# Patient Record
Sex: Male | Born: 1960 | Race: Black or African American | Hispanic: No | Marital: Married | Smoking: Current some day smoker
Health system: Southern US, Community
[De-identification: ages and names within clinical notes are randomized; demographics above are authoritative.]

## PROBLEM LIST (undated history)

## (undated) DIAGNOSIS — I739 Peripheral vascular disease, unspecified: Secondary | ICD-10-CM

## (undated) DIAGNOSIS — T8859XA Other complications of anesthesia, initial encounter: Secondary | ICD-10-CM

## (undated) DIAGNOSIS — R112 Nausea with vomiting, unspecified: Secondary | ICD-10-CM

## (undated) DIAGNOSIS — Z9889 Other specified postprocedural states: Secondary | ICD-10-CM

## (undated) DIAGNOSIS — I10 Essential (primary) hypertension: Secondary | ICD-10-CM

## (undated) DIAGNOSIS — I2510 Atherosclerotic heart disease of native coronary artery without angina pectoris: Secondary | ICD-10-CM

## (undated) DIAGNOSIS — G43909 Migraine, unspecified, not intractable, without status migrainosus: Secondary | ICD-10-CM

## (undated) DIAGNOSIS — E7800 Pure hypercholesterolemia, unspecified: Secondary | ICD-10-CM

## (undated) DIAGNOSIS — E785 Hyperlipidemia, unspecified: Secondary | ICD-10-CM

## (undated) HISTORY — DX: Hyperlipidemia, unspecified: E78.5

## (undated) HISTORY — DX: Essential (primary) hypertension: I10

---

## 2000-11-26 HISTORY — PX: CORONARY ARTERY BYPASS GRAFT: SHX141

## 2001-01-24 ENCOUNTER — Encounter: Payer: Self-pay | Admitting: Cardiology

## 2001-01-24 ENCOUNTER — Encounter: Admission: RE | Admit: 2001-01-24 | Discharge: 2001-01-24 | Payer: Self-pay | Admitting: Cardiology

## 2001-01-28 ENCOUNTER — Inpatient Hospital Stay (HOSPITAL_COMMUNITY): Admission: RE | Admit: 2001-01-28 | Discharge: 2001-02-02 | Payer: Self-pay | Admitting: Cardiology

## 2001-01-29 ENCOUNTER — Encounter: Payer: Self-pay | Admitting: Cardiothoracic Surgery

## 2001-01-30 ENCOUNTER — Encounter: Payer: Self-pay | Admitting: Cardiothoracic Surgery

## 2001-01-31 ENCOUNTER — Encounter: Payer: Self-pay | Admitting: Cardiothoracic Surgery

## 2001-02-28 ENCOUNTER — Encounter: Admission: RE | Admit: 2001-02-28 | Discharge: 2001-02-28 | Payer: Self-pay | Admitting: Cardiothoracic Surgery

## 2001-02-28 ENCOUNTER — Encounter: Payer: Self-pay | Admitting: Cardiothoracic Surgery

## 2001-04-14 ENCOUNTER — Encounter: Admission: RE | Admit: 2001-04-14 | Discharge: 2001-04-14 | Payer: Self-pay

## 2008-11-26 HISTORY — PX: ACHILLES TENDON REPAIR: SUR1153

## 2015-11-01 DIAGNOSIS — N529 Male erectile dysfunction, unspecified: Secondary | ICD-10-CM | POA: Insufficient documentation

## 2015-11-01 DIAGNOSIS — I2510 Atherosclerotic heart disease of native coronary artery without angina pectoris: Secondary | ICD-10-CM | POA: Insufficient documentation

## 2015-11-01 DIAGNOSIS — Z951 Presence of aortocoronary bypass graft: Secondary | ICD-10-CM

## 2015-11-01 DIAGNOSIS — E785 Hyperlipidemia, unspecified: Secondary | ICD-10-CM

## 2015-11-01 HISTORY — DX: Male erectile dysfunction, unspecified: N52.9

## 2015-11-01 HISTORY — DX: Presence of aortocoronary bypass graft: Z95.1

## 2015-11-01 HISTORY — DX: Atherosclerotic heart disease of native coronary artery without angina pectoris: I25.10

## 2015-11-01 HISTORY — DX: Hyperlipidemia, unspecified: E78.5

## 2017-06-24 ENCOUNTER — Ambulatory Visit (INDEPENDENT_AMBULATORY_CARE_PROVIDER_SITE_OTHER): Payer: Self-pay | Admitting: Cardiology

## 2017-06-24 ENCOUNTER — Encounter: Payer: Self-pay | Admitting: Cardiology

## 2017-06-24 VITALS — BP 126/76 | HR 60 | Resp 12 | Ht 71.0 in | Wt 212.0 lb

## 2017-06-24 DIAGNOSIS — N529 Male erectile dysfunction, unspecified: Secondary | ICD-10-CM | POA: Diagnosis not present

## 2017-06-24 DIAGNOSIS — I2510 Atherosclerotic heart disease of native coronary artery without angina pectoris: Secondary | ICD-10-CM

## 2017-06-24 DIAGNOSIS — Z951 Presence of aortocoronary bypass graft: Secondary | ICD-10-CM

## 2017-06-24 DIAGNOSIS — E785 Hyperlipidemia, unspecified: Secondary | ICD-10-CM

## 2017-06-24 NOTE — Patient Instructions (Signed)
Medication Instructions:  Your physician recommends that you continue on your current medications as directed. Please refer to the Current Medication list given to you today.  Labwork: Your physician recommends that you have labwork today: lipids  Testing/Procedures: EKG today in office.   Follow-Up: Your physician recommends that you schedule a follow-up appointment in: 6 months with Dr. Bing MatterKrasowski.   Any Other Special Instructions Will Be Listed Below (If Applicable).     If you need a refill on your cardiac medications before your next appointment, please call your pharmacy.

## 2017-06-24 NOTE — Progress Notes (Signed)
Cardiology Office Note:    Date:  06/24/2017   ID:  Steven Ford, DOB 03/29/1961, MRN [id number]  PCP:  No primary care provider on file.  Cardiologist:  Gypsy Balsamobert Krasowski, MD    Referring MD: No ref. provider found   Chief Complaint  Patient presents with  . Follow-up  Follow-up on coronary artery disease, chief complaint is doing well but I need my dOT.  History of Present Illness:    Steven Ford is a 56 y.o. male  with premature coronary artery disease. Overall stable cardiac-wise doing well. Still exercises in the regular basis about 4 times a week and doing well. No chest pain no tightness no pressurein the burning in the chest. He is married happily to his wife [name].  Past Medical History:  Diagnosis Date  . Hyperlipidemia   . Hypertension     Past Surgical History:  Procedure Laterality Date  . CORONARY ARTERY BYPASS GRAFT      Current Medications: Current Meds  Medication Sig  . aspirin EC 81 MG tablet Take 81 mg by mouth daily.  . clotrimazole-betamethasone (LOTRISONE) cream Apply 1 application topically 2 (two) times daily.  Marland Kitchen. lisinopril (PRINIVIL,ZESTRIL) 5 MG tablet Take 5 mg by mouth daily.  . metoprolol succinate (TOPROL-XL) 25 MG 24 hr tablet Take 1 tablet by mouth daily.  . simvastatin (ZOCOR) 40 MG tablet Take 1 tablet by mouth daily.     Allergies:   Influenza vaccines   Social History   Social History  . Marital status: Single    Spouse name: N/A  . Number of children: N/A  . Years of education: N/A   Social History Main Topics  . Smoking status: Current Some Day Smoker    Types: Cigars  . Smokeless tobacco: Never Used  . Alcohol use Yes  . Drug use: No  . Sexual activity: Not Asked   Other Topics Concern  . None   Social History Narrative  . None     Family History: The patient's family history includes Anemia in his father; Heart disease in his mother. ROS:   Please see the history of present illness.    All 14  point review of systems negative except as described per history of present illness  EKGs/Labs/Other Studies Reviewed:      Recent Labs: No results found for requested labs within last 8760 hours.  Recent Lipid Panel No results found for: CHOL, TRIG, HDL, CHOLHDL, VLDL, LDLCALC, LDLDIRECT  Physical Exam:    VS:  BP 126/76   Pulse 60   Resp 12   Ht 5\' 11"  (1.803 m)   Wt 212 lb (96.2 kg)   BMI 29.57 kg/m     Wt Readings from Last 3 Encounters:  06/24/17 212 lb (96.2 kg)     GEN:  Well nourished, well developed in no acute distress HEENT: Normal NECK: No JVD; No carotid bruits LYMPHATICS: No lymphadenopathy CARDIAC: RRR, no murmurs, no rubs, no gallops RESPIRATORY:  Clear to auscultation without rales, wheezing or rhonchi  ABDOMEN: Soft, non-tender, non-distended MUSCULOSKELETAL:  No edema; No deformity  SKIN: Warm and dry LOWER EXTREMITIES: no swelling NEUROLOGIC:  Alert and oriented x 3 PSYCHIATRIC:  Normal affect   ASSESSMENT:    1. Coronary artery disease involving native coronary artery of native heart without angina pectoris   2. Dyslipidemia   3. Status post coronary artery bypass graft   4. Vasculogenic erectile dysfunction, unspecified vasculogenic erectile dysfunction type    PLAN:  In order of problems listed above:  1. Coronary artery disease: Stable from that point review. We'll continue present management. I encouraged him to continue exercising. 2. Dyslipidemia: We will check his fasting lipid profile. 3. Assessment coronary artery bypass graft. Stable. 4. ENT: Denies having complained. 5. DOT drivers license I told him that he needs to find out exactly what is the procedure for his situation to be accepted. I suspect we will be forced to the stress test.   Medication Adjustments/Labs and Tests Ordered: Current medicines are reviewed at length with the patient today.  Concerns regarding medicines are outlined above.  No orders of the defined  types were placed in this encounter.  Medication changes: No orders of the defined types were placed in this encounter.   Signed, Georgeanna Leaobert J. Krasowski, MD, [facility] 06/24/2017 9:38 AM     [facility]

## 2017-06-25 ENCOUNTER — Other Ambulatory Visit: Payer: Self-pay

## 2017-06-25 DIAGNOSIS — E785 Hyperlipidemia, unspecified: Secondary | ICD-10-CM

## 2017-06-25 LAB — LIPID PANEL
CHOLESTEROL TOTAL: 177 mg/dL (ref 100–199)
Chol/HDL Ratio: 4.7 ratio (ref 0.0–5.0)
HDL: 38 mg/dL — ABNORMAL LOW (ref >39)
LDL CALC: 125 mg/dL — AB (ref 0–99)
Triglycerides: 69 mg/dL (ref 0–149)
VLDL CHOLESTEROL CAL: 14 mg/dL (ref 5–40)

## 2017-06-25 MED ORDER — ROSUVASTATIN CALCIUM 40 MG PO TABS: 40.0000 mg | ORAL_TABLET | Freq: Every day | ORAL | 6 refills | Status: DC

## 2017-09-04 ENCOUNTER — Other Ambulatory Visit: Payer: Self-pay

## 2017-09-04 MED ORDER — LISINOPRIL 5 MG PO TABS: 5.0000 mg | ORAL_TABLET | Freq: Every day | ORAL | 11 refills | Status: DC

## 2017-11-12 ENCOUNTER — Telehealth: Payer: Self-pay

## 2017-11-12 ENCOUNTER — Telehealth: Payer: Self-pay | Admitting: Cardiology

## 2017-11-12 NOTE — Telephone Encounter (Signed)
Patient called stating that he was supposed to have medical clearance for his knee procedure. Patient stated that he does cycling class for 1 hour, weight lifting and works out 4-5 times a week. While working out he does not experience any chest discomfort. Per Dr. Bing MatterKrasowski he is cleared for surgery and does not stop the 81 mg of aspirin.

## 2017-11-12 NOTE — Telephone Encounter (Signed)
Called Cordelia PenSherry back and let her know that when his clearance form was faxed to us originally and message was left form him to schedule and appointment.

## 2017-11-12 NOTE — Telephone Encounter (Signed)
States a pre op clearance form was faxed in October and they have not received it back and his surgery is tomorrow

## 2017-11-26 HISTORY — PX: KNEE ARTHROSCOPY: SHX127

## 2018-07-01 ENCOUNTER — Other Ambulatory Visit: Payer: Self-pay

## 2018-07-01 ENCOUNTER — Telehealth: Payer: Self-pay | Admitting: Cardiology

## 2018-07-01 MED ORDER — METOPROLOL SUCCINATE ER 25 MG PO TB24: 25.0000 mg | ORAL_TABLET | Freq: Every day | ORAL | 0 refills | Status: DC

## 2018-07-01 NOTE — Telephone Encounter (Signed)
Med refill has now been sent

## 2018-07-01 NOTE — Telephone Encounter (Signed)
°  Patient is out of medicine   1. Which medications need to be refilled? (please list name of each medication and dose if known) metoprolol 25mg   2. Which pharmacy/location (including street and city if local pharmacy) is medication to be sent to?[facility] on [address]  3. Do they need a 30 day or 90 day supply? 90 day supply

## 2018-07-09 ENCOUNTER — Telehealth: Payer: Self-pay | Admitting: Podiatry

## 2018-07-09 ENCOUNTER — Ambulatory Visit: Payer: BC Managed Care – PPO | Admitting: Podiatry

## 2018-07-09 ENCOUNTER — Other Ambulatory Visit: Payer: Self-pay | Admitting: Podiatry

## 2018-07-09 ENCOUNTER — Encounter: Payer: Self-pay | Admitting: Podiatry

## 2018-07-09 ENCOUNTER — Ambulatory Visit (INDEPENDENT_AMBULATORY_CARE_PROVIDER_SITE_OTHER): Payer: BC Managed Care – PPO

## 2018-07-09 VITALS — BP 143/80 | HR 51

## 2018-07-09 DIAGNOSIS — I739 Peripheral vascular disease, unspecified: Secondary | ICD-10-CM | POA: Diagnosis not present

## 2018-07-09 DIAGNOSIS — M79672 Pain in left foot: Secondary | ICD-10-CM

## 2018-07-09 DIAGNOSIS — M779 Enthesopathy, unspecified: Secondary | ICD-10-CM

## 2018-07-09 NOTE — Telephone Encounter (Signed)
This is Shanda BumpsJessica with [facility]. Mr. Steven Ford had just called our office. He is a pt of Dr. Vanetta ShawlKrasowski's but saw Dr. Charlsie Merlesegal today. Dr. Charlsie Merlesegal is wanting him to see a vascular doctor and the pt is not understanding as to who exactly he needs to see in our office. If you would please call me back at [phone] and you can ask for [facility]. I would greatly appreciate it. Thank you.

## 2018-07-09 NOTE — Telephone Encounter (Signed)
Spoke with Shanda BumpsJessica at [facility], who informed me that patient is currently in an established patient with Dr. Bing MatterKrasowski.  Patient currently has an appointment to see Dr. Bing MatterKrasowski on 07/15/2018.  I informed her that we would like for Dr. Bing MatterKrasowski to consult with patient regarding abnormal in office ABI results; left ABI PAD, right ABI 1.05.  Copy of ABI report faxed to Dr. Vanetta ShawlKrasowski's office at fax number [phone], with a request to consult with patient and discuss further testing if indicated.

## 2018-07-09 NOTE — Progress Notes (Signed)
Subjective:   Patient ID: Steven Ford, male   DOB: 57 y.o.   MRN: [id number]   HPI Patient states of been having some problems for about a year and is in my foot and right lower leg with patient stating that at times it goes numb it hurts and at times he develops cramping.  Patient does not smoke currently and likes to be active   Review of Systems  All other systems reviewed and are negative.       Objective:  Physical Exam  Constitutional: He appears well-developed and well-nourished.  Cardiovascular: Intact distal pulses.  Pulmonary/Chest: Effort normal.  Musculoskeletal: Normal range of motion.  Neurological: He is alert.  Skin: Skin is warm.  Nursing note and vitals reviewed.   Neurological status was found to be intact with patient on her normal pulses on the right side but no pulses on the left side currently.  Patient has coolness to the feet but no breakdown of tissue and just has generalized discomfort associated with this which is gradually getting worse and especially recently.  Patient has a history of coronary artery disease with bypass     Assessment:  Strong probability for vascular issue with probability for some form of blockage of the left lower leg     Plan:  H&P x-ray reviewed and I did do ABIs indicating abnormal left function.  I am sending to interventional cardiology for evaluation with possibility for interventional treatment and patient also may require bypass and I explained all this to the patient  X-ray indicates that there is no indications of arthritis and I did not note any calcifications of distal vessels

## 2018-07-15 ENCOUNTER — Ambulatory Visit: Payer: BC Managed Care – PPO | Admitting: Cardiology

## 2018-07-15 ENCOUNTER — Encounter: Payer: Self-pay | Admitting: Cardiology

## 2018-07-15 VITALS — BP 138/76 | HR 54 | Ht 71.0 in | Wt 203.0 lb

## 2018-07-15 DIAGNOSIS — I2510 Atherosclerotic heart disease of native coronary artery without angina pectoris: Secondary | ICD-10-CM

## 2018-07-15 DIAGNOSIS — Z951 Presence of aortocoronary bypass graft: Secondary | ICD-10-CM

## 2018-07-15 DIAGNOSIS — I739 Peripheral vascular disease, unspecified: Secondary | ICD-10-CM

## 2018-07-15 DIAGNOSIS — E785 Hyperlipidemia, unspecified: Secondary | ICD-10-CM | POA: Diagnosis not present

## 2018-07-15 HISTORY — DX: Peripheral vascular disease, unspecified: I73.9

## 2018-07-15 LAB — HEPATIC FUNCTION PANEL
ALT: 22 IU/L (ref 0–44)
AST: 23 IU/L (ref 0–40)
Albumin: 4.5 g/dL (ref 3.5–5.5)
Alkaline Phosphatase: 99 IU/L (ref 39–117)
Bilirubin Total: 0.3 mg/dL (ref 0.0–1.2)
Bilirubin, Direct: 0.1 mg/dL (ref 0.00–0.40)
Total Protein: 7 g/dL (ref 6.0–8.5)

## 2018-07-15 LAB — LIPID PANEL
CHOLESTEROL TOTAL: 203 mg/dL — AB (ref 100–199)
Chol/HDL Ratio: 4.5 ratio (ref 0.0–5.0)
HDL: 45 mg/dL (ref >39)
LDL Calculated: 147 mg/dL — ABNORMAL HIGH (ref 0–99)
Triglycerides: 54 mg/dL (ref 0–149)
VLDL CHOLESTEROL CAL: 11 mg/dL (ref 5–40)

## 2018-07-15 NOTE — Patient Instructions (Signed)
Medication Instructions:  Your physician recommends that you continue on your current medications as directed. Please refer to the Current Medication list given to you today.  Labwork: Your physician recommends that you have the following labs drawn: liver and lipid panel  Testing/Procedures: Your physician has requested that you have a carotid duplex. This test is an ultrasound of the carotid arteries in your neck. It looks at blood flow through these arteries that supply the brain with blood. Allow one hour for this exam. There are no restrictions or special instructions.  Your physician has requested that you have a lower or upper extremity arterial duplex. This test is an ultrasound of the arteries in the legs or arms. It looks at arterial blood flow in the legs and arms. Allow one hour for Lower and Upper Arterial scans. There are no restrictions or special instructions.  Follow-Up: Your physician recommends that you schedule a follow-up appointment in: 2 months  Any Other Special Instructions Will Be Listed Below (If Applicable).     If you need a refill on your cardiac medications before your next appointment, please call your pharmacy.   [facility]  Garey HamAshley A, RN, BSN

## 2018-07-15 NOTE — Progress Notes (Signed)
Cardiology Office Note:    Date:  07/15/2018   ID:  Steven Ford, DOB 02/22/1961, MRN [id number]  PCP:  Patient, No Pcp Per  Cardiologist:  Gypsy Balsamobert Krasowski, MD    Referring MD: No ref. provider found   No chief complaint on file. I have pain in your legs  History of Present Illness:    Steven Ford is a 57 y.o. male with coronary artery disease status post coronary artery bypass graft.  Seems to be doing well from cardiac standpoint review however recently he started experiencing pain in especially his left calf while exercising but it started about 6 months ago.  He also described to have some numbness in the his leg.  He eventually not going to podiatrist podiatrist did ABI and apparently ABIs abnormal the left side.  He came to us for a follow-up assessment of this problem.  Overall he is very active he exercised on the regular basis have no difficulty doing it except now complaining of having left calf pain no chest pain tightness squeezing pressure burning chest he was taking some Sudafed recently for some sinus problem and he described to 5 feel fastest heart rate at that time.  Now everything returned to baseline and normal  Past Medical History:  Diagnosis Date  . Hyperlipidemia   . Hypertension     Past Surgical History:  Procedure Laterality Date  . CORONARY ARTERY BYPASS GRAFT    . KNEE SURGERY      Current Medications: Current Meds  Medication Sig  . aspirin EC 81 MG tablet Take 81 mg by mouth daily.  Marland Kitchen. lisinopril (PRINIVIL,ZESTRIL) 5 MG tablet Take 1 tablet (5 mg total) by mouth daily.  . meloxicam (MOBIC) 15 MG tablet Take 1 tablet by mouth as needed.  . metoprolol succinate (TOPROL-XL) 25 MG 24 hr tablet Take 1 tablet (25 mg total) by mouth daily.     Allergies:   Influenza vaccines   Social History   Socioeconomic History  . Marital status: Single    Spouse name: Not on file  . Number of children: Not on file  . Years of education: Not on  file  . Highest education level: Not on file  Occupational History  . Not on file  Social Needs  . Financial resource strain: Not on file  . Food insecurity:    Worry: Not on file    Inability: Not on file  . Transportation needs:    Medical: Not on file    Non-medical: Not on file  Tobacco Use  . Smoking status: Current Some Day Smoker    Types: Cigars  . Smokeless tobacco: Never Used  Substance and Sexual Activity  . Alcohol use: Yes  . Drug use: No  . Sexual activity: Not on file  Lifestyle  . Physical activity:    Days per week: Not on file    Minutes per session: Not on file  . Stress: Not on file  Relationships  . Social connections:    Talks on phone: Not on file    Gets together: Not on file    Attends religious service: Not on file    Active member of club or organization: Not on file    Attends meetings of clubs or organizations: Not on file    Relationship status: Not on file  Other Topics Concern  . Not on file  Social History Narrative  . Not on file     Family History: The patient's  family history includes Anemia in his father; Heart disease in his mother. ROS:   Please see the history of present illness.    All 14 point review of systems negative except as described per history of present illness  EKGs/Labs/Other Studies Reviewed:    EKG showed sinus bradycardia rate 54 first-degree AV block with PR interval of 2060 ms normal QS complex duration morphology nonspecific ST segment changes in form of asymmetrical T inversion in lead V3 to V5.  Recent Labs: No results found for requested labs within last 8760 hours.  Recent Lipid Panel    Component Value Date/Time   CHOL 177 06/24/2017 1017   TRIG 69 06/24/2017 1017   HDL 38 (L) 06/24/2017 1017   CHOLHDL 4.7 06/24/2017 1017   LDLCALC 125 (H) 06/24/2017 1017    Physical Exam:    VS:  BP 138/76 (BP Location: Right Arm, Patient Position: Sitting, Cuff Size: Normal)   Pulse (!) 54   Ht 5\' 11"   (1.803 m)   Wt 203 lb (92.1 kg)   SpO2 95%   BMI 28.31 kg/m     Wt Readings from Last 3 Encounters:  07/15/18 203 lb (92.1 kg)  06/24/17 212 lb (96.2 kg)     GEN:  Well nourished, well developed in no acute distress HEENT: Normal NECK: No JVD; No carotid bruits LYMPHATICS: No lymphadenopathy CARDIAC: RRR, no murmurs, no rubs, no gallops RESPIRATORY:  Clear to auscultation without rales, wheezing or rhonchi  ABDOMEN: Soft, non-tender, non-distended MUSCULOSKELETAL:  No edema; No deformity  SKIN: Warm and dry LOWER EXTREMITIES: no swelling NEUROLOGIC:  Alert and oriented x 3 PSYCHIATRIC:  Normal affect   ASSESSMENT:    1. Coronary artery disease involving native coronary artery of native heart without angina pectoris   2. Dyslipidemia   3. Status post coronary artery bypass graft   4. Claudication in peripheral vascular disease (HCC)    PLAN:    In order of problems listed above:  1. Coronary artery disease doing well from that point review and appropriate medications which I will continue. 2. Dyslipidemia on high intensity statin, I will ask him to have his fasting lipid profile done. 3. Status post coronary artery bypass graft doing well from that point review. 4. Peripheral vascular disease with claudications.  I will ask him to have arterial duplex of lower extremities as well as carotid ultrasound.  See him back in my office in about 2 months sooner if he get a problem   Medication Adjustments/Labs and Tests Ordered: Current medicines are reviewed at length with the patient today.  Concerns regarding medicines are outlined above.  No orders of the defined types were placed in this encounter.  Medication changes: No orders of the defined types were placed in this encounter.   Signed, Georgeanna Leaobert J. Krasowski, MD, [facility] 07/15/2018 9:07 AM    [facility]

## 2018-07-16 ENCOUNTER — Other Ambulatory Visit: Payer: Self-pay

## 2018-07-16 ENCOUNTER — Telehealth: Payer: Self-pay

## 2018-07-16 DIAGNOSIS — E785 Hyperlipidemia, unspecified: Secondary | ICD-10-CM

## 2018-07-16 MED ORDER — ROSUVASTATIN CALCIUM 40 MG PO TABS: 40.0000 mg | ORAL_TABLET | Freq: Every day | ORAL | 3 refills | Status: DC

## 2018-07-16 NOTE — Telephone Encounter (Signed)
Left voicemail for the patient to call the office regarding labs. 

## 2018-07-17 ENCOUNTER — Ambulatory Visit (HOSPITAL_BASED_OUTPATIENT_CLINIC_OR_DEPARTMENT_OTHER)
Admission: RE | Admit: 2018-07-17 | Discharge: 2018-07-17 | Disposition: A | Discharge status: Home / Self Care | Payer: BC Managed Care – PPO | Source: Ambulatory Visit | Attending: Cardiology | Admitting: Cardiology

## 2018-07-17 DIAGNOSIS — I2510 Atherosclerotic heart disease of native coronary artery without angina pectoris: Secondary | ICD-10-CM

## 2018-07-17 DIAGNOSIS — I739 Peripheral vascular disease, unspecified: Secondary | ICD-10-CM

## 2018-07-17 NOTE — Progress Notes (Signed)
VAS US CAROTID DUPLEX PERFORMED   Velocities in the right ICA are consistent with a 1-39% stenosis. The ECA appears >50% stenosed.   Velocities in the left ICA are consistent with a 1-39% stenosis.   Bilateral vertebral arteries demonstrate antegrade flow. Normal flow hemodynamics were seen in bilateral subclavian arteries.    07/17/18 Steven Ford

## 2018-07-18 ENCOUNTER — Encounter (HOSPITAL_COMMUNITY): Payer: BC Managed Care – PPO

## 2018-07-18 ENCOUNTER — Telehealth: Payer: Self-pay

## 2018-07-18 DIAGNOSIS — I739 Peripheral vascular disease, unspecified: Secondary | ICD-10-CM

## 2018-07-18 NOTE — Telephone Encounter (Signed)
-----   Message from Georgeanna Leaobert J Krasowski, MD sent at 07/17/2018  8:36 PM EDT ----- Critical stenosis on the lt side - needs appointement with dr Erlene QuanJ Berry

## 2018-07-18 NOTE — Telephone Encounter (Signed)
Patient notified of left critical stenosis of left leg.  Patient will be contacted for appointment with Dr Allyson SabalBerry. Patient verbalized understanding.

## 2018-07-21 ENCOUNTER — Ambulatory Visit (HOSPITAL_BASED_OUTPATIENT_CLINIC_OR_DEPARTMENT_OTHER): Payer: BC Managed Care – PPO

## 2018-07-21 ENCOUNTER — Encounter (HOSPITAL_BASED_OUTPATIENT_CLINIC_OR_DEPARTMENT_OTHER): Payer: BC Managed Care – PPO

## 2018-07-23 ENCOUNTER — Other Ambulatory Visit: Payer: Self-pay | Admitting: Cardiology

## 2018-08-26 ENCOUNTER — Encounter

## 2018-08-26 ENCOUNTER — Encounter: Payer: Self-pay | Admitting: Cardiovascular Disease

## 2018-08-26 ENCOUNTER — Ambulatory Visit (INDEPENDENT_AMBULATORY_CARE_PROVIDER_SITE_OTHER): Payer: BC Managed Care – PPO | Admitting: Cardiovascular Disease

## 2018-08-26 VITALS — BP 132/78 | HR 58 | Ht 70.0 in | Wt 204.6 lb

## 2018-08-26 DIAGNOSIS — I739 Peripheral vascular disease, unspecified: Secondary | ICD-10-CM | POA: Diagnosis not present

## 2018-08-26 DIAGNOSIS — Z01812 Encounter for preprocedural laboratory examination: Secondary | ICD-10-CM

## 2018-08-26 MED ORDER — METOPROLOL SUCCINATE ER 25 MG PO TB24: 25.0000 mg | ORAL_TABLET | Freq: Every day | ORAL | 1 refills | Status: DC

## 2018-08-26 MED ORDER — LISINOPRIL 5 MG PO TABS: 5.0000 mg | ORAL_TABLET | Freq: Every day | ORAL | 11 refills | Status: DC

## 2018-08-26 NOTE — H&P (View-Only) (Signed)
08/26/2018 Steven Ford   23-Jun-1961  [id number]  Primary Physician Patient, No Pcp Per Primary Cardiologist: Runell Gess MD Roseanne Reno  HPI:  Steven Ford is a 57 y.o. appearing married African-American male father for, grandfather of 2 grandchildren referred by Dr. Bing Matter for peripheral vascular evaluation because of lifestyle limiting claudication.  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  His risk factors include treated hypertension hyperlipidemia.  Mother did have a myocardial infarction.  He had bypass grafting x3 in 2002 and is been asymptomatic from this since.  He works out 3-4 times a week doing cardio and bicycle riding and is done this since his bypass surgery.  Over the last year he is noticed increasing left lower extremity claudication which is lifestyle limiting with recent Dopplers that showed a high-frequency signal in his distal left SFA.   No outpatient medications have been marked as taking for the 08/26/18 encounter (Office Visit) with Runell Gess, MD.     Allergies  Allergen Reactions  . Influenza Vaccines     Social History   Socioeconomic History  . Marital status: Married    Spouse name: Not on file  . Number of children: Not on file  . Years of education: Not on file  . Highest education level: Not on file  Occupational History  . Not on file  Social Needs  . Financial resource strain: Not on file  . Food insecurity:    Worry: Not on file    Inability: Not on file  . Transportation needs:    Medical: Not on file    Non-medical: Not on file  Tobacco Use  . Smoking status: Light Tobacco Smoker    Types: Cigars  . Smokeless tobacco: Never Used  Substance and Sexual Activity  . Alcohol use: Yes  . Drug use: No  . Sexual activity: Not on file  Lifestyle  . Physical activity:    Days per week: Not on file    Minutes per session: Not on file  . Stress: Not on file  Relationships   . Social connections:    Talks on phone: Not on file    Gets together: Not on file    Attends religious service: Not on file    Active member of club or organization: Not on file    Attends meetings of clubs or organizations: Not on file    Relationship status: Not on file  . Intimate partner violence:    Fear of current or ex partner: Not on file    Emotionally abused: Not on file    Physically abused: Not on file    Forced sexual activity: Not on file  Other Topics Concern  . Not on file  Social History Narrative  . Not on file     Review of Systems: General: negative for chills, fever, night sweats or weight changes.  Cardiovascular: negative for chest pain, dyspnea on exertion, edema, orthopnea, palpitations, paroxysmal nocturnal dyspnea or shortness of breath Dermatological: negative for rash Respiratory: negative for cough or wheezing Urologic: negative for hematuria Abdominal: negative for nausea, vomiting, diarrhea, bright red blood per rectum, melena, or hematemesis Neurologic: negative for visual changes, syncope, or dizziness All other systems reviewed and are otherwise negative except as noted above.    Blood pressure 132/78, pulse (!) 58, height 5\' 10"  (1.778 m), weight 204 lb 9.6 oz (92.8 kg), SpO2 97 %.  General appearance: alert  and no distress Neck: no adenopathy, no carotid bruit, no JVD, supple, symmetrical, trachea midline and thyroid not enlarged, symmetric, no tenderness/mass/nodules Lungs: clear to auscultation bilaterally Heart: regular rate and rhythm, S1, S2 normal, no murmur, click, rub or gallop Extremities: extremities normal, atraumatic, no cyanosis or edema Pulses: 2+ and symmetric Skin: Skin color, texture, turgor normal. No rashes or lesions Neurologic: Alert and oriented X 3, normal strength and tone. Normal symmetric reflexes. Normal coordination and gait  EKG not performed today  ASSESSMENT AND PLAN:   Claudication in peripheral  vascular disease [facility]) [name] was referred by Dr. Bing Matter for evaluation of claudication.  He said lifestyle limiting claudication for the last year.  Recent Dopplers performed 07/17/2018 showed a high-frequency signal in his left greater than right SFA.  We will plan on performing angiography potential endovascular therapy of his left SFA for lifestyle limiting claudication.  I explained the potential risk of using paclitaxel with a distant small mortality signal.      Runell Gess MD [facility], [facility] 08/26/2018 3:45 PM

## 2018-08-26 NOTE — Patient Instructions (Signed)
Medication Instructions:  Your physician recommends that you continue on your current medications as directed. Please refer to the Current Medication list given to you today.   Labwork: Your physician recommends that you return for lab work in: TODAY   Testing/Procedures: Dr. Allyson Sabal has ordered a peripheral angiogram to be done at [facility].  This procedure is going to look at the bloodflow in your lower extremities.  If Dr. Allyson Sabal is able to open up the arteries, you will have to spend one night in the hospital.  If he is not able to open the arteries, you will be able to go home that same day.    After the procedure, you will not be allowed to drive for 3 days or push, pull, or lift anything greater than 10 lbs for one week.    Your physician has requested that you have a lower extremity arterial exercise duplex. During this test, exercise and ultrasound are used to evaluate arterial blood flow in the legs. Allow one hour for this exam. There are no restrictions or special instructions. Your physician has requested that you have an ankle brachial index (ABI). During this test an ultrasound and blood pressure cuff are used to evaluate the arteries that supply the arms and legs with blood. Allow thirty minutes for this exam. There are no restrictions or special instructions. SCHEDULE FOR 1 WEEK AFTER 10/3  Follow-Up: Your physician recommends that you schedule a follow-up appointment in: 2 WEEKS FROM 10/3 WITH DR. Allyson Sabal   Any Other Special Instructions Will Be Listed Below (If Applicable).     If you need a refill on your cardiac medications before your next appointment, please call your pharmacy.

## 2018-08-26 NOTE — H&P (View-Only) (Signed)
   08/26/2018 Steven Ford   10/02/1961  [id number]  Primary Physician Patient, No Pcp Per Primary Cardiologist: Dericka Ostenson J Mazikeen Hehn MD FACP, FACC, FAHA, FSCAI  HPI:  Steven Ford is a 57 y.o. appearing married African-American male father for, grandfather of 2 grandchildren referred by Dr. Krasowski for peripheral vascular evaluation because of lifestyle limiting claudication.  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  His risk factors include treated hypertension hyperlipidemia.  Mother did have a myocardial infarction.  He had bypass grafting x3 in 2002 and is been asymptomatic from this since.  He works out 3-4 times a week doing cardio and bicycle riding and is done this since his bypass surgery.  Over the last year he is noticed increasing left lower extremity claudication which is lifestyle limiting with recent Dopplers that showed a high-frequency signal in his distal left SFA.   No outpatient medications have been marked as taking for the 08/26/18 encounter (Office Visit) with Reesa Gotschall J, MD.     Allergies  Allergen Reactions  . Influenza Vaccines     Social History   Socioeconomic History  . Marital status: Married    Spouse name: Not on file  . Number of children: Not on file  . Years of education: Not on file  . Highest education level: Not on file  Occupational History  . Not on file  Social Needs  . Financial resource strain: Not on file  . Food insecurity:    Worry: Not on file    Inability: Not on file  . Transportation needs:    Medical: Not on file    Non-medical: Not on file  Tobacco Use  . Smoking status: Light Tobacco Smoker    Types: Cigars  . Smokeless tobacco: Never Used  Substance and Sexual Activity  . Alcohol use: Yes  . Drug use: No  . Sexual activity: Not on file  Lifestyle  . Physical activity:    Days per week: Not on file    Minutes per session: Not on file  . Stress: Not on file  Relationships   . Social connections:    Talks on phone: Not on file    Gets together: Not on file    Attends religious service: Not on file    Active member of club or organization: Not on file    Attends meetings of clubs or organizations: Not on file    Relationship status: Not on file  . Intimate partner violence:    Fear of current or ex partner: Not on file    Emotionally abused: Not on file    Physically abused: Not on file    Forced sexual activity: Not on file  Other Topics Concern  . Not on file  Social History Narrative  . Not on file     Review of Systems: General: negative for chills, fever, night sweats or weight changes.  Cardiovascular: negative for chest pain, dyspnea on exertion, edema, orthopnea, palpitations, paroxysmal nocturnal dyspnea or shortness of breath Dermatological: negative for rash Respiratory: negative for cough or wheezing Urologic: negative for hematuria Abdominal: negative for nausea, vomiting, diarrhea, bright red blood per rectum, melena, or hematemesis Neurologic: negative for visual changes, syncope, or dizziness All other systems reviewed and are otherwise negative except as noted above.    Blood pressure 132/78, pulse (!) 58, height 5' 10" (1.778 m), weight 204 lb 9.6 oz (92.8 kg), SpO2 97 %.  General appearance: alert   and no distress Neck: no adenopathy, no carotid bruit, no JVD, supple, symmetrical, trachea midline and thyroid not enlarged, symmetric, no tenderness/mass/nodules Lungs: clear to auscultation bilaterally Heart: regular rate and rhythm, S1, S2 normal, no murmur, click, rub or gallop Extremities: extremities normal, atraumatic, no cyanosis or edema Pulses: 2+ and symmetric Skin: Skin color, texture, turgor normal. No rashes or lesions Neurologic: Alert and oriented X 3, normal strength and tone. Normal symmetric reflexes. Normal coordination and gait  EKG not performed today  ASSESSMENT AND PLAN:   Claudication in peripheral  vascular disease (HCC) [name] was referred by Dr. Krasowski for evaluation of claudication.  He said lifestyle limiting claudication for the last year.  Recent Dopplers performed 07/17/2018 showed a high-frequency signal in his left greater than right SFA.  We will plan on performing angiography potential endovascular therapy of his left SFA for lifestyle limiting claudication.  I explained the potential risk of using paclitaxel with a distant small mortality signal.      Nicky Milhouse J. Tadarrius Burch MD FACP,FACC,FAHA, FSCAI 08/26/2018 3:45 PM 

## 2018-08-26 NOTE — Progress Notes (Signed)
   08/26/2018 Steven Ford   07/12/1961  [id number]  Primary Physician Patient, No Pcp Per Primary Cardiologist: Steven J Berry MD FACP, FACC, FAHA, FSCAI  HPI:  Steven Ford is a 57 y.o. appearing married African-American male father for, grandfather of 2 grandchildren referred by Dr. Krasowski for peripheral vascular evaluation because of lifestyle limiting claudication.  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  His risk factors include treated hypertension hyperlipidemia.  Mother did have a myocardial infarction.  He had bypass grafting x3 in 2002 and is been asymptomatic from this since.  He works out 3-4 times a week doing cardio and bicycle riding and is done this since his bypass surgery.  Over the last year he is noticed increasing left lower extremity claudication which is lifestyle limiting with recent Dopplers that showed a high-frequency signal in his distal left SFA.   No outpatient medications have been marked as taking for the 08/26/18 encounter (Office Visit) with Ford, Steven J, MD.     Allergies  Allergen Reactions  . Influenza Vaccines     Social History   Socioeconomic History  . Marital status: Married    Spouse name: Not on file  . Number of children: Not on file  . Years of education: Not on file  . Highest education level: Not on file  Occupational History  . Not on file  Social Needs  . Financial resource strain: Not on file  . Food insecurity:    Worry: Not on file    Inability: Not on file  . Transportation needs:    Medical: Not on file    Non-medical: Not on file  Tobacco Use  . Smoking status: Light Tobacco Smoker    Types: Cigars  . Smokeless tobacco: Never Used  Substance and Sexual Activity  . Alcohol use: Yes  . Drug use: No  . Sexual activity: Not on file  Lifestyle  . Physical activity:    Days per week: Not on file    Minutes per session: Not on file  . Stress: Not on file  Relationships   . Social connections:    Talks on phone: Not on file    Gets together: Not on file    Attends religious service: Not on file    Active member of club or organization: Not on file    Attends meetings of clubs or organizations: Not on file    Relationship status: Not on file  . Intimate partner violence:    Fear of current or ex partner: Not on file    Emotionally abused: Not on file    Physically abused: Not on file    Forced sexual activity: Not on file  Other Topics Concern  . Not on file  Social History Narrative  . Not on file     Review of Systems: General: negative for chills, fever, night sweats or weight changes.  Cardiovascular: negative for chest pain, dyspnea on exertion, edema, orthopnea, palpitations, paroxysmal nocturnal dyspnea or shortness of breath Dermatological: negative for rash Respiratory: negative for cough or wheezing Urologic: negative for hematuria Abdominal: negative for nausea, vomiting, diarrhea, bright red blood per rectum, melena, or hematemesis Neurologic: negative for visual changes, syncope, or dizziness All other systems reviewed and are otherwise negative except as noted above.    Blood pressure 132/78, pulse (!) 58, height 5' 10" (1.778 m), weight 204 lb 9.6 oz (92.8 kg), SpO2 97 %.  General appearance: alert   and no distress Neck: no adenopathy, no carotid bruit, no JVD, supple, symmetrical, trachea midline and thyroid not enlarged, symmetric, no tenderness/mass/nodules Lungs: clear to auscultation bilaterally Heart: regular rate and rhythm, S1, S2 normal, no murmur, click, rub or gallop Extremities: extremities normal, atraumatic, no cyanosis or edema Pulses: 2+ and symmetric Skin: Skin color, texture, turgor normal. No rashes or lesions Neurologic: Alert and oriented X 3, normal strength and tone. Normal symmetric reflexes. Normal coordination and gait  EKG not performed today  ASSESSMENT AND PLAN:   Claudication in peripheral  vascular disease (HCC) [name] was referred by Dr. Krasowski for evaluation of claudication.  He said lifestyle limiting claudication for the last year.  Recent Dopplers performed 07/17/2018 showed a high-frequency signal in his left greater than right SFA.  We will plan on performing angiography potential endovascular therapy of his left SFA for lifestyle limiting claudication.  I explained the potential risk of using paclitaxel with a distant small mortality signal.      Steven J. Berry MD FACP,FACC,FAHA, FSCAI 08/26/2018 3:45 PM 

## 2018-08-26 NOTE — Assessment & Plan Note (Signed)
Steven Ford was referred by Dr. Bing Matter for evaluation of claudication.  He said lifestyle limiting claudication for the last year.  Recent Dopplers performed 07/17/2018 showed a high-frequency signal in his left greater than right SFA.  We will plan on performing angiography potential endovascular therapy of his left SFA for lifestyle limiting claudication.  I explained the potential risk of using paclitaxel with a distant small mortality signal.

## 2018-08-27 ENCOUNTER — Encounter: Payer: Self-pay | Admitting: Cardiovascular Disease

## 2018-08-27 LAB — CBC WITH DIFFERENTIAL/PLATELET
BASOS ABS: 0.1 10*3/uL (ref 0.0–0.2)
BASOS: 1 %
EOS (ABSOLUTE): 0.3 10*3/uL (ref 0.0–0.4)
Eos: 5 %
HEMATOCRIT: 42.6 % (ref 37.5–51.0)
HEMOGLOBIN: 14.4 g/dL (ref 13.0–17.7)
Immature Grans (Abs): 0 10*3/uL (ref 0.0–0.1)
Immature Granulocytes: 0 %
LYMPHS ABS: 2.1 10*3/uL (ref 0.7–3.1)
LYMPHS: 37 %
MCH: 29.9 pg (ref 26.6–33.0)
MCHC: 33.8 g/dL (ref 31.5–35.7)
MCV: 89 fL (ref 79–97)
Monocytes Absolute: 0.6 10*3/uL (ref 0.1–0.9)
Monocytes: 11 %
Neutrophils Absolute: 2.7 10*3/uL (ref 1.4–7.0)
Neutrophils: 46 %
Platelets: 295 10*3/uL (ref 150–450)
RBC: 4.81 x10E6/uL (ref 4.14–5.80)
RDW: 12.9 % (ref 12.3–15.4)
WBC: 5.8 10*3/uL (ref 3.4–10.8)

## 2018-08-27 LAB — BASIC METABOLIC PANEL
BUN / CREAT RATIO: 17 (ref 9–20)
BUN: 15 mg/dL (ref 6–24)
CO2: 24 mmol/L (ref 20–29)
CREATININE: 0.87 mg/dL (ref 0.76–1.27)
Calcium: 9.1 mg/dL (ref 8.7–10.2)
Chloride: 104 mmol/L (ref 96–106)
GFR, EST AFRICAN AMERICAN: 111 mL/min/{1.73_m2} (ref >59)
GFR, EST NON AFRICAN AMERICAN: 96 mL/min/{1.73_m2} (ref >59)
Glucose: 93 mg/dL (ref 65–99)
POTASSIUM: 4.6 mmol/L (ref 3.5–5.2)
SODIUM: 142 mmol/L (ref 134–144)

## 2018-08-27 LAB — TSH: TSH: 2.15 u[IU]/mL (ref 0.450–4.500)

## 2018-08-28 ENCOUNTER — Ambulatory Visit (HOSPITAL_COMMUNITY)
Admission: RE | Admit: 2018-08-28 | Discharge: 2018-08-28 | Disposition: A | Discharge status: Home / Self Care | Payer: BC Managed Care – PPO | Source: Ambulatory Visit | Attending: Cardiovascular Disease | Admitting: Cardiovascular Disease

## 2018-08-28 ENCOUNTER — Other Ambulatory Visit: Payer: Self-pay

## 2018-08-28 ENCOUNTER — Encounter (HOSPITAL_COMMUNITY)
Admission: RE | Disposition: A | Discharge status: Home / Self Care | Payer: Self-pay | Source: Ambulatory Visit | Attending: Cardiovascular Disease

## 2018-08-28 DIAGNOSIS — F17290 Nicotine dependence, other tobacco product, uncomplicated: Secondary | ICD-10-CM | POA: Insufficient documentation

## 2018-08-28 DIAGNOSIS — Z8249 Family history of ischemic heart disease and other diseases of the circulatory system: Secondary | ICD-10-CM | POA: Diagnosis not present

## 2018-08-28 DIAGNOSIS — I10 Essential (primary) hypertension: Secondary | ICD-10-CM | POA: Diagnosis not present

## 2018-08-28 DIAGNOSIS — Z951 Presence of aortocoronary bypass graft: Secondary | ICD-10-CM | POA: Insufficient documentation

## 2018-08-28 DIAGNOSIS — I739 Peripheral vascular disease, unspecified: Secondary | ICD-10-CM

## 2018-08-28 DIAGNOSIS — I70213 Atherosclerosis of native arteries of extremities with intermittent claudication, bilateral legs: Secondary | ICD-10-CM | POA: Diagnosis present

## 2018-08-28 DIAGNOSIS — I70212 Atherosclerosis of native arteries of extremities with intermittent claudication, left leg: Secondary | ICD-10-CM | POA: Diagnosis not present

## 2018-08-28 DIAGNOSIS — E785 Hyperlipidemia, unspecified: Secondary | ICD-10-CM | POA: Insufficient documentation

## 2018-08-28 DIAGNOSIS — Z01812 Encounter for preprocedural laboratory examination: Secondary | ICD-10-CM

## 2018-08-28 DIAGNOSIS — Z887 Allergy status to serum and vaccine status: Secondary | ICD-10-CM | POA: Diagnosis not present

## 2018-08-28 DIAGNOSIS — I70203 Unspecified atherosclerosis of native arteries of extremities, bilateral legs: Secondary | ICD-10-CM | POA: Diagnosis not present

## 2018-08-28 HISTORY — PX: LOWER EXTREMITY ANGIOGRAPHY: CATH118251

## 2018-08-28 SURGERY — LOWER EXTREMITY ANGIOGRAPHY
Anesthesia: LOCAL

## 2018-08-28 MED ORDER — SODIUM CHLORIDE 0.9% FLUSH: 3.0000 mL | Freq: Two times a day (BID) | INTRAVENOUS | Status: DC

## 2018-08-28 MED ORDER — HEPARIN (PORCINE) IN NACL 1000-0.9 UT/500ML-% IV SOLN
INTRAVENOUS | Status: AC
  Filled 2018-08-28: qty 1000

## 2018-08-28 MED ORDER — SODIUM CHLORIDE 0.9 % IV SOLN: 250.0000 mL | INTRAVENOUS | Status: DC | PRN

## 2018-08-28 MED ORDER — HYDRALAZINE HCL 20 MG/ML IJ SOLN
INTRAMUSCULAR | Status: AC
  Filled 2018-08-28: qty 1

## 2018-08-28 MED ORDER — ASPIRIN EC 81 MG PO TBEC
81.0000 mg | DELAYED_RELEASE_TABLET | Freq: Every day | ORAL | Status: DC
  Filled 2018-08-28: qty 1

## 2018-08-28 MED ORDER — MORPHINE SULFATE (PF) 10 MG/ML IV SOLN: 2.0000 mg | INTRAVENOUS | Status: DC | PRN

## 2018-08-28 MED ORDER — HYDRALAZINE HCL 20 MG/ML IJ SOLN
INTRAMUSCULAR | Status: DC | PRN
  Administered 2018-08-28: 10 mg via INTRAVENOUS

## 2018-08-28 MED ORDER — SODIUM CHLORIDE 0.9 % WEIGHT BASED INFUSION: 1.0000 mL/kg/h | INTRAVENOUS | Status: DC

## 2018-08-28 MED ORDER — HYDRALAZINE HCL 20 MG/ML IJ SOLN: 5.0000 mg | INTRAMUSCULAR | Status: DC | PRN

## 2018-08-28 MED ORDER — FENTANYL CITRATE (PF) 100 MCG/2ML IJ SOLN
INTRAMUSCULAR | Status: AC
  Filled 2018-08-28: qty 2

## 2018-08-28 MED ORDER — ASPIRIN 81 MG PO CHEW
81.0000 mg | CHEWABLE_TABLET | ORAL | Status: AC
  Administered 2018-08-28: 81 mg via ORAL
  Filled 2018-08-28: qty 1

## 2018-08-28 MED ORDER — LIDOCAINE HCL (PF) 1 % IJ SOLN
INTRAMUSCULAR | Status: DC | PRN
  Administered 2018-08-28: 15 mL

## 2018-08-28 MED ORDER — SODIUM CHLORIDE 0.9 % WEIGHT BASED INFUSION
3.0000 mL/kg/h | INTRAVENOUS | Status: AC
  Administered 2018-08-28: 3 mL/kg/h via INTRAVENOUS

## 2018-08-28 MED ORDER — ACETAMINOPHEN 325 MG PO TABS: 650.0000 mg | ORAL_TABLET | ORAL | Status: DC | PRN

## 2018-08-28 MED ORDER — SODIUM CHLORIDE 0.9 % IV SOLN: INTRAVENOUS | Status: AC

## 2018-08-28 MED ORDER — IODIXANOL 320 MG/ML IV SOLN
INTRAVENOUS | Status: DC | PRN
  Administered 2018-08-28: 97 mL via INTRA_ARTERIAL

## 2018-08-28 MED ORDER — MIDAZOLAM HCL 2 MG/2ML IJ SOLN
INTRAMUSCULAR | Status: AC
  Filled 2018-08-28: qty 2

## 2018-08-28 MED ORDER — HEPARIN (PORCINE) IN NACL 1000-0.9 UT/500ML-% IV SOLN
INTRAVENOUS | Status: DC | PRN
  Administered 2018-08-28: 500 mL

## 2018-08-28 MED ORDER — ASPIRIN 81 MG PO CHEW: 81.0000 mg | CHEWABLE_TABLET | ORAL | Status: DC

## 2018-08-28 MED ORDER — FENTANYL CITRATE (PF) 100 MCG/2ML IJ SOLN
INTRAMUSCULAR | Status: DC | PRN
  Administered 2018-08-28: 25 ug via INTRAVENOUS

## 2018-08-28 MED ORDER — ONDANSETRON HCL 4 MG/2ML IJ SOLN: 4.0000 mg | Freq: Four times a day (QID) | INTRAMUSCULAR | Status: DC | PRN

## 2018-08-28 MED ORDER — LABETALOL HCL 5 MG/ML IV SOLN: 10.0000 mg | INTRAVENOUS | Status: DC | PRN

## 2018-08-28 MED ORDER — SODIUM CHLORIDE 0.9% FLUSH: 3.0000 mL | INTRAVENOUS | Status: DC | PRN

## 2018-08-28 MED ORDER — LIDOCAINE HCL (PF) 1 % IJ SOLN
INTRAMUSCULAR | Status: AC
  Filled 2018-08-28: qty 30

## 2018-08-28 MED ORDER — MIDAZOLAM HCL 2 MG/2ML IJ SOLN
INTRAMUSCULAR | Status: DC | PRN
  Administered 2018-08-28: 1 mg via INTRAVENOUS

## 2018-08-28 SURGICAL SUPPLY — 11 items
CATH ANGIO 5F PIGTAIL 65CM (CATHETERS) ×2 IMPLANT
KIT PV (KITS) ×2 IMPLANT
SHEATH PINNACLE 5F 10CM (SHEATH) ×2 IMPLANT
SHEATH PROBE COVER 6X72 (BAG) ×2 IMPLANT
STOPCOCK MORSE 400PSI 3WAY (MISCELLANEOUS) ×2 IMPLANT
SYRINGE MEDRAD AVANTA MACH 7 (SYRINGE) ×2 IMPLANT
TRANSDUCER W/STOPCOCK (MISCELLANEOUS) ×2 IMPLANT
TRAY PV CATH (CUSTOM PROCEDURE TRAY) ×2 IMPLANT
TUBING CIL FLEX 10 FLL-RA (TUBING) ×2 IMPLANT
WIRE HITORQ VERSACORE ST 145CM (WIRE) ×2 IMPLANT
WIRE J 3MM .035X145CM (WIRE) ×2 IMPLANT

## 2018-08-28 NOTE — Interval H&P Note (Signed)
History and Physical Interval Note:  08/28/2018 10:09 AM  Steven Ford  has presented today for surgery, with the diagnosis of claudication  The various methods of treatment have been discussed with the patient and family. After consideration of risks, benefits and other options for treatment, the patient has consented to  Procedure(s): LOWER EXTREMITY ANGIOGRAPHY (N/A) as a surgical intervention .  The patient's history has been reviewed, patient examined, no change in status, stable for surgery.  I have reviewed the patient's chart and labs.  Questions were answered to the patient's satisfaction.     Nanetta Batty

## 2018-08-28 NOTE — Progress Notes (Signed)
Site area: Right groin a 5 french arterial sheath was removed  Site Prior to Removal:  Level 0  Pressure Applied For 20 MINUTES    Bedrest Beginning at 1115am  Manual:   Yes.    Patient Status During Pull:  stable  Post Pull Groin Site:  Level 0  Post Pull Instructions Given:  Yes.    Post Pull Pulses Present:  Yes.    Dressing Applied:  Yes.    Comments:  VS remain stable 

## 2018-08-28 NOTE — Discharge Instructions (Signed)

## 2018-08-29 ENCOUNTER — Encounter (HOSPITAL_COMMUNITY): Payer: Self-pay | Admitting: Cardiovascular Disease

## 2018-08-29 ENCOUNTER — Telehealth: Payer: Self-pay | Admitting: Cardiovascular Disease

## 2018-08-29 ENCOUNTER — Telehealth: Payer: Self-pay

## 2018-08-29 NOTE — Telephone Encounter (Signed)
New message:      Pt states he was scheduled to have a procedure on yesterday with Allyson Sabal. Pt states the procedure was cancelled and was told he would get a call back to reschedule for next week. Pt states he would like to get that set up asap.

## 2018-08-29 NOTE — Telephone Encounter (Signed)
Pt procedure rescheduled for Thursday 10/10 at 7:30am with arrival at 5:30am for LE angiogram right groin with Dr. Allyson Sabal. Reps. Minerva Areola and Mellody Dance (both aware). Pt aware, labs completed and reviewed pre-procedure expectations. No additional questions at this time.

## 2018-08-29 NOTE — Telephone Encounter (Signed)
Returned call to pt 09-04-18 @5am  arrive time relayed to pt. Informed pt to follow the same directions as scheduled procedure yesterday. Pt verbalizes understanding. He will be there Thursday and arrive at 5am NPO midnight

## 2018-09-03 ENCOUNTER — Encounter (HOSPITAL_COMMUNITY): Payer: BC Managed Care – PPO

## 2018-09-04 ENCOUNTER — Other Ambulatory Visit: Payer: Self-pay

## 2018-09-04 ENCOUNTER — Encounter (HOSPITAL_COMMUNITY): Payer: Self-pay | Admitting: General Practice

## 2018-09-04 ENCOUNTER — Encounter (HOSPITAL_COMMUNITY)
Admission: RE | Disposition: A | Discharge status: Home / Self Care | Payer: Self-pay | Source: Ambulatory Visit | Attending: Cardiovascular Disease

## 2018-09-04 ENCOUNTER — Ambulatory Visit (HOSPITAL_COMMUNITY)
Admission: RE | Admit: 2018-09-04 | Discharge: 2018-09-05 | Disposition: A | Discharge status: Home / Self Care | Payer: BC Managed Care – PPO | Source: Ambulatory Visit | Attending: Cardiovascular Disease | Admitting: Cardiovascular Disease

## 2018-09-04 DIAGNOSIS — I10 Essential (primary) hypertension: Secondary | ICD-10-CM | POA: Diagnosis not present

## 2018-09-04 DIAGNOSIS — Z887 Allergy status to serum and vaccine status: Secondary | ICD-10-CM | POA: Insufficient documentation

## 2018-09-04 DIAGNOSIS — Z8249 Family history of ischemic heart disease and other diseases of the circulatory system: Secondary | ICD-10-CM | POA: Diagnosis not present

## 2018-09-04 DIAGNOSIS — I70212 Atherosclerosis of native arteries of extremities with intermittent claudication, left leg: Secondary | ICD-10-CM | POA: Insufficient documentation

## 2018-09-04 DIAGNOSIS — F17290 Nicotine dependence, other tobacco product, uncomplicated: Secondary | ICD-10-CM | POA: Diagnosis not present

## 2018-09-04 DIAGNOSIS — E785 Hyperlipidemia, unspecified: Secondary | ICD-10-CM | POA: Insufficient documentation

## 2018-09-04 DIAGNOSIS — I739 Peripheral vascular disease, unspecified: Secondary | ICD-10-CM | POA: Diagnosis present

## 2018-09-04 HISTORY — DX: Peripheral vascular disease, unspecified: I73.9

## 2018-09-04 HISTORY — DX: Pure hypercholesterolemia, unspecified: E78.00

## 2018-09-04 HISTORY — PX: PERIPHERAL VASCULAR INTERVENTION: CATH118257

## 2018-09-04 HISTORY — PX: LOWER EXTREMITY ANGIOGRAPHY: CATH118251

## 2018-09-04 HISTORY — DX: Atherosclerotic heart disease of native coronary artery without angina pectoris: I25.10

## 2018-09-04 HISTORY — DX: Migraine, unspecified, not intractable, without status migrainosus: G43.909

## 2018-09-04 LAB — BASIC METABOLIC PANEL
Anion gap: 8 (ref 5–15)
BUN: 18 mg/dL (ref 6–20)
CALCIUM: 8.6 mg/dL — AB (ref 8.9–10.3)
CO2: 23 mmol/L (ref 22–32)
CREATININE: 0.95 mg/dL (ref 0.61–1.24)
Chloride: 108 mmol/L (ref 98–111)
GFR calc Af Amer: >60 mL/min (ref >60)
GLUCOSE: 108 mg/dL — AB (ref 70–99)
Potassium: 3.7 mmol/L (ref 3.5–5.1)
Sodium: 139 mmol/L (ref 135–145)

## 2018-09-04 LAB — CBC
HCT: 41.9 % (ref 39.0–52.0)
Hemoglobin: 13 g/dL (ref 13.0–17.0)
MCH: 28.7 pg (ref 26.0–34.0)
MCHC: 31 g/dL (ref 30.0–36.0)
MCV: 92.5 fL (ref 80.0–100.0)
PLATELETS: 248 10*3/uL (ref 150–400)
RBC: 4.53 MIL/uL (ref 4.22–5.81)
RDW: 13.2 % (ref 11.5–15.5)
WBC: 5.3 10*3/uL (ref 4.0–10.5)
nRBC: 0 % (ref 0.0–0.2)

## 2018-09-04 LAB — POCT ACTIVATED CLOTTING TIME
Activated Clotting Time: 263 seconds
Activated Clotting Time: 235 seconds
Activated Clotting Time: 246 seconds
Activated Clotting Time: 246 seconds
Activated Clotting Time: 125 seconds

## 2018-09-04 SURGERY — LOWER EXTREMITY ANGIOGRAPHY
Anesthesia: LOCAL

## 2018-09-04 MED ORDER — METOPROLOL SUCCINATE ER 25 MG PO TB24
25.0000 mg | ORAL_TABLET | Freq: Every day | ORAL | Status: DC
  Administered 2018-09-04 – 2018-09-05 (×2): 25 mg via ORAL
  Filled 2018-09-04 (×2): qty 1

## 2018-09-04 MED ORDER — ANGIOPLASTY BOOK
Freq: Once | Status: AC
  Administered 2018-09-04: 18:00:00
  Filled 2018-09-04: qty 1

## 2018-09-04 MED ORDER — MORPHINE SULFATE (PF) 2 MG/ML IV SOLN: 2.0000 mg | INTRAVENOUS | Status: DC | PRN

## 2018-09-04 MED ORDER — NITROGLYCERIN IN D5W 200-5 MCG/ML-% IV SOLN
INTRAVENOUS | Status: AC
  Filled 2018-09-04: qty 250

## 2018-09-04 MED ORDER — ASPIRIN 81 MG PO CHEW: 81.0000 mg | CHEWABLE_TABLET | ORAL | Status: DC

## 2018-09-04 MED ORDER — LIDOCAINE HCL (PF) 1 % IJ SOLN
INTRAMUSCULAR | Status: AC
  Filled 2018-09-04: qty 30

## 2018-09-04 MED ORDER — FENTANYL CITRATE (PF) 100 MCG/2ML IJ SOLN
INTRAMUSCULAR | Status: DC | PRN
  Administered 2018-09-04: 25 ug via INTRAVENOUS

## 2018-09-04 MED ORDER — MIDAZOLAM HCL 2 MG/2ML IJ SOLN
INTRAMUSCULAR | Status: DC | PRN
  Administered 2018-09-04: 1 mg via INTRAVENOUS

## 2018-09-04 MED ORDER — SODIUM CHLORIDE 0.9 % IV SOLN
INTRAVENOUS | Status: AC
  Administered 2018-09-04: 16:00:00 via INTRAVENOUS

## 2018-09-04 MED ORDER — CLOPIDOGREL BISULFATE 300 MG PO TABS
ORAL_TABLET | ORAL | Status: AC
  Filled 2018-09-04: qty 1

## 2018-09-04 MED ORDER — HEPARIN (PORCINE) IN NACL 1000-0.9 UT/500ML-% IV SOLN
INTRAVENOUS | Status: AC
  Filled 2018-09-04: qty 1000

## 2018-09-04 MED ORDER — CLOPIDOGREL BISULFATE 75 MG PO TABS
75.0000 mg | ORAL_TABLET | Freq: Every day | ORAL | Status: DC
  Administered 2018-09-05: 08:00:00 75 mg via ORAL
  Filled 2018-09-04: qty 1

## 2018-09-04 MED ORDER — ONDANSETRON HCL 4 MG/2ML IJ SOLN
4.0000 mg | Freq: Four times a day (QID) | INTRAMUSCULAR | Status: DC | PRN
  Administered 2018-09-04: 4 mg via INTRAVENOUS
  Filled 2018-09-04: qty 2

## 2018-09-04 MED ORDER — ALUM & MAG HYDROXIDE-SIMETH 200-200-20 MG/5ML PO SUSP
30.0000 mL | Freq: Four times a day (QID) | ORAL | Status: DC | PRN
  Administered 2018-09-04: 30 mL via ORAL
  Filled 2018-09-04: qty 30

## 2018-09-04 MED ORDER — FENTANYL CITRATE (PF) 100 MCG/2ML IJ SOLN
INTRAMUSCULAR | Status: AC
  Filled 2018-09-04: qty 2

## 2018-09-04 MED ORDER — SODIUM CHLORIDE 0.9% FLUSH: 3.0000 mL | INTRAVENOUS | Status: DC | PRN

## 2018-09-04 MED ORDER — ROSUVASTATIN CALCIUM 20 MG PO TABS
40.0000 mg | ORAL_TABLET | Freq: Every day | ORAL | Status: DC
  Administered 2018-09-04 – 2018-09-05 (×2): 40 mg via ORAL
  Filled 2018-09-04 (×2): qty 2

## 2018-09-04 MED ORDER — SODIUM CHLORIDE 0.9% FLUSH: 3.0000 mL | Freq: Two times a day (BID) | INTRAVENOUS | Status: DC

## 2018-09-04 MED ORDER — FENTANYL CITRATE (PF) 100 MCG/2ML IJ SOLN
INTRAMUSCULAR | Status: DC | PRN
  Administered 2018-09-04 (×4): 25 ug via INTRAVENOUS

## 2018-09-04 MED ORDER — VERAPAMIL HCL 2.5 MG/ML IV SOLN
INTRAVENOUS | Status: AC
  Filled 2018-09-04: qty 2

## 2018-09-04 MED ORDER — LABETALOL HCL 5 MG/ML IV SOLN: 10.0000 mg | INTRAVENOUS | Status: DC | PRN

## 2018-09-04 MED ORDER — SODIUM CHLORIDE 0.9 % WEIGHT BASED INFUSION: 1.0000 mL/kg/h | INTRAVENOUS | Status: DC

## 2018-09-04 MED ORDER — NITROGLYCERIN 1 MG/10 ML FOR IR/CATH LAB
INTRA_ARTERIAL | Status: DC | PRN
  Administered 2018-09-04 (×4): 200 ug via INTRA_ARTERIAL

## 2018-09-04 MED ORDER — ASPIRIN EC 81 MG PO TBEC
81.0000 mg | DELAYED_RELEASE_TABLET | Freq: Every day | ORAL | Status: DC
  Administered 2018-09-05: 09:00:00 81 mg via ORAL
  Filled 2018-09-04: qty 1

## 2018-09-04 MED ORDER — LISINOPRIL 5 MG PO TABS
5.0000 mg | ORAL_TABLET | Freq: Every day | ORAL | Status: DC
  Administered 2018-09-04 – 2018-09-05 (×2): 5 mg via ORAL
  Filled 2018-09-04 (×2): qty 1

## 2018-09-04 MED ORDER — HYDRALAZINE HCL 20 MG/ML IJ SOLN
5.0000 mg | INTRAMUSCULAR | Status: AC | PRN
  Administered 2018-09-04 (×2): 5 mg via INTRAVENOUS
  Filled 2018-09-04: qty 1

## 2018-09-04 MED ORDER — MIDAZOLAM HCL 2 MG/2ML IJ SOLN
INTRAMUSCULAR | Status: AC
  Filled 2018-09-04: qty 2

## 2018-09-04 MED ORDER — HEPARIN SODIUM (PORCINE) 1000 UNIT/ML IJ SOLN
INTRAMUSCULAR | Status: AC
  Filled 2018-09-04: qty 1

## 2018-09-04 MED ORDER — NITROGLYCERIN 1 MG/10 ML FOR IR/CATH LAB
INTRA_ARTERIAL | Status: AC
  Filled 2018-09-04: qty 30

## 2018-09-04 MED ORDER — LIDOCAINE HCL (PF) 1 % IJ SOLN
INTRAMUSCULAR | Status: DC | PRN
  Administered 2018-09-04: 20 mL via INTRADERMAL

## 2018-09-04 MED ORDER — ACETAMINOPHEN 325 MG PO TABS: 650.0000 mg | ORAL_TABLET | ORAL | Status: DC | PRN

## 2018-09-04 MED ORDER — SODIUM CHLORIDE 0.9 % IV SOLN: 250.0000 mL | INTRAVENOUS | Status: DC | PRN

## 2018-09-04 MED ORDER — IODIXANOL 320 MG/ML IV SOLN
INTRAVENOUS | Status: DC | PRN
  Administered 2018-09-04: 140 mL via INTRA_ARTERIAL

## 2018-09-04 MED ORDER — SODIUM CHLORIDE 0.9 % WEIGHT BASED INFUSION
3.0000 mL/kg/h | INTRAVENOUS | Status: DC
  Administered 2018-09-04: 3 mL/kg/h via INTRAVENOUS

## 2018-09-04 MED ORDER — ASPIRIN EC 81 MG PO TBEC: 81.0000 mg | DELAYED_RELEASE_TABLET | Freq: Every day | ORAL | Status: DC

## 2018-09-04 MED ORDER — SODIUM CHLORIDE 0.9% FLUSH
3.0000 mL | Freq: Two times a day (BID) | INTRAVENOUS | Status: DC
  Administered 2018-09-04: 3 mL via INTRAVENOUS

## 2018-09-04 MED ORDER — CLOPIDOGREL BISULFATE 300 MG PO TABS
ORAL_TABLET | ORAL | Status: DC | PRN
  Administered 2018-09-04: 300 mg via ORAL

## 2018-09-04 MED ORDER — HEPARIN SODIUM (PORCINE) 1000 UNIT/ML IJ SOLN
INTRAMUSCULAR | Status: DC | PRN
  Administered 2018-09-04: 2000 [IU] via INTRAVENOUS
  Administered 2018-09-04: 2500 [IU] via INTRAVENOUS
  Administered 2018-09-04: 9000 [IU] via INTRAVENOUS
  Administered 2018-09-04: 3000 [IU] via INTRAVENOUS
  Administered 2018-09-04: 2000 [IU] via INTRAVENOUS

## 2018-09-04 MED ORDER — HEPARIN (PORCINE) IN NACL 1000-0.9 UT/500ML-% IV SOLN
INTRAVENOUS | Status: DC | PRN
  Administered 2018-09-04 (×2): 500 mL

## 2018-09-04 MED ORDER — VIPERSLIDE LUBRICANT OPTIME
TOPICAL | Status: DC | PRN
  Administered 2018-09-04: 20 mL via SURGICAL_CAVITY

## 2018-09-04 SURGICAL SUPPLY — 37 items
BALLN ADMIRAL INPACT 6X200 (BALLOONS) ×3
BALLN COYOTE OTW 4X220X150 (BALLOONS) ×3
BALLN IN.PACT DCB 6X80 (BALLOONS) ×3
BALLN STERLING OTW 6X100X135 (BALLOONS) ×3
BALLOON ADMIRAL INPACT 6X200 (BALLOONS) ×2 IMPLANT
BALLOON COYOTE OTW 4X220X150 (BALLOONS) ×2 IMPLANT
BALLOON STERLING OTW 6X100X135 (BALLOONS) ×2 IMPLANT
BUR DBK EXCH CART 1.50 SOL145 (BURR) ×2 IMPLANT
BURR DBK EXCH CART 1.50 SOL145 (BURR) ×3
CATH CROSS OVER TEMPO 5F (CATHETERS) ×3 IMPLANT
CATH CXI SUPP ST 2.6FR 150CM (CATHETERS) ×3 IMPLANT
CROWN 2.0 SOLID 145 DIAMONDBK (BURR) ×3 IMPLANT
DCB IN.PACT 6X80 (BALLOONS) ×2 IMPLANT
DEVICE CONTINUOUS FLUSH (MISCELLANEOUS) ×3 IMPLANT
DEVICE EMBOSHIELD NAV6 4.0-7.0 (FILTER) ×3 IMPLANT
DEVICE TORQUE .014-.018 (MISCELLANEOUS) ×2 IMPLANT
GUIDEWIRE ANGLED .035X150CM (WIRE) ×3 IMPLANT
GUIDEWIRE REGALIA .014X300CM (WIRE) ×3 IMPLANT
KIT ENCORE 26 ADVANTAGE (KITS) ×3 IMPLANT
KIT PV (KITS) ×3 IMPLANT
LUBRICANT VIPERSLIDE CORONARY (MISCELLANEOUS) ×3 IMPLANT
SHEATH HIGHFLEX ANSEL 7FR 55CM (SHEATH) ×3 IMPLANT
SHEATH PINNACLE 5F 10CM (SHEATH) ×3 IMPLANT
SHEATH PINNACLE 7F 10CM (SHEATH) ×3 IMPLANT
SHEATH PROBE COVER 6X72 (BAG) ×3 IMPLANT
STENT SUPERA 6.0X150X120 (Permanent Stent) ×3 IMPLANT
STOPCOCK MORSE 400PSI 3WAY (MISCELLANEOUS) ×3 IMPLANT
SYRINGE MEDRAD AVANTA MACH 7 (SYRINGE) ×3 IMPLANT
TAPE VIPERTRACK RADIOPAQ (MISCELLANEOUS) ×2 IMPLANT
TAPE VIPERTRACK RADIOPAQUE (MISCELLANEOUS) ×1
TORQUE DEVICE .014-.018 (MISCELLANEOUS) ×3
TRANSDUCER W/STOPCOCK (MISCELLANEOUS) ×3 IMPLANT
TRAY PV CATH (CUSTOM PROCEDURE TRAY) ×3 IMPLANT
TUBING CIL FLEX 10 FLL-RA (TUBING) ×3 IMPLANT
WIRE HITORQ VERSACORE ST 145CM (WIRE) ×3 IMPLANT
WIRE ROSEN-J .035X180CM (WIRE) ×3 IMPLANT
WIRE VIPER ADVANCE .017X335CM (WIRE) ×3 IMPLANT

## 2018-09-04 NOTE — Interval H&P Note (Signed)
History and Physical Interval Note:  09/04/2018 7:37 AM  Steven Ford  has presented today for surgery, with the diagnosis of claudication  The various methods of treatment have been discussed with the patient and family. After consideration of risks, benefits and other options for treatment, the patient has consented to  Procedure(s): LOWER EXTREMITY ANGIOGRAPHY (N/A) as a surgical intervention .  The patient's history has been reviewed, patient examined, no change in status, stable for surgery.  I have reviewed the patient's chart and labs.  Questions were answered to the patient's satisfaction.     Nanetta Batty

## 2018-09-04 NOTE — Progress Notes (Signed)
Site area: right femoral artery 5F Site Prior to Removal:  Level 1 slight oozing into bandage, small nickel size hematoma superior to sheath insertion site  Pressure Applied For: 25 mins Manual:   yes Patient Status During Pull:  Stable. Patient tolerated well Post Pull Site:  Level 0 Post Pull Instructions Given:  Yes. Patient states understands instructions Post Pull Pulses Present: right DP palpable through sock Dressing Applied:   Sterile tegaderm  Bedrest begins @ 1450 Comments: Patient tolerated well. Vital signs stable

## 2018-09-04 NOTE — Progress Notes (Signed)
Patient urine bloody.  Notified Dr Allyson Sabal, he states bloody urine is from the atherectomy procedure and will clear within 24hrs.  Explained that to the patient and family.

## 2018-09-05 ENCOUNTER — Encounter (HOSPITAL_COMMUNITY): Payer: Self-pay | Admitting: Cardiovascular Disease

## 2018-09-05 DIAGNOSIS — I739 Peripheral vascular disease, unspecified: Secondary | ICD-10-CM | POA: Diagnosis not present

## 2018-09-05 DIAGNOSIS — I70212 Atherosclerosis of native arteries of extremities with intermittent claudication, left leg: Secondary | ICD-10-CM | POA: Diagnosis not present

## 2018-09-05 LAB — BASIC METABOLIC PANEL
Anion gap: 7 (ref 5–15)
BUN: 12 mg/dL (ref 6–20)
CO2: 25 mmol/L (ref 22–32)
CREATININE: 0.97 mg/dL (ref 0.61–1.24)
Calcium: 8.3 mg/dL — ABNORMAL LOW (ref 8.9–10.3)
Chloride: 109 mmol/L (ref 98–111)
Glucose, Bld: 102 mg/dL — ABNORMAL HIGH (ref 70–99)
Potassium: 3.6 mmol/L (ref 3.5–5.1)
SODIUM: 141 mmol/L (ref 135–145)

## 2018-09-05 LAB — CBC
HEMATOCRIT: 40.5 % (ref 39.0–52.0)
HEMOGLOBIN: 12.9 g/dL — AB (ref 13.0–17.0)
MCH: 29 pg (ref 26.0–34.0)
MCHC: 31.9 g/dL (ref 30.0–36.0)
MCV: 91 fL (ref 80.0–100.0)
Platelets: 223 10*3/uL (ref 150–400)
RBC: 4.45 MIL/uL (ref 4.22–5.81)
RDW: 13.3 % (ref 11.5–15.5)
WBC: 7.8 10*3/uL (ref 4.0–10.5)
nRBC: 0 % (ref 0.0–0.2)

## 2018-09-05 LAB — HEPATIC FUNCTION PANEL
ALK PHOS: 60 U/L (ref 38–126)
ALT: 25 U/L (ref 0–44)
AST: 27 U/L (ref 15–41)
Albumin: 3.5 g/dL (ref 3.5–5.0)
Bilirubin, Direct: 0.2 mg/dL (ref 0.0–0.2)
Indirect Bilirubin: 0.6 mg/dL (ref 0.3–0.9)
TOTAL PROTEIN: 5.9 g/dL — AB (ref 6.5–8.1)
Total Bilirubin: 0.8 mg/dL (ref 0.3–1.2)

## 2018-09-05 MED ORDER — CLOPIDOGREL BISULFATE 75 MG PO TABS: 75.0000 mg | ORAL_TABLET | Freq: Every day | ORAL | 11 refills | Status: DC

## 2018-09-05 MED FILL — CLOPIDOGREL 75 MG TABLET: 75 | 30 days supply | Qty: 30 | Fill #0 | Status: TO

## 2018-09-05 NOTE — Discharge Summary (Signed)
Discharge Summary    Patient ID: LEEANDRE Ford MRN: [id number]; DOB: Mar 17, 1961  Admit date: 09/04/2018 Discharge date: 09/05/2018  Primary Care Provider: Patient, No Pcp Per  Primary Cardiologist: Gypsy Balsam, MD  PV:  Dr. Allyson Sabal   Discharge Diagnoses    Active Problems:   Claudication in peripheral vascular disease (HCC)  HTN    HLD  Allergies Allergies  Allergen Reactions  . Influenza Vaccines Other (See Comments)    Pt gets sick or sicker with flu shot     Diagnostic Studies/Procedures    PV Angiogram/Intervention  Procedures Performed:               1.  Placement of a NAV 6 distal protection device in the left below the knee popliteal artery               2.  Diamondback orbital rotational atherectomy left SFA               3.  Drug-coated balloon angioplasty left SFA               4.  Supera stenting left SFA  PROCEDURE DESCRIPTION:   The patient was brought to the second floor [facility] lab in the the postabsorptive state. He was premedicated with IV Versed and fentanyl. His right groin was prepped and shaved in usual sterile fashion. Xylocaine 1% was used for local anesthesia. A 5 French sheath was inserted into the right common femoral artery artery using standard Seldinger technique using ultrasound guidance.  A 5 French crossover catheter, glide wire and Rosen wire were used to obtain contralateral access with a 7 [place] 55 cm multipurpose Ansell sheath.  The patient received 17,500 units of heparin with an ACT of 263.  Total contrast administered to the patient was 140 cc.  Fluoroscopy time was 53 minutes.  I was able to cross the highly calcified subtotally occluded mid left SFA stenosis with an 018 CXI endhole catheter and an 014 [place] wire.  I was unable to pass the NAV 6  filter however and therefore performed orbital atherectomy of the mid left SFA with a 1.5 mm bur.  I then ballooned the entire segment with a 4 mm x 220 mill meter  long Sterling balloon and was able to then deployed the NAV 6 filter in the below the knee popliteal artery.  Following this I performed orbital atherectomy with a 2 mm bur up to 120,000 RPMs administering a copious amount of intra-arterial nitroglycerin.  Following this I placed a 018 wire in a tandem fashion next to the filter and capture the filter.  I then performed drug coated balloon angioplasty with a 6 mm x 200 mm long impact Admiral along with a 6 mm x 80 number long impact Admiral with an excellent angiographic result except for narrowing in the adductor canal because of the high calcium burden.  I then put and then placed a 6 mm x 120 mm long superior stent in the diseased segment and deployed at with standard struck dimensions postdilated with a 6 mm x 100 mm long balloon resulting reduction of a 95 to 90% subtotally occluded calcified segmental mid left SFA stenosis to 0% residual except in the adductor canal which at which point it was less than 20% residual.  There was three-vessel runoff demonstrated by completion angiography.  The Ansell sheath was then withdrawn across the bifurcation and exchanged over a 0.35 wire for a short 7 [place] sheath  which was secured in place.  The patient did receive 300 mg of p.o. Plavix.  Final Impression: Successful diamondback orbital rotational atherectomy, DCB and superior stenting of a high-grade long segmental calcified mid to distal left SFA stenosis using distal protection (NAV 6) with excellent angiographic result.  The sheath will be removed once ACT falls below 170 and pressure held.  The patient will be treated with DAPT, discharged home in the morning.  We will get lower extremity arterial Doppler studies in our [facility] line office next week and I will see him back 2 to 3 weeks thereafter.  He left the lab in stable condition.  History of Present Illness     Steven Ford is a 57 y.o.male with with HTN and HLD presented for scheduled  arterectomy.   Recently seen by Dr. Allyson Sabal for increasing left lower extremity claudication which is lifestyle limiting with recent Dopplers that showed a high-frequency signal in his distal left SFA. PV angiogram on 10/3 showed long segment bilateral highly calcified SFA stenoses with three-vessel runoff and scheduled for  rotational atherectomy.   Occasionally smokes cigar when plays golf.   Hospital Course     Consultants: None  He underwent successful diamondback orbital rotational atherectomy, DCB and superior stenting of a high-grade long segmental calcified mid to distal left SFA stenosis using distal protection (NAV 6) with excellent angiographic result. No complications. Treated with DAPT. Renal function stable. Ambulated well. Outpatient LE arterial doppler followed by appointment with Dr. Allyson Sabal.   Discharge Vitals Blood pressure 116/71, pulse 62, temperature 98.5 F (36.9 C), resp. rate 13, height 5\' 10"  (1.778 m), weight 94 kg, SpO2 96 %.  Filed Weights   09/04/18 0542 09/05/18 0458  Weight: 93 kg 94 kg   Physical Exam  Constitutional: He is oriented to person, place, and time and well-developed, well-nourished, and in no distress.  HENT:  Head: Normocephalic and atraumatic.  Eyes: Pupils are equal, round, and reactive to light. Conjunctivae are normal.  Neck: Normal range of motion. Neck supple.  Cardiovascular: Normal rate and regular rhythm.  R groin without hematoma or bruise  Pulmonary/Chest: Effort normal and breath sounds normal.  Abdominal: Soft. Bowel sounds are normal.  Musculoskeletal: Normal range of motion.  Neurological: He is alert and oriented to person, place, and time.  Skin: Skin is warm and dry.  Psychiatric: Affect normal.   Labs & Radiologic Studies    CBC Recent Labs    09/04/18 0628 09/05/18 0431  WBC 5.3 7.8  HGB 13.0 12.9*  HCT 41.9 40.5  MCV 92.5 91.0  PLT 248 223   Basic Metabolic Panel Recent Labs    16/10/96 0628  09/05/18 0431  NA 139 141  K 3.7 3.6  CL 108 109  CO2 23 25  GLUCOSE 108* 102*  BUN 18 12  CREATININE 0.95 0.97  CALCIUM 8.6* 8.3*   Liver Function Tests Recent Labs    09/05/18 0431  AST 27  ALT 25  ALKPHOS 60  BILITOT 0.8  PROT 5.9*  ALBUMIN 3.5    Disposition   Pt is being discharged home today in good condition.  Follow-up Plans & Appointments    Follow-up Information    [facility]. Go on 09/15/2018.   Specialty:  Cardiology Why:  @11am  for LE arterial doppler and ABIs Contact information: [address] [place] [address] [phone]       Runell Gess, MD. Go on 09/17/2018.   Specialties:  Cardiology, Radiology Why:  @3 :30pm for procedure follow up Contact information: [address] [place] [address] [phone]          Discharge Instructions    Diet - low sodium heart healthy   Complete by:  As directed    Discharge instructions   Complete by:  As directed    No driving for 48 hours. No lifting over 5 lbs for 1 week. No sexual activity for 1 week. You may return to work on 09/09/18. Keep procedure site clean & dry. If you notice increased pain, swelling, bleeding or pus, call/return!  You may shower, but no soaking baths/hot tubs/pools for 1 week.   Increase activity slowly   Complete by:  As directed       Discharge Medications   Allergies as of 09/05/2018      Reactions   Influenza Vaccines Other (See Comments)   Pt gets sick or sicker with flu shot       Medication List    TAKE these medications   aspirin EC 81 MG tablet Take 81 mg by mouth daily.   clopidogrel 75 MG tablet Commonly known as:  PLAVIX Take 1 tablet (75 mg total) by mouth daily with breakfast. Start taking on:  09/06/2018   lisinopril 5 MG tablet Commonly known as:  PRINIVIL,ZESTRIL Take 1 tablet (5 mg total) by mouth daily.   meloxicam 15 MG tablet Commonly known as:  MOBIC Take 1  tablet by mouth daily as needed for pain.   metoprolol succinate 25 MG 24 hr tablet Commonly known as:  TOPROL-XL Take 1 tablet (25 mg total) by mouth daily.   multivitamin with minerals tablet Take 1 tablet by mouth daily.   rosuvastatin 40 MG tablet Commonly known as:  CRESTOR Take 1 tablet (40 mg total) by mouth daily.        Acute coronary syndrome (MI, NSTEMI, STEMI, etc) this admission?: No.    Outstanding Labs/Studies   None  Duration of Discharge Encounter   Greater than 30 minutes including physician time.  Signed, Manson Passey, PA 09/05/2018, 8:08 AM

## 2018-09-15 ENCOUNTER — Ambulatory Visit (HOSPITAL_COMMUNITY)
Admission: RE | Admit: 2018-09-15 | Discharge: 2018-09-15 | Disposition: A | Discharge status: Home / Self Care | Payer: BC Managed Care – PPO | Source: Ambulatory Visit | Attending: Cardiology | Admitting: Cardiology

## 2018-09-15 ENCOUNTER — Ambulatory Visit (HOSPITAL_COMMUNITY): Payer: BC Managed Care – PPO

## 2018-09-15 DIAGNOSIS — I739 Peripheral vascular disease, unspecified: Secondary | ICD-10-CM | POA: Insufficient documentation

## 2018-09-15 DIAGNOSIS — Z01812 Encounter for preprocedural laboratory examination: Secondary | ICD-10-CM | POA: Diagnosis present

## 2018-09-17 ENCOUNTER — Other Ambulatory Visit: Payer: Self-pay | Admitting: Cardiology

## 2018-09-17 ENCOUNTER — Ambulatory Visit: Payer: BC Managed Care – PPO | Admitting: Cardiovascular Disease

## 2018-09-17 ENCOUNTER — Encounter: Payer: Self-pay | Admitting: Cardiovascular Disease

## 2018-09-17 DIAGNOSIS — I739 Peripheral vascular disease, unspecified: Secondary | ICD-10-CM | POA: Diagnosis not present

## 2018-09-17 DIAGNOSIS — E785 Hyperlipidemia, unspecified: Secondary | ICD-10-CM | POA: Diagnosis not present

## 2018-09-17 NOTE — Addendum Note (Signed)
Addended by: Theressa Stamps on: 09/17/2018 04:41 PM   Modules accepted: Orders

## 2018-09-17 NOTE — Patient Instructions (Signed)
Medication Instructions:  Your physician recommends that you continue on your current medications as directed. Please refer to the Current Medication list given to you today. If you need a refill on your cardiac medications before your next appointment, please call your pharmacy.   Lab work: none If you have labs (blood work) drawn today and your tests are completely normal, you will receive your results only by: Marland Kitchen MyChart Message (if you have MyChart) OR . A paper copy in the mail If you have any lab test that is abnormal or we need to change your treatment, we will call you to review the results.  Testing/Procedures: Your physician has requested that you have a lower extremity arterial exercise duplex. During this test, exercise and ultrasound are used to evaluate arterial blood flow in the legs. Allow one hour for this exam. There are no restrictions or special instructions. Your physician has requested that you have an ankle brachial index (ABI). During this test an ultrasound and blood pressure cuff are used to evaluate the arteries that supply the arms and legs with blood. Allow thirty minutes for this exam. There are no restrictions or special instructions.  SCHEDULE IN 6 MONTHS.  Follow-Up: At [facility], you and your health needs are our priority.  As part of our continuing mission to provide you with exceptional heart care, we have created designated Provider Care Teams.  These Care Teams include your primary Cardiologist (physician) and Advanced Practice Providers (APPs -  Physician Assistants and Nurse Practitioners) who all work together to provide you with the care you need, when you need it. You will need a follow up appointment in 12 months.  Please call our office 2 months in advance to schedule this appointment.  You may see Dr. Allyson Sabal or one of the following Advanced Practice Providers on your designated Care Team:   Corine Shelter, PA-C Judy Pimple, [place] . Marjie Skiff, PA-C  Any Other Special Instructions Will Be Listed Below (If Applicable). Go to [email or web address] for your co-pay card. Please print and keep until Belenda Cruise, PharmD. Calls with instructions.

## 2018-09-17 NOTE — Assessment & Plan Note (Signed)
>>  ASSESSMENT AND PLAN FOR DYSLIPIDEMIA WRITTEN ON 09/17/2018  4:29 PM BY BERRY, Delton See, MD  History of hyperlipidemia on Crestor 40 mg a day with recent lipid profile performed 07/15/2018 revealing total cholesterol 203, LDL 147 and HDL 45.  He is not a goal for secondary prevention.  I suggested that we start Repatha.

## 2018-09-17 NOTE — Progress Notes (Signed)
09/17/2018 Steven Ford   10-10-1961  [id number]  Primary Physician Patient, No Pcp Per Primary Cardiologist: Runell Gess MD Roseanne Reno  HPI:  Steven Ford is a 57 y.o.  appearing married African-American male father for, grandfather of 2 grandchildren referred by Dr. Bing Matter for peripheral vascular evaluation because of lifestyle limiting claudication.  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  I last saw him in the office 08/26/2018. His risk factors include treated hypertension hyperlipidemia.  Mother did have a myocardial infarction.  He had bypass grafting x3 in 2002 and is been asymptomatic from this since.  He works out 3-4 times a week doing cardio and bicycle riding and is done this since his bypass surgery.  Over the last year he is noticed increasing left lower extremity claudication which is lifestyle limiting with recent Dopplers that showed a high-frequency signal in his distal left SFA. I performed initial angiography on him 08/28/2018 revealing high-grade calcified segmental bilateral mid to distal SFA stenoses with three-vessel runoff.  I brought him back on 09/04/2018 and performed Dynabac orbital rotational atherectomy, drug-coated balloon angioplasty and Supera stenting of his entire mid to distal left SFA with an excellent angiographic and clinical result.  His follow-up Dopplers normalized and his claudication has resolved.  He continues to say that he has no symptoms on the right side.  Current Meds  Medication Sig  . aspirin EC 81 MG tablet Take 81 mg by mouth daily.  . clopidogrel (PLAVIX) 75 MG tablet Take 1 tablet (75 mg total) by mouth daily with breakfast.  . lisinopril (PRINIVIL,ZESTRIL) 5 MG tablet Take 1 tablet (5 mg total) by mouth daily.  . meloxicam (MOBIC) 15 MG tablet Take 1 tablet by mouth daily as needed for pain.   . metoprolol succinate (TOPROL-XL) 25 MG 24 hr tablet Take 1 tablet (25 mg total) by  mouth daily.  . Multiple Vitamins-Minerals (MULTIVITAMIN WITH MINERALS) tablet Take 1 tablet by mouth daily.  . rosuvastatin (CRESTOR) 40 MG tablet Take 1 tablet (40 mg total) by mouth daily.     Allergies  Allergen Reactions  . Influenza Vaccines Other (See Comments)    Pt gets sick or sicker with flu shot     Social History   Socioeconomic History  . Marital status: Married    Spouse name: Not on file  . Number of children: Not on file  . Years of education: Not on file  . Highest education level: Not on file  Occupational History  . Not on file  Social Needs  . Financial resource strain: Not on file  . Food insecurity:    Worry: Not on file    Inability: Not on file  . Transportation needs:    Medical: Not on file    Non-medical: Not on file  Tobacco Use  . Smoking status: Light Tobacco Smoker    Years: 5.00    Types: Cigars  . Smokeless tobacco: Never Used  . Tobacco comment: 09/04/2018 "q time I play golf"  Substance and Sexual Activity  . Alcohol use: Yes    Alcohol/week: 2.0 standard drinks    Types: 2 Cans of beer per week  . Drug use: No  . Sexual activity: Yes  Lifestyle  . Physical activity:    Days per week: Not on file    Minutes per session: Not on file  . Stress: Not on file  Relationships  . Social connections:  Talks on phone: Not on file    Gets together: Not on file    Attends religious service: Not on file    Active member of club or organization: Not on file    Attends meetings of clubs or organizations: Not on file    Relationship status: Not on file  . Intimate partner violence:    Fear of current or ex partner: Not on file    Emotionally abused: Not on file    Physically abused: Not on file    Forced sexual activity: Not on file  Other Topics Concern  . Not on file  Social History Narrative  . Not on file     Review of Systems: General: negative for chills, fever, night sweats or weight changes.  Cardiovascular: negative for  chest pain, dyspnea on exertion, edema, orthopnea, palpitations, paroxysmal nocturnal dyspnea or shortness of breath Dermatological: negative for rash Respiratory: negative for cough or wheezing Urologic: negative for hematuria Abdominal: negative for nausea, vomiting, diarrhea, bright red blood per rectum, melena, or hematemesis Neurologic: negative for visual changes, syncope, or dizziness All other systems reviewed and are otherwise negative except as noted above.    Blood pressure 122/80, pulse (!) 107, height 5\' 10"  (1.778 m), weight 204 lb (92.5 kg), SpO2 93 %.  General appearance: alert and no distress Neck: no adenopathy, no carotid bruit, no JVD, supple, symmetrical, trachea midline and thyroid not enlarged, symmetric, no tenderness/mass/nodules Lungs: clear to auscultation bilaterally Heart: regular rate and rhythm, S1, S2 normal, no murmur, click, rub or gallop Extremities: extremities normal, atraumatic, no cyanosis or edema Pulses: 2+ and symmetric Skin: Skin color, texture, turgor normal. No rashes or lesions Neurologic: Alert and oriented X 3, normal strength and tone. Normal symmetric reflexes. Normal coordination and gait  EKG not performed today  ASSESSMENT AND PLAN:   Claudication in peripheral vascular disease (HCC) History of peripheral arterial disease status post initial angiogram which revealed high-grade bilateral calcific segmental SFA stenosis.  He underwent Dynabac orbital rotational atherectomy, drug coated balloon angioplasty and superior stenting of his left SFA by myself 09/04/2018 with an excellent angiographic and clinical result.  His follow-up Dopplers performed 09/15/2018 revealed a normal left ABI with a right ABI 0.8 and a widely patent stent.  His claudication has resolved.  He is on dual antibiotic therapy.  Currently, he is asymptomatic on the right side but I suspect ultimately he will require right SFA intervention in the future.  Continue to  follow him noninvasively every 6 months.  Dyslipidemia History of hyperlipidemia on Crestor 40 mg a day with recent lipid profile performed 07/15/2018 revealing total cholesterol 203, LDL 147 and HDL 45.  He is not a goal for secondary prevention.  I suggested that we start Repatha.      Runell Gess MD FACP,FACC,FAHA, [facility] 09/17/2018 4:30 PM

## 2018-09-17 NOTE — Assessment & Plan Note (Signed)
History of hyperlipidemia on Crestor 40 mg a day with recent lipid profile performed 07/15/2018 revealing total cholesterol 203, LDL 147 and HDL 45.  He is not a goal for secondary prevention.  I suggested that we start Repatha.

## 2018-09-17 NOTE — Assessment & Plan Note (Signed)
History of peripheral arterial disease status post initial angiogram which revealed high-grade bilateral calcific segmental SFA stenosis.  He underwent Dynabac orbital rotational atherectomy, drug coated balloon angioplasty and superior stenting of his left SFA by myself 09/04/2018 with an excellent angiographic and clinical result.  His follow-up Dopplers performed 09/15/2018 revealed a normal left ABI with a right ABI 0.8 and a widely patent stent.  His claudication has resolved.  He is on dual antibiotic therapy.  Currently, he is asymptomatic on the right side but I suspect ultimately he will require right SFA intervention in the future.  Continue to follow him noninvasively every 6 months.

## 2018-09-22 ENCOUNTER — Encounter: Payer: Self-pay | Admitting: Cardiology

## 2018-09-22 ENCOUNTER — Ambulatory Visit (INDEPENDENT_AMBULATORY_CARE_PROVIDER_SITE_OTHER): Payer: BC Managed Care – PPO | Admitting: Cardiology

## 2018-09-22 VITALS — BP 120/60 | HR 66 | Ht 70.0 in | Wt 208.4 lb

## 2018-09-22 DIAGNOSIS — N5201 Erectile dysfunction due to arterial insufficiency: Secondary | ICD-10-CM | POA: Diagnosis not present

## 2018-09-22 DIAGNOSIS — I739 Peripheral vascular disease, unspecified: Secondary | ICD-10-CM | POA: Diagnosis not present

## 2018-09-22 DIAGNOSIS — I2510 Atherosclerotic heart disease of native coronary artery without angina pectoris: Secondary | ICD-10-CM | POA: Diagnosis not present

## 2018-09-22 DIAGNOSIS — E785 Hyperlipidemia, unspecified: Secondary | ICD-10-CM | POA: Diagnosis not present

## 2018-09-22 NOTE — Patient Instructions (Signed)
Medication Instructions:  Your physician recommends that you continue on your current medications as directed. Please refer to the Current Medication list given to you today.  If you need a refill on your cardiac medications before your next appointment, please call your pharmacy.   Lab work: Your physician recommends that you return for lab work this week (fasting) lipids.  If you have labs (blood work) drawn today and your tests are completely normal, you will receive your results only by: Marland Kitchen MyChart Message (if you have MyChart) OR . A paper copy in the mail If you have any lab test that is abnormal or we need to change your treatment, we will call you to review the results.  Testing/Procedures: None.  Follow-Up: At [facility], you and your health needs are our priority.  As part of our continuing mission to provide you with exceptional heart care, we have created designated Provider Care Teams.  These Care Teams include your primary Cardiologist (physician) and Advanced Practice Providers (APPs -  Physician Assistants and Nurse Practitioners) who all work together to provide you with the care you need, when you need it. You will need a follow up appointment in 3 months.  Please call our office 2 months in advance to schedule this appointment.  You may see Gypsy Balsam, MD or another member of our [facility] Provider Team in [place]: Norman Herrlich, MD . Belva Crome, MD  Any Other Special Instructions Will Be Listed Below (If Applicable).

## 2018-09-22 NOTE — Progress Notes (Signed)
Cardiology Office Note:    Date:  09/22/2018   ID:  Steven Ford, DOB [birth year], MRN [id number]  PCP:  Patient, No Pcp Per  Cardiologist:  Gypsy Balsam, MD    Referring MD: No ref. provider found   No chief complaint on file. Doing much better  History of Present Illness:    Steven Ford is a 57 y.o. male with coronary artery disease, status post coronary artery bypass graft.  Recent intervention of his left superficial femoral artery with excellent results.  He is feeling great he said his leg is not hurting him he is back to exercises and is able to walk right gestational bike and no problem.  Denies have any chest pain tightness squeezing pressure burning chest.  Past Medical History:  Diagnosis Date  . Coronary artery disease   . High cholesterol   . Hyperlipidemia   . Hypertension   . Migraine    "none in a long long while" (09/04/2018)  . PVD (peripheral vascular disease) (HCC)     Past Surgical History:  Procedure Laterality Date  . CORONARY ARTERY BYPASS GRAFT  2002   "triple"  . KNEE ARTHROSCOPY Right 2019  . LOWER EXTREMITY ANGIOGRAPHY N/A 08/28/2018   Procedure: LOWER EXTREMITY ANGIOGRAPHY;  Surgeon: Runell Gess, MD;  Location: [facility] INVASIVE CV LAB;  Service: Cardiovascular;  Laterality: N/A;  . LOWER EXTREMITY ANGIOGRAPHY N/A 09/04/2018   Procedure: LOWER EXTREMITY ANGIOGRAPHY;  Surgeon: Runell Gess, MD;  Location: [facility] INVASIVE CV LAB;  Service: Cardiovascular;  Laterality: N/A;  . PERIPHERAL VASCULAR INTERVENTION Left 09/04/2018  . PERIPHERAL VASCULAR INTERVENTION Left 09/04/2018   Procedure: PERIPHERAL VASCULAR INTERVENTION;  Surgeon: Runell Gess, MD;  Location: [facility] INVASIVE CV LAB;  Service: Cardiovascular;  Laterality: Left;    Current Medications: Current Meds  Medication Sig  . aspirin EC 81 MG tablet Take 81 mg by mouth daily.  . clopidogrel (PLAVIX) 75 MG tablet Take 1 tablet (75 mg total) by mouth daily with breakfast.   . lisinopril (PRINIVIL,ZESTRIL) 5 MG tablet Take 1 tablet (5 mg total) by mouth daily.  . meloxicam (MOBIC) 15 MG tablet Take 1 tablet by mouth daily as needed for pain.   . metoprolol succinate (TOPROL-XL) 25 MG 24 hr tablet Take 1 tablet (25 mg total) by mouth daily.  . Multiple Vitamins-Minerals (MULTIVITAMIN WITH MINERALS) tablet Take 1 tablet by mouth daily.  . rosuvastatin (CRESTOR) 40 MG tablet Take 1 tablet (40 mg total) by mouth daily.     Allergies:   Influenza vaccines   Social History   Socioeconomic History  . Marital status: Married    Spouse name: Not on file  . Number of children: Not on file  . Years of education: Not on file  . Highest education level: Not on file  Occupational History  . Not on file  Social Needs  . Financial resource strain: Not on file  . Food insecurity:    Worry: Not on file    Inability: Not on file  . Transportation needs:    Medical: Not on file    Non-medical: Not on file  Tobacco Use  . Smoking status: Light Tobacco Smoker    Years: 5.00    Types: Cigars  . Smokeless tobacco: Never Used  . Tobacco comment: 09/04/2018 "q time I play golf"  Substance and Sexual Activity  . Alcohol use: Yes    Alcohol/week: 2.0 standard drinks    Types: 2 Cans of beer per  week  . Drug use: No  . Sexual activity: Yes  Lifestyle  . Physical activity:    Days per week: Not on file    Minutes per session: Not on file  . Stress: Not on file  Relationships  . Social connections:    Talks on phone: Not on file    Gets together: Not on file    Attends religious service: Not on file    Active member of club or organization: Not on file    Attends meetings of clubs or organizations: Not on file    Relationship status: Not on file  Other Topics Concern  . Not on file  Social History Narrative  . Not on file     Family History: The patient's family history includes Anemia in his father; Heart disease in his mother. ROS:   Please see the  history of present illness.    All 14 point review of systems negative except as described per history of present illness  EKGs/Labs/Other Studies Reviewed:      Recent Labs: 08/26/2018: TSH 2.150 09/05/2018: ALT 25; BUN 12; Creatinine, Ser 0.97; Hemoglobin 12.9; Platelets 223; Potassium 3.6; Sodium 141  Recent Lipid Panel    Component Value Date/Time   CHOL 203 (H) 07/15/2018 0934   TRIG 54 07/15/2018 0934   HDL 45 07/15/2018 0934   CHOLHDL 4.5 07/15/2018 0934   LDLCALC 147 (H) 07/15/2018 0934    Physical Exam:    VS:  BP 120/60   Pulse 66   Ht 5\' 10"  (1.778 m)   Wt 208 lb 6.4 oz (94.5 kg)   SpO2 94%   BMI 29.90 kg/m     Wt Readings from Last 3 Encounters:  09/22/18 208 lb 6.4 oz (94.5 kg)  09/17/18 204 lb (92.5 kg)  09/05/18 207 lb 3.7 oz (94 kg)     GEN:  Well nourished, well developed in no acute distress HEENT: Normal NECK: No JVD; No carotid bruits LYMPHATICS: No lymphadenopathy CARDIAC: RRR, no murmurs, no rubs, no gallops RESPIRATORY:  Clear to auscultation without rales, wheezing or rhonchi  ABDOMEN: Soft, non-tender, non-distended MUSCULOSKELETAL:  No edema; No deformity  SKIN: Warm and dry LOWER EXTREMITIES: no swelling NEUROLOGIC:  Alert and oriented x 3 PSYCHIATRIC:  Normal affect   ASSESSMENT:    1. Coronary artery disease involving native coronary artery of native heart without angina pectoris   2. Claudication in peripheral vascular disease (HCC)   3. Dyslipidemia   4. Erectile dysfunction due to arterial insufficiency    PLAN:    In order of problems listed above:  1. Coronary artery disease stable on appropriate medications which I will continue. 2. Claudication peripheral vascular disease status post intervention left superficial femoral artery is doing well from that point review quite amazing improvement. 3. Dyslipidemia in spite of high intensity statin his LDL is still elevated we will allow him in our [facility] clinic.  I will check his  cholesterol again since her last LDL 147 was what he probably did not take his statin. 4. I rectal dysfunction so far no problem so far did not require any medications for it   Medication Adjustments/Labs and Tests Ordered: Current medicines are reviewed at length with the patient today.  Concerns regarding medicines are outlined above.  No orders of the defined types were placed in this encounter.  Medication changes: No orders of the defined types were placed in this encounter.   Signed, Georgeanna Lea, MD, [facility] 09/22/2018  4:08 PM    [facility]

## 2018-09-24 ENCOUNTER — Telehealth: Payer: Self-pay | Admitting: Pharmacist Clinician (PhC)/ Clinical Pharmacy Specialist

## 2018-09-24 MED ORDER — EVOLOCUMAB 140 MG/ML ~~LOC~~ SOAJ: 140.0000 mg | SUBCUTANEOUS | 12 refills | Status: DC

## 2018-09-24 NOTE — Telephone Encounter (Signed)
Repatha approved to 09/24/2019.  Insurance requires rx sent to [facility]

## 2018-11-10 NOTE — Telephone Encounter (Signed)
Patient called inquiring about this.  He states he hasn't received the medication at all. He can be reached on his cell at [phone].

## 2018-11-10 NOTE — Telephone Encounter (Signed)
[facility] Rx claim paid since October/31.   Unable to reach patient after calling x 3. [facility] also sent letter to patient on Nov/14/2019.   *Talked to Mr Steven Ford today* [facility] is [facility] @ [phone] and place order for 1st shipment ASAP.

## 2018-12-29 ENCOUNTER — Ambulatory Visit: Payer: BC Managed Care – PPO | Admitting: Cardiology

## 2018-12-31 ENCOUNTER — Other Ambulatory Visit: Payer: Self-pay

## 2018-12-31 MED ORDER — EVOLOCUMAB 140 MG/ML ~~LOC~~ SOAJ: 140.0000 mg | SUBCUTANEOUS | 12 refills | Status: DC

## 2019-03-16 ENCOUNTER — Telehealth: Payer: Self-pay | Admitting: Cardiology

## 2019-03-16 NOTE — Telephone Encounter (Signed)
Ask him if anything else works, like viagra, if not we need to call insurance and discover why cialis is ni covered and which one they do cover,

## 2019-03-16 NOTE — Telephone Encounter (Signed)
        Patient calling to have Dr. Bing Matter refill this prescription. I don't see it on his med list but he says Dr. Charm Rings been refilling it for him. Apparently insurance won't cover it and he wants to appeal that. Below is the contact information  The medicine is Cialis and it's not covered by my insurance [employer] and [facility] for some reason. This is the number and information that [employer] told me to give to my Physician for him to call. The Phone number is for [facility]- [phone] ( They said ask for an exception for this medicine) This is the address they gave me so that my doctor could write a letter explaining why I need the pills in relations to my heart condition in case the phone call doesn't get the results we want. I was told that between the two one of these processes should work. Thanks in Advance. 3rd Party Administrator [employer] of [employer] Department [address] [address] [place] [address]

## 2019-03-17 ENCOUNTER — Encounter: Payer: Self-pay | Admitting: Emergency Medicine

## 2019-03-17 MED ORDER — TADALAFIL 20 MG PO TABS: 20.0000 mg | ORAL_TABLET | Freq: Every day | ORAL | 1 refills | Status: DC | PRN

## 2019-03-17 NOTE — Telephone Encounter (Signed)
Patient reports he is unable to take viagra due to it giving him a headache.

## 2019-03-17 NOTE — Telephone Encounter (Signed)
Called [facility] they informed me thst Cialis will not be covered and we would need to send a medical necessity letter marked urgent via fax to [phone]

## 2019-03-17 NOTE — Telephone Encounter (Signed)
Spoke with Dr. Bing Matter confirmed dose of Cialis I've ordered this and will contact [facility] to complete prior authorization.

## 2019-03-17 NOTE — Addendum Note (Signed)
Addended by: Lita Mains on: 03/17/2019 10:29 AM   Modules accepted: Orders

## 2019-03-17 NOTE — Telephone Encounter (Signed)
Letter has been generated and sent to Dr. Bing Matter. He will sign and have it faxed back to [phone]

## 2019-03-17 NOTE — Telephone Encounter (Signed)
Called patient back to inquire if a different medication would work for him and we could prescribe that one, however he reports that his insurance will not cover any erectile dysfunction medications. I will call [facility] for more information and inform patient with updates.

## 2019-03-20 ENCOUNTER — Encounter (HOSPITAL_COMMUNITY): Payer: BC Managed Care – PPO

## 2019-03-24 ENCOUNTER — Encounter: Payer: Self-pay | Admitting: Emergency Medicine

## 2019-03-24 NOTE — Telephone Encounter (Signed)
Letter has been faxed.

## 2019-03-30 ENCOUNTER — Other Ambulatory Visit: Payer: Self-pay | Admitting: Cardiology

## 2019-03-30 NOTE — Telephone Encounter (Signed)
Rx refill sent to pharmacy. 

## 2019-04-08 ENCOUNTER — Other Ambulatory Visit: Payer: Self-pay | Admitting: Cardiovascular Disease

## 2019-04-08 DIAGNOSIS — Z95820 Peripheral vascular angioplasty status with implants and grafts: Secondary | ICD-10-CM

## 2019-04-08 DIAGNOSIS — I739 Peripheral vascular disease, unspecified: Secondary | ICD-10-CM

## 2019-04-09 ENCOUNTER — Inpatient Hospital Stay (HOSPITAL_COMMUNITY): Admission: RE | Admit: 2019-04-09 | Payer: BC Managed Care – PPO | Source: Ambulatory Visit

## 2019-05-05 ENCOUNTER — Ambulatory Visit (HOSPITAL_COMMUNITY)
Admission: RE | Admit: 2019-05-05 | Payer: BC Managed Care – PPO | Source: Ambulatory Visit | Attending: Cardiovascular Disease | Admitting: Cardiovascular Disease

## 2019-05-18 ENCOUNTER — Ambulatory Visit (HOSPITAL_COMMUNITY)
Admission: RE | Admit: 2019-05-18 | Discharge: 2019-05-18 | Disposition: A | Discharge status: Home / Self Care | Payer: BC Managed Care – PPO | Source: Ambulatory Visit | Attending: Cardiovascular Disease | Admitting: Cardiovascular Disease

## 2019-05-18 ENCOUNTER — Other Ambulatory Visit: Payer: Self-pay

## 2019-05-18 ENCOUNTER — Other Ambulatory Visit (HOSPITAL_COMMUNITY): Payer: Self-pay | Admitting: Cardiovascular Disease

## 2019-05-18 DIAGNOSIS — Z95820 Peripheral vascular angioplasty status with implants and grafts: Secondary | ICD-10-CM | POA: Diagnosis not present

## 2019-05-18 DIAGNOSIS — I739 Peripheral vascular disease, unspecified: Secondary | ICD-10-CM

## 2019-05-26 ENCOUNTER — Other Ambulatory Visit: Payer: Self-pay

## 2019-05-26 NOTE — Progress Notes (Signed)
Notes recorded by Lorretta Harp, MD on 05/18/2019 at 3:47 PM EDT  No change from prior study. Repeat in 12 months.  History of PAD and left SFA stent placement.

## 2019-08-01 ENCOUNTER — Other Ambulatory Visit: Payer: Self-pay | Admitting: Cardiology

## 2019-11-11 ENCOUNTER — Other Ambulatory Visit: Payer: Self-pay | Admitting: Cardiovascular Disease

## 2019-11-12 ENCOUNTER — Other Ambulatory Visit: Payer: Self-pay | Admitting: Cardiology

## 2019-11-12 NOTE — Telephone Encounter (Signed)
He needs to have follow-up appointment.  I have not seen him since October 2019

## 2019-11-13 NOTE — Telephone Encounter (Signed)
Rx refill sent to pharmacy. 

## 2019-11-17 NOTE — Progress Notes (Deleted)
Cardiology Clinic Note   Patient Name: Renato ShinReginald H Curfman Date of Encounter: 11/17/2019  Primary Care Provider:  Patient, No Pcp Per Primary Cardiologist:  Gypsy Balsamobert Krasowski, MD  Patient Profile    Luanna Salkeginald H. Falotico 58 year old male presents today for follow-up of his coronary artery disease, dyslipidemia, and claudication.  Past Medical History    Past Medical History:  Diagnosis Date  . Coronary artery disease   . High cholesterol   . Hyperlipidemia   . Hypertension   . Migraine    "none in a long long while" (09/04/2018)  . PVD (peripheral vascular disease) (HCC)    Past Surgical History:  Procedure Laterality Date  . CORONARY ARTERY BYPASS GRAFT  2002   "triple"  . KNEE ARTHROSCOPY Right 2019  . LOWER EXTREMITY ANGIOGRAPHY N/A 08/28/2018   Procedure: LOWER EXTREMITY ANGIOGRAPHY;  Surgeon: Runell GessBerry, Jonathan J, MD;  Location: [facility];  Service: Cardiovascular;  Laterality: N/A;  . LOWER EXTREMITY ANGIOGRAPHY N/A 09/04/2018   Procedure: LOWER EXTREMITY ANGIOGRAPHY;  Surgeon: Runell GessBerry, Jonathan J, MD;  Location: [facility];  Service: Cardiovascular;  Laterality: N/A;  . PERIPHERAL VASCULAR INTERVENTION Left 09/04/2018  . PERIPHERAL VASCULAR INTERVENTION Left 09/04/2018   Procedure: PERIPHERAL VASCULAR INTERVENTION;  Surgeon: Runell GessBerry, Jonathan J, MD;  Location: [facility];  Service: Cardiovascular;  Laterality: Left;    Allergies  Allergies  Allergen Reactions  . Influenza Vaccines Other (See Comments)    Pt gets sick or sicker with flu shot     History of Present Illness  Mr. Adah SalvageLassiter has a past medical history of coronary artery disease, status post CABG, dyslipidemia, claudication, left SFA DCB angioplasty and stenting 08/2018.  He was last seen by Dr. Bing MatterKrasowski on 08/2018.  He was doing well at that time.  He is back to his normal exercises and was able to walk and ride a stationary bike without difficulty.  He denied chest discomfort at  that time.  He presents the clinic today and states****  *** denies chest pain, shortness of breath, lower extremity edema, fatigue, palpitations, melena, hematuria, hemoptysis, diaphoresis, weakness, presyncope, syncope, orthopnea, and PND.   Home Medications    Prior to Admission medications   Medication Sig Start Date End Date Taking? Authorizing Provider  aspirin EC 81 MG tablet Take 81 mg by mouth daily.    [provider]  clopidogrel (PLAVIX) 75 MG tablet Take 1 tablet (75 mg total) by mouth daily with breakfast. 09/06/18   Bhagat, Bhavinkumar, PA  Evolocumab (REPATHA SURECLICK) 140 MG/ML SOAJ Inject 140 mg into the skin every 14 (fourteen) days. 12/31/18   Georgeanna LeaKrasowski, Robert J, MD  lisinopril (ZESTRIL) 5 MG tablet TAKE 1 TABLET BY MOUTH EVERY DAY 11/13/19   Georgeanna LeaKrasowski, Robert J, MD  meloxicam (MOBIC) 15 MG tablet Take 1 tablet by mouth daily as needed for pain.  07/11/18   [provider]  metoprolol succinate (TOPROL-XL) 25 MG 24 hr tablet TAKE 1 TABLET BY MOUTH EVERY DAY 11/13/19 11/28/19  Georgeanna LeaKrasowski, Robert J, MD  Multiple Vitamins-Minerals (MULTIVITAMIN WITH MINERALS) tablet Take 1 tablet by mouth daily.    [provider]  rosuvastatin (CRESTOR) 40 MG tablet Take 1 tablet (40 mg total) by mouth daily. 07/16/18 10/14/18  Georgeanna LeaKrasowski, Robert J, MD  tadalafil (CIALIS) 20 MG tablet Take 1 tablet (20 mg total) by mouth daily as needed for erectile dysfunction. 03/17/19   Georgeanna LeaKrasowski, Robert J, MD    Family History    Family History  Problem Relation Age of Onset  . Heart disease Mother   . Anemia Father    He indicated that his mother is deceased. He indicated that his father is deceased.  Social History    Social History   Socioeconomic History  . Marital status: Married    Spouse name: Not on file  . Number of children: Not on file  . Years of education: Not on file  . Highest education level: Not on file  Occupational History  . Not on file  Tobacco  Use  . Smoking status: Light Tobacco Smoker    Years: 5.00    Types: Cigars  . Smokeless tobacco: Never Used  . Tobacco comment: 09/04/2018 "q time I play golf"  Substance and Sexual Activity  . Alcohol use: Yes    Alcohol/week: 2.0 standard drinks    Types: 2 Cans of beer per week  . Drug use: No  . Sexual activity: Yes  Other Topics Concern  . Not on file  Social History Narrative  . Not on file   Social Determinants of Health   Financial Resource Strain:   . Difficulty of Paying Living Expenses: Not on file  Food Insecurity:   . Worried About Charity fundraiser in the Last Year: Not on file  . Ran Out of Food in the Last Year: Not on file  Transportation Needs:   . Lack of Transportation (Medical): Not on file  . Lack of Transportation (Non-Medical): Not on file  Physical Activity:   . Days of Exercise per Week: Not on file  . Minutes of Exercise per Session: Not on file  Stress:   . Feeling of Stress : Not on file  Social Connections:   . Frequency of Communication with Friends and Family: Not on file  . Frequency of Social Gatherings with Friends and Family: Not on file  . Attends Religious Services: Not on file  . Active Member of Clubs or Organizations: Not on file  . Attends Archivist Meetings: Not on file  . Marital Status: Not on file  Intimate Partner Violence:   . Fear of Current or Ex-Partner: Not on file  . Emotionally Abused: Not on file  . Physically Abused: Not on file  . Sexually Abused: Not on file     Review of Systems    General:  No chills, fever, night sweats or weight changes.  Cardiovascular:  No chest pain, dyspnea on exertion, edema, orthopnea, palpitations, paroxysmal nocturnal dyspnea. Dermatological: No rash, lesions/masses Respiratory: No cough, dyspnea Urologic: No hematuria, dysuria Abdominal:   No nausea, vomiting, diarrhea, bright red blood per rectum, melena, or hematemesis Neurologic:  No visual changes, wkns,  changes in mental status. All other systems reviewed and are otherwise negative except as noted above.  Physical Exam    VS:  There were no vitals taken for this visit. , BMI There is no height or weight on file to calculate BMI. GEN: Well nourished, well developed, in no acute distress. HEENT: normal. Neck: Supple, no JVD, carotid bruits, or masses. Cardiac: RRR, no murmurs, rubs, or gallops. No clubbing, cyanosis, edema.  Radials/DP/PT 2+ and equal bilaterally.  Respiratory:  Respirations regular and unlabored, clear to auscultation bilaterally. GI: Soft, nontender, nondistended, BS + x 4. MS: no deformity or atrophy. Skin: warm and dry, no rash. Neuro:  Strength and sensation are intact. Psych: Normal affect.  Accessory Clinical Findings    ECG personally reviewed by me today- *** - No  acute changes  EKG  Echocardiogram    Assessment & Plan   1.  Coronary artery disease-no chest pain today.  Continues to be very physically active Continue ASA Continue lisinopril 5 mg daily Continue metoprolol succinate 25   HTN- BP today ***. Well controlled at home.  Continue lisinopril 5 mg daily Continue metoprolol succinate 25  Repeat BMP  Claudication-no recent episodes of claudication. He continues to be very physically active and use his stationary bike. Left SFA DCB angioplasty and stenting 08/2018.   Continue Crestor 40 mg daily Continue Repatha 140 every 14 days  Heart healthy low sodium high fiber diet  Increase physical activity as tolerated   Dyslipidemia-LDL 147 07/15/2018 Continue Crestor 40 mg daily Continue Repatha 140 every 14 days  Repeat lipid panel Heart healthy low sodium high fiber diet  Increase physical activity as tolerated   Disposition: f/u with Dr. Bing Matter in 1 yr and PRN.   [name]. Farren Nelles NP-C     [facility] [address] Office [phone] Fax [phone]

## 2019-11-18 ENCOUNTER — Ambulatory Visit: Payer: BC Managed Care – PPO | Admitting: General Practice

## 2019-12-10 ENCOUNTER — Telehealth: Payer: Self-pay | Admitting: Emergency Medicine

## 2019-12-10 NOTE — Telephone Encounter (Signed)
Patient informed he needed appointment for surgery clearance. Patient verbally understood he was scheduled no further questions.

## 2019-12-11 ENCOUNTER — Encounter: Payer: Self-pay | Admitting: Cardiology

## 2019-12-11 ENCOUNTER — Other Ambulatory Visit: Payer: Self-pay

## 2019-12-11 ENCOUNTER — Ambulatory Visit (INDEPENDENT_AMBULATORY_CARE_PROVIDER_SITE_OTHER): Payer: BC Managed Care – PPO | Admitting: Cardiology

## 2019-12-11 VITALS — BP 140/92 | HR 50 | Ht 70.0 in | Wt 207.0 lb

## 2019-12-11 DIAGNOSIS — I2510 Atherosclerotic heart disease of native coronary artery without angina pectoris: Secondary | ICD-10-CM | POA: Diagnosis not present

## 2019-12-11 DIAGNOSIS — I739 Peripheral vascular disease, unspecified: Secondary | ICD-10-CM

## 2019-12-11 DIAGNOSIS — Z01810 Encounter for preprocedural cardiovascular examination: Secondary | ICD-10-CM | POA: Insufficient documentation

## 2019-12-11 DIAGNOSIS — Z951 Presence of aortocoronary bypass graft: Secondary | ICD-10-CM

## 2019-12-11 DIAGNOSIS — E785 Hyperlipidemia, unspecified: Secondary | ICD-10-CM | POA: Diagnosis not present

## 2019-12-11 HISTORY — DX: Encounter for preprocedural cardiovascular examination: Z01.810

## 2019-12-11 LAB — LIPID PANEL
Chol/HDL Ratio: 4.1 ratio (ref 0.0–5.0)
Cholesterol, Total: 192 mg/dL (ref 100–199)
HDL: 47 mg/dL (ref >39)
LDL Chol Calc (NIH): 133 mg/dL — ABNORMAL HIGH (ref 0–99)
Triglycerides: 62 mg/dL (ref 0–149)
VLDL Cholesterol Cal: 12 mg/dL (ref 5–40)

## 2019-12-11 NOTE — Patient Instructions (Signed)
Medication Instructions:  Your physician recommends that you continue on your current medications as directed. Please refer to the Current Medication list given to you today.  *If you need a refill on your cardiac medications before your next appointment, please call your pharmacy*  Lab Work: Your physician recommends that you return for lab work today: lipid   If you have labs (blood work) drawn today and your tests are completely normal, you will receive your results only by: Marland Kitchen MyChart Message (if you have MyChart) OR . A paper copy in the mail If you have any lab test that is abnormal or we need to change your treatment, we will call you to review the results.  Testing/Procedures: Your physician has requested that you have an echocardiogram. Echocardiography is a painless test that uses sound waves to create images of your heart. It provides your doctor with information about the size and shape of your heart and how well your heart's chambers and valves are working. This procedure takes approximately one hour. There are no restrictions for this procedure.  Your physician has requested that you have a lexiscan myoview. For further information please visit [email or web address]. Please follow instruction sheet, as given.  Your physician has requested that you have a lower extremity arterial exercise duplex. During this test, exercise and ultrasound are used to evaluate arterial blood flow in the legs. Allow one hour for this exam. There are no restrictions or special instructions.   Follow-Up: At [facility], you and your health needs are our priority.  As part of our continuing mission to provide you with exceptional heart care, we have created designated Provider Care Teams.  These Care Teams include your primary Cardiologist (physician) and Advanced Practice Providers (APPs -  Physician Assistants and Nurse Practitioners) who all work together to provide you with the care you need, when  you need it.  Your next appointment:   3 month(s)  The format for your next appointment:   In Person  Provider:   Jenne Campus, MD  Other Instructions   Cardiac Nuclear Scan A cardiac nuclear scan is a test that measures blood flow to the heart when a person is resting and when he or she is exercising. The test looks for problems such as:  Not enough blood reaching a portion of the heart.  The heart muscle not working normally. You may need this test if:  You have heart disease.  You have had abnormal lab results.  You have had heart surgery or a balloon procedure to open up blocked arteries (angioplasty).  You have chest pain.  You have shortness of breath. In this test, a radioactive dye (tracer) is injected into your bloodstream. After the tracer has traveled to your heart, an imaging device is used to measure how much of the tracer is absorbed by or distributed to various areas of your heart. This procedure is usually done at a hospital and takes 2-4 hours. Tell a health care provider about:  Any allergies you have.  All medicines you are taking, including vitamins, herbs, eye drops, creams, and over-the-counter medicines.  Any problems you or family members have had with anesthetic medicines.  Any blood disorders you have.  Any surgeries you have had.  Any medical conditions you have.  Whether you are pregnant or may be pregnant. What are the risks? Generally, this is a safe procedure. However, problems may occur, including:  Serious chest pain and heart attack. This is only a risk if the  stress portion of the test is done.  Rapid heartbeat.  Sensation of warmth in your chest. This usually passes quickly.  Allergic reaction to the tracer. What happens before the procedure?  Ask your health care provider about changing or stopping your regular medicines. This is especially important if you are taking diabetes medicines or blood thinners.  Follow  instructions from your health care provider about eating or drinking restrictions.  Remove your jewelry on the day of the procedure. What happens during the procedure?  An IV will be inserted into one of your veins.  Your health care provider will inject a small amount of radioactive tracer through the IV.  You will wait for 20-40 minutes while the tracer travels through your bloodstream.  Your heart activity will be monitored with an electrocardiogram (ECG).  You will lie down on an exam table.  Images of your heart will be taken for about 15-20 minutes.  You may also have a stress test. For this test, one of the following may be done: ? You will exercise on a treadmill or stationary bike. While you exercise, your heart's activity will be monitored with an ECG, and your blood pressure will be checked. ? You will be given medicines that will increase blood flow to parts of your heart. This is done if you are unable to exercise.  When blood flow to your heart has peaked, a tracer will again be injected through the IV.  After 20-40 minutes, you will get back on the exam table and have more images taken of your heart.  Depending on the type of tracer used, scans may need to be repeated 3-4 hours later.  Your IV line will be removed when the procedure is over. The procedure may vary among health care providers and hospitals. What happens after the procedure?  Unless your health care provider tells you otherwise, you may return to your normal schedule, including diet, activities, and medicines.  Unless your health care provider tells you otherwise, you may increase your fluid intake. This will help to flush the contrast dye from your body. Drink enough fluid to keep your urine pale yellow.  Ask your health care provider, or the department that is doing the test: ? When will my results be ready? ? How will I get my results? Summary  A cardiac nuclear scan measures the blood flow to  the heart when a person is resting and when he or she is exercising.  Tell your health care provider if you are pregnant.  Before the procedure, ask your health care provider about changing or stopping your regular medicines. This is especially important if you are taking diabetes medicines or blood thinners.  After the procedure, unless your health care provider tells you otherwise, increase your fluid intake. This will help flush the contrast dye from your body.  After the procedure, unless your health care provider tells you otherwise, you may return to your normal schedule, including diet, activities, and medicines. This information is not intended to replace advice given to you by your health care provider. Make sure you discuss any questions you have with your health care provider. Document Revised: 04/28/2018 Document Reviewed: 04/28/2018 Elsevier Patient Education  2020 [employer].   Echocardiogram An echocardiogram is a procedure that uses painless sound waves (ultrasound) to produce an image of the heart. Images from an echocardiogram can provide important information about:  Signs of coronary artery disease (CAD).  Aneurysm detection. An aneurysm is a weak  or damaged part of an artery wall that bulges out from the normal force of blood pumping through the body.  Heart size and shape. Changes in the size or shape of the heart can be associated with certain conditions, including heart failure, aneurysm, and CAD.  Heart muscle function.  Heart valve function.  Signs of a past heart attack.  Fluid buildup around the heart.  Thickening of the heart muscle.  A tumor or infectious growth around the heart valves. Tell a health care provider about:  Any allergies you have.  All medicines you are taking, including vitamins, herbs, eye drops, creams, and over-the-counter medicines.  Any blood disorders you have.  Any surgeries you have had.  Any medical conditions you  have.  Whether you are pregnant or may be pregnant. What are the risks? Generally, this is a safe procedure. However, problems may occur, including:  Allergic reaction to dye (contrast) that may be used during the procedure. What happens before the procedure? No specific preparation is needed. You may eat and drink normally. What happens during the procedure?   An IV tube may be inserted into one of your veins.  You may receive contrast through this tube. A contrast is an injection that improves the quality of the pictures from your heart.  A gel will be applied to your chest.  A wand-like tool (transducer) will be moved over your chest. The gel will help to transmit the sound waves from the transducer.  The sound waves will harmlessly bounce off of your heart to allow the heart images to be captured in real-time motion. The images will be recorded on a computer. The procedure may vary among health care providers and hospitals. What happens after the procedure?  You may return to your normal, everyday life, including diet, activities, and medicines, unless your health care provider tells you not to do that. Summary  An echocardiogram is a procedure that uses painless sound waves (ultrasound) to produce an image of the heart.  Images from an echocardiogram can provide important information about the size and shape of your heart, heart muscle function, heart valve function, and fluid buildup around your heart.  You do not need to do anything to prepare before this procedure. You may eat and drink normally.  After the echocardiogram is completed, you may return to your normal, everyday life, unless your health care provider tells you not to do that. This information is not intended to replace advice given to you by your health care provider. Make sure you discuss any questions you have with your health care provider. Document Revised: 03/05/2019 Document Reviewed: 12/15/2016 Elsevier  Patient Education  2020 [employer].

## 2019-12-11 NOTE — Progress Notes (Signed)
Cardiology Office Note:    Date:  12/11/2019   ID:  Steven Ford, DOB Oct 31, 1961, MRN [id number]  PCP:  Patient, No Pcp Per  Cardiologist:  Gypsy Balsam, MD    Referring MD: No ref. provider found   Chief Complaint  Patient presents with  . Surgery Clearance  I need knee replacement surgery  History of Present Illness:    Steven Ford is a 58 y.o. male with past medical history significant for premature coronary artery disease, status post coronary artery bypass graft done in 2002, also peripheral vascular disease with last SFA intervention done in 2019.  He also got rectal dysfunction as well as dyslipidemia.  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  He does have chronic right knee pain and he reached a point that he wants to have knee surgery done.  Interestingly he still very active he still exercise a lot and have does not have any difficulty doing it.  He came to me to be evaluated before the surgery.  Overall knee surgery is considered low risk surgery however anesthesia that we need to use for it either spinal or general carries some risks overall I think he need to be evaluated from cardiac standpoint review before the surgery I think he need to have a stress test as well as echocardiogram.  To his that he does not have any chest pain tightness pressure burning in the chest pain is fairly athletic but at the same time he was doing great before his bypass surgery in 2002 until the day before surgery when he started having chest pain.  Therefore I am not sure if we can rely only on his symptoms.  We need to look at the situation objectively and stress test and echocardiogram will allow this to do that.  Past Medical History:  Diagnosis Date  . Coronary artery disease   . High cholesterol   . Hyperlipidemia   . Hypertension   . Migraine    "none in a long long while" (09/04/2018)  . PVD (peripheral vascular disease) (HCC)     Past Surgical History:  Procedure  Laterality Date  . CORONARY ARTERY BYPASS GRAFT  2002   "triple"  . KNEE ARTHROSCOPY Right 2019  . LOWER EXTREMITY ANGIOGRAPHY N/A 08/28/2018   Procedure: LOWER EXTREMITY ANGIOGRAPHY;  Surgeon: Runell Gess, MD;  [facility];  Service: Cardiovascular;  Laterality: N/A;  . LOWER EXTREMITY ANGIOGRAPHY N/A 09/04/2018   Procedure: LOWER EXTREMITY ANGIOGRAPHY;  Surgeon: Runell Gess, MD;  Location: [facility];  Service: Cardiovascular;  Laterality: N/A;  . PERIPHERAL VASCULAR INTERVENTION Left 09/04/2018  . PERIPHERAL VASCULAR INTERVENTION Left 09/04/2018   Procedure: PERIPHERAL VASCULAR INTERVENTION;  Surgeon: Runell Gess, MD;  Location: [facility];  Service: Cardiovascular;  Laterality: Left;    Current Medications: Current Meds  Medication Sig  . aspirin EC 81 MG tablet Take 81 mg by mouth daily.  Marland Kitchen lisinopril (ZESTRIL) 5 MG tablet TAKE 1 TABLET BY MOUTH EVERY DAY  . meloxicam (MOBIC) 15 MG tablet Take 1 tablet by mouth daily as needed for pain.   . metoprolol succinate (TOPROL-XL) 25 MG 24 hr tablet TAKE 1 TABLET BY MOUTH EVERY DAY  . Multiple Vitamins-Minerals (MULTIVITAMIN WITH MINERALS) tablet Take 1 tablet by mouth daily.  . tadalafil (CIALIS) 20 MG tablet Take 1 tablet (20 mg total) by mouth daily as needed for erectile dysfunction.     Allergies:  Influenza vaccines   Social History   Socioeconomic History  . Marital status: Married    Spouse name: Not on file  . Number of children: Not on file  . Years of education: Not on file  . Highest education level: Not on file  Occupational History  . Not on file  Tobacco Use  . Smoking status: Light Tobacco Smoker    Years: 5.00    Types: Cigars  . Smokeless tobacco: Never Used  . Tobacco comment: 09/04/2018 "q time I play golf"  Substance and Sexual Activity  . Alcohol use: Yes    Alcohol/week: 2.0 standard drinks    Types: 2 Cans of beer per week  . Drug use: No  . Sexual  activity: Yes  Other Topics Concern  . Not on file  Social History Narrative  . Not on file   Social Determinants of Health   Financial Resource Strain:   . Difficulty of Paying Living Expenses: Not on file  Food Insecurity:   . Worried About Charity fundraiser in the Last Year: Not on file  . Ran Out of Food in the Last Year: Not on file  Transportation Needs:   . Lack of Transportation (Medical): Not on file  . Lack of Transportation (Non-Medical): Not on file  Physical Activity:   . Days of Exercise per Week: Not on file  . Minutes of Exercise per Session: Not on file  Stress:   . Feeling of Stress : Not on file  Social Connections:   . Frequency of Communication with Friends and Family: Not on file  . Frequency of Social Gatherings with Friends and Family: Not on file  . Attends Religious Services: Not on file  . Active Member of Clubs or Organizations: Not on file  . Attends Archivist Meetings: Not on file  . Marital Status: Not on file     Family History: The patient's family history includes Anemia in his father; Heart disease in his mother. ROS:   Please see the history of present illness.    All 14 point review of systems negative except as described per history of present illness  EKGs/Labs/Other Studies Reviewed:    His EKG showed sinus bradycardia rate of 50.  No PR interval 214.  Normal QS complex duration morphology otherwise  Recent Labs: No results found for requested labs within last 8760 hours.  Recent Lipid Panel    Component Value Date/Time   CHOL 203 (H) 07/15/2018 0934   TRIG 54 07/15/2018 0934   HDL 45 07/15/2018 0934   CHOLHDL 4.5 07/15/2018 0934   LDLCALC 147 (H) 07/15/2018 0934    Physical Exam:    VS:  BP (!) 140/92   Pulse (!) 50   Ht 5\' 10"  (1.778 m)   Wt 207 lb (93.9 kg)   SpO2 94%   BMI 29.70 kg/m     Wt Readings from Last 3 Encounters:  12/11/19 207 lb (93.9 kg)  09/22/18 208 lb 6.4 oz (94.5 kg)  09/17/18 204  lb (92.5 kg)     GEN:  Well nourished, well developed in no acute distress HEENT: Normal NECK: No JVD; No carotid bruits LYMPHATICS: No lymphadenopathy CARDIAC: RRR, no murmurs, no rubs, no gallops RESPIRATORY:  Clear to auscultation without rales, wheezing or rhonchi  ABDOMEN: Soft, non-tender, non-distended MUSCULOSKELETAL:  No edema; No deformity pulses are weak. SKIN: Warm and dry LOWER EXTREMITIES: no swelling NEUROLOGIC:  Alert and oriented x 3 PSYCHIATRIC:  Normal affect   ASSESSMENT:    1. Coronary artery disease involving native coronary artery of native heart without angina pectoris   2. Status post coronary artery bypass graft   3. Claudication in peripheral vascular disease (HCC)   4. Dyslipidemia    PLAN:    In order of problems listed above:  1. Coronary artery disease.  Doing well from that point review getting ready for surgery his ability to exercise is limited because of orthopedic problems.  Overall however he is quite athletic but because of the fact that previously before his bypass surgery he did not have any symptoms, the fact that surgery has been done 18 years ago we need to look at his coronary arteries we will do it by doing stress test.  As a part of evaluation echocardiogram will be done as well 2. History of claudication status post left SFA stenting.  Doing great from that point of view.  On the physical examination I feel very weak pulses both legs.  We will schedule him to have arterial duplex of lower extremities. 3. Dyslipidemia he is on Crestor 40 mg which is high intensity statin as well as evolocumab.  We will continue.  We will check his fasting lipid profile.   Medication Adjustments/Labs and Tests Ordered: Current medicines are reviewed at length with the patient today.  Concerns regarding medicines are outlined above.  No orders of the defined types were placed in this encounter.  Medication changes: No orders of the defined types were  placed in this encounter.   Signed, Georgeanna Lea, MD, [facility] 12/11/2019 10:38 AM    [facility]

## 2019-12-14 ENCOUNTER — Encounter (HOSPITAL_COMMUNITY): Payer: BC Managed Care – PPO

## 2019-12-14 ENCOUNTER — Telehealth: Payer: Self-pay

## 2019-12-14 MED ORDER — PRALUENT 150 MG/ML ~~LOC~~ SOAJ: 150.0000 mg | SUBCUTANEOUS | 11 refills | Status: DC

## 2019-12-14 NOTE — Telephone Encounter (Signed)
Called and spoke w/pt regarding the approval of the praluent and rx sent, pt voiced understanding

## 2019-12-18 ENCOUNTER — Telehealth (HOSPITAL_COMMUNITY): Payer: Self-pay | Admitting: *Deleted

## 2019-12-18 NOTE — Telephone Encounter (Signed)
Left message on voicemail per DPR in reference to upcoming appointment scheduled on 12/21/19 at 10:15 with detailed instructions given per Myocardial Perfusion Study Information Sheet for the test. LM to arrive 15 minutes early, and that it is imperative to arrive on time for appointment to keep from having the test rescheduled. If you need to cancel or reschedule your appointment, please call the office within 24 hours of your appointment. Failure to do so may result in a cancellation of your appointment, and a $50 no show fee. Phone number given for call back for any questions.

## 2019-12-21 ENCOUNTER — Ambulatory Visit (HOSPITAL_COMMUNITY): Payer: BC Managed Care – PPO | Attending: Cardiology

## 2019-12-21 ENCOUNTER — Ambulatory Visit (HOSPITAL_BASED_OUTPATIENT_CLINIC_OR_DEPARTMENT_OTHER): Payer: BC Managed Care – PPO

## 2019-12-21 ENCOUNTER — Other Ambulatory Visit: Payer: Self-pay

## 2019-12-21 VITALS — Ht 70.0 in | Wt 207.0 lb

## 2019-12-21 DIAGNOSIS — I2510 Atherosclerotic heart disease of native coronary artery without angina pectoris: Secondary | ICD-10-CM

## 2019-12-21 DIAGNOSIS — Z951 Presence of aortocoronary bypass graft: Secondary | ICD-10-CM | POA: Diagnosis not present

## 2019-12-21 DIAGNOSIS — Z01810 Encounter for preprocedural cardiovascular examination: Secondary | ICD-10-CM | POA: Diagnosis not present

## 2019-12-21 DIAGNOSIS — I739 Peripheral vascular disease, unspecified: Secondary | ICD-10-CM | POA: Diagnosis not present

## 2019-12-21 DIAGNOSIS — E785 Hyperlipidemia, unspecified: Secondary | ICD-10-CM | POA: Insufficient documentation

## 2019-12-21 LAB — MYOCARDIAL PERFUSION IMAGING
LV dias vol: 171 mL (ref 62–150)
LV sys vol: 101 mL
Peak HR: 76 {beats}/min
Rest HR: 50 {beats}/min
SDS: 0
SRS: 0
SSS: 0
TID: 1.01

## 2019-12-21 MED ORDER — TECHNETIUM TC 99M TETROFOSMIN IV KIT
10.7000 | PACK | Freq: Once | INTRAVENOUS | Status: AC | PRN
  Administered 2019-12-21: 10.7 via INTRAVENOUS
  Filled 2019-12-21: qty 11

## 2019-12-21 MED ORDER — TECHNETIUM TC 99M TETROFOSMIN IV KIT
31.9000 | PACK | Freq: Once | INTRAVENOUS | Status: AC | PRN
  Administered 2019-12-21: 31.9 via INTRAVENOUS
  Filled 2019-12-21: qty 32

## 2019-12-21 MED ORDER — REGADENOSON 0.4 MG/5ML IV SOLN
0.4000 mg | Freq: Once | INTRAVENOUS | Status: AC
  Administered 2019-12-21: 0.4 mg via INTRAVENOUS

## 2019-12-23 ENCOUNTER — Other Ambulatory Visit (HOSPITAL_COMMUNITY): Payer: BC Managed Care – PPO

## 2019-12-24 ENCOUNTER — Ambulatory Visit (HOSPITAL_COMMUNITY)
Admission: RE | Admit: 2019-12-24 | Discharge: 2019-12-24 | Disposition: A | Discharge status: Home / Self Care | Payer: BC Managed Care – PPO | Source: Ambulatory Visit | Attending: Internal Medicine | Admitting: Internal Medicine

## 2019-12-24 ENCOUNTER — Other Ambulatory Visit: Payer: Self-pay

## 2019-12-24 DIAGNOSIS — Z95820 Peripheral vascular angioplasty status with implants and grafts: Secondary | ICD-10-CM | POA: Insufficient documentation

## 2019-12-24 DIAGNOSIS — I739 Peripheral vascular disease, unspecified: Secondary | ICD-10-CM | POA: Diagnosis not present

## 2019-12-25 ENCOUNTER — Telehealth: Payer: Self-pay

## 2019-12-25 DIAGNOSIS — I739 Peripheral vascular disease, unspecified: Secondary | ICD-10-CM

## 2019-12-25 NOTE — Telephone Encounter (Signed)
-----   Message from Runell Gess, MD sent at 12/25/2019 11:08 AM EST ----- ABI is lower since prior study.  Please order lower extremity arterial Doppler studies

## 2019-12-26 ENCOUNTER — Other Ambulatory Visit: Payer: Self-pay | Admitting: Cardiology

## 2019-12-30 ENCOUNTER — Telehealth: Payer: Self-pay | Admitting: Cardiovascular Disease

## 2019-12-30 NOTE — Telephone Encounter (Signed)
Left message for patient to call and schedule overdue follow up appointment with Dr. Berry 

## 2019-12-31 ENCOUNTER — Other Ambulatory Visit: Payer: Self-pay

## 2019-12-31 ENCOUNTER — Ambulatory Visit (INDEPENDENT_AMBULATORY_CARE_PROVIDER_SITE_OTHER): Payer: BC Managed Care – PPO | Admitting: Cardiology

## 2019-12-31 ENCOUNTER — Encounter: Payer: Self-pay | Admitting: Cardiology

## 2019-12-31 VITALS — BP 132/86 | HR 50 | Ht 70.0 in | Wt 208.4 lb

## 2019-12-31 DIAGNOSIS — I739 Peripheral vascular disease, unspecified: Secondary | ICD-10-CM

## 2019-12-31 DIAGNOSIS — E785 Hyperlipidemia, unspecified: Secondary | ICD-10-CM

## 2019-12-31 DIAGNOSIS — Z01810 Encounter for preprocedural cardiovascular examination: Secondary | ICD-10-CM | POA: Diagnosis not present

## 2019-12-31 DIAGNOSIS — I2510 Atherosclerotic heart disease of native coronary artery without angina pectoris: Secondary | ICD-10-CM

## 2019-12-31 DIAGNOSIS — Z951 Presence of aortocoronary bypass graft: Secondary | ICD-10-CM | POA: Diagnosis not present

## 2019-12-31 NOTE — Addendum Note (Signed)
Addended by: Lita Mains on: 12/31/2019 04:08 PM   Modules accepted: Orders

## 2019-12-31 NOTE — Progress Notes (Signed)
Cardiology Office Note:    Date:  12/31/2019   ID:  Steven Ford, DOB Jul 14, 1961, MRN [id number]  PCP:  Patient, No Pcp Per  Cardiologist:  Gypsy Balsam, MD    Referring MD: No ref. provider found   Chief Complaint  Patient presents with  . Follow-up    Discuss Echo Results     History of Present Illness:    Steven Ford is a 59 y.o. male history, that involved coronary artery disease status post coronary bypass graft only 2002.  Also peripheral vascular disease with intervention on his left SFA done in October 2019.  He also does have hypertension as well as hyperlipidemia.  He was getting ready to have right knee replacement surgery because of advanced osteoarthritis.  As a part of evaluation and updating stress test as well as echocardiogram.  He also got arterial duplex of lower extremities as a regular follow-up.  We identified few important abnormalities.  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  His [employer] [place] [employer] would be 1.  He is already on lisinopril.  Ask him to have Chem-7 done his Chem-7 is fine done with doubled his dose of lisinopril.  I cannot increase beta-blocker because of him having bradycardia of 50 at rest.  We also talked in length about his right knee pain.  It is difficult to judge if the pain is only because of osteoarthritis of there is also some vascular pain there.  I will refer him back to Dr. Gery Pray  Past Medical History:  Diagnosis Date  . Coronary artery disease   . High cholesterol   . Hyperlipidemia   . Hypertension   . Migraine    "none in a long long while" (09/04/2018)  . PVD (peripheral vascular disease) (HCC)     Past Surgical History:  Procedure Laterality Date  . CORONARY ARTERY BYPASS GRAFT  2002   "triple"  . KNEE ARTHROSCOPY Right 2019  . LOWER  EXTREMITY ANGIOGRAPHY N/A 08/28/2018   Procedure: LOWER EXTREMITY ANGIOGRAPHY;  Surgeon: Runell Gess, MD;  Location: [facility];  Service: Cardiovascular;  Laterality: N/A;  . LOWER EXTREMITY ANGIOGRAPHY N/A 09/04/2018   Procedure: LOWER EXTREMITY ANGIOGRAPHY;  Surgeon: Runell Gess, MD;  Location: [facility];  Service: Cardiovascular;  Laterality: N/A;  . PERIPHERAL VASCULAR INTERVENTION Left 09/04/2018  . PERIPHERAL VASCULAR INTERVENTION Left 09/04/2018   Procedure: PERIPHERAL VASCULAR INTERVENTION;  Surgeon: Runell Gess, MD;  Location: [facility];  Service: Cardiovascular;  Laterality: Left;    Current Medications: Current Meds  Medication Sig  . Alirocumab (PRALUENT) 150 MG/ML SOAJ Inject 150 mg into the skin every 14 (fourteen) days.  Marland Kitchen aspirin EC 81 MG tablet Take 81 mg by mouth daily.  . clopidogrel (PLAVIX) 75 MG tablet Take 1 tablet (75 mg total) by mouth daily with breakfast.  . lisinopril (ZESTRIL) 5 MG tablet TAKE 1 TABLET BY MOUTH EVERY DAY  . meloxicam (MOBIC) 15 MG tablet Take 1 tablet by mouth daily as needed for pain.   . Multiple Vitamins-Minerals (MULTIVITAMIN WITH MINERALS) tablet Take 1 tablet by mouth daily.  . tadalafil (CIALIS) 20 MG tablet Take 1 tablet (20 mg total) by mouth daily as needed for erectile dysfunction.     Allergies:   Influenza vaccines  Social History   Socioeconomic History  . Marital status: Married    Spouse name: Not on file  . Number of children: Not on file  . Years of education: Not on file  . Highest education level: Not on file  Occupational History  . Not on file  Tobacco Use  . Smoking status: Light Tobacco Smoker    Years: 5.00    Types: Cigars  . Smokeless tobacco: Never Used  . Tobacco comment: 09/04/2018 "q time I play golf"  Substance and Sexual Activity  . Alcohol use: Yes    Alcohol/week: 2.0 standard drinks    Types: 2 Cans of beer per week  . Drug use: No  . Sexual  activity: Yes  Other Topics Concern  . Not on file  Social History Narrative  . Not on file   Social Determinants of Health   Financial Resource Strain:   . Difficulty of Paying Living Expenses: Not on file  Food Insecurity:   . Worried About Charity fundraiser in the Last Year: Not on file  . Ran Out of Food in the Last Year: Not on file  Transportation Needs:   . Lack of Transportation (Medical): Not on file  . Lack of Transportation (Non-Medical): Not on file  Physical Activity:   . Days of Exercise per Week: Not on file  . Minutes of Exercise per Session: Not on file  Stress:   . Feeling of Stress : Not on file  Social Connections:   . Frequency of Communication with Friends and Family: Not on file  . Frequency of Social Gatherings with Friends and Family: Not on file  . Attends Religious Services: Not on file  . Active Member of Clubs or Organizations: Not on file  . Attends Archivist Meetings: Not on file  . Marital Status: Not on file     Family History: The patient's family history includes Anemia in his father; Heart disease in his mother. ROS:   Please see the history of present illness.    All 14 point review of systems negative except as described per history of present illness  EKGs/Labs/Other Studies Reviewed:      Recent Labs: No results found for requested labs within last 8760 hours.  Recent Lipid Panel    Component Value Date/Time   CHOL 192 12/11/2019 1053   TRIG 62 12/11/2019 1053   HDL 47 12/11/2019 1053   CHOLHDL 4.1 12/11/2019 1053   LDLCALC 133 (H) 12/11/2019 1053    Physical Exam:    VS:  BP 132/86   Pulse (!) 50   Ht 5\' 10"  (1.778 m)   Wt 208 lb 6.4 oz (94.5 kg)   SpO2 99%   BMI 29.90 kg/m     Wt Readings from Last 3 Encounters:  12/31/19 208 lb 6.4 oz (94.5 kg)  12/21/19 207 lb (93.9 kg)  12/11/19 207 lb (93.9 kg)     GEN:  Well nourished, well developed in no acute distress HEENT: Normal NECK: No JVD; No  carotid bruits LYMPHATICS: No lymphadenopathy CARDIAC: RRR, no murmurs, no rubs, no gallops RESPIRATORY:  Clear to auscultation without rales, wheezing or rhonchi  ABDOMEN: Soft, non-tender, non-distended MUSCULOSKELETAL:  No edema; No deformity  SKIN: Warm and dry LOWER EXTREMITIES: no swelling NEUROLOGIC:  Alert and oriented x 3 PSYCHIATRIC:  Normal affect   ASSESSMENT:    No diagnosis found. PLAN:    In order of problems listed above:  1. Coronary disease,  status post coronary bypass graft years ago.  Recent stress test showed no evidence of ischemia 2. Cardiomyopathy: I will increase dose of ACE inhibitor.  His neurocardiac cessation was 1.  He is already on beta-blocker, only small dose secondary to the fact that he does have resting bradycardia. 3. 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  I will refer him back to Dr. Allyson Sabal for evaluation for potential intervention before surgery for right knee replacement.   Medication Adjustments/Labs and Tests Ordered: Current medicines are reviewed at length with the patient today.  Concerns regarding medicines are outlined above.  No orders of the defined types were placed in this encounter.  Medication changes: No orders of the defined types were placed in this encounter.   Signed, Georgeanna Lea, MD, [facility] 12/31/2019 4:01 PM    [facility]

## 2019-12-31 NOTE — Patient Instructions (Signed)
Medication Instructions:  Your physician recommends that you continue on your current medications as directed. Please refer to the Current Medication list given to you today.  *If you need a refill on your cardiac medications before your next appointment, please call your pharmacy*  Lab Work: Your physician recommends that you return for lab work today: bmp   If you have labs (blood work) drawn today and your tests are completely normal, you will receive your results only by: Marland Kitchen MyChart Message (if you have MyChart) OR . A paper copy in the mail If you have any lab test that is abnormal or we need to change your treatment, we will call you to review the results.  Testing/Procedures: None.   Follow-Up: At [facility], you and your health needs are our priority.  As part of our continuing mission to provide you with exceptional heart care, we have created designated Provider Care Teams.  These Care Teams include your primary Cardiologist (physician) and Advanced Practice Providers (APPs -  Physician Assistants and Nurse Practitioners) who all work together to provide you with the care you need, when you need it.  Your next appointment:   1 month(s)  The format for your next appointment:   In Person  Provider:   Gypsy Balsam, MD  Other Instructions   Dr. Bing Matter also advises you to follow up with Dr. Allyson Sabal

## 2020-01-01 LAB — BASIC METABOLIC PANEL
BUN/Creatinine Ratio: 16 (ref 9–20)
BUN: 15 mg/dL (ref 6–24)
CO2: 24 mmol/L (ref 20–29)
Calcium: 8.7 mg/dL (ref 8.7–10.2)
Chloride: 105 mmol/L (ref 96–106)
Creatinine, Ser: 0.91 mg/dL (ref 0.76–1.27)
GFR calc Af Amer: 107 mL/min/{1.73_m2} (ref >59)
GFR calc non Af Amer: 93 mL/min/{1.73_m2} (ref >59)
Glucose: 99 mg/dL (ref 65–99)
Potassium: 4.9 mmol/L (ref 3.5–5.2)
Sodium: 142 mmol/L (ref 134–144)

## 2020-01-05 ENCOUNTER — Telehealth: Payer: Self-pay | Admitting: Emergency Medicine

## 2020-01-05 DIAGNOSIS — I2510 Atherosclerotic heart disease of native coronary artery without angina pectoris: Secondary | ICD-10-CM

## 2020-01-05 MED ORDER — LISINOPRIL 10 MG PO TABS: 10.0000 mg | ORAL_TABLET | Freq: Every day | ORAL | 2 refills | Status: DC

## 2020-01-05 NOTE — Telephone Encounter (Signed)
Called patient informed him per Dr. Bing Matter of his lab work and to increase lisinopril to 10 mg daily. Patient was also informed to come back in 1 week for repeat bmp labs. He verbally understood. No further questions.

## 2020-01-13 ENCOUNTER — Ambulatory Visit: Payer: BC Managed Care – PPO | Admitting: Cardiovascular Disease

## 2020-01-14 ENCOUNTER — Telehealth: Payer: Self-pay | Admitting: *Deleted

## 2020-01-14 ENCOUNTER — Telehealth (INDEPENDENT_AMBULATORY_CARE_PROVIDER_SITE_OTHER): Payer: BC Managed Care – PPO | Admitting: Cardiovascular Disease

## 2020-01-14 VITALS — Ht 70.5 in | Wt 199.0 lb

## 2020-01-14 DIAGNOSIS — I739 Peripheral vascular disease, unspecified: Secondary | ICD-10-CM | POA: Diagnosis not present

## 2020-01-14 DIAGNOSIS — Z01812 Encounter for preprocedural laboratory examination: Secondary | ICD-10-CM

## 2020-01-14 NOTE — Progress Notes (Signed)
Virtual Visit via Telephone Note   This visit type was conducted due to national recommendations for restrictions regarding the COVID-19 Pandemic (e.g. social distancing) in an effort to limit this patient's exposure and mitigate transmission in our community.  Due to his co-morbid illnesses, this patient is at least at moderate risk for complications without adequate follow up.  This format is felt to be most appropriate for this patient at this time.  The patient did not have access to video technology/had technical difficulties with video requiring transitioning to audio format only (telephone).  All issues noted in this document were discussed and addressed.  No physical exam could be performed with this format.  Please refer to the patient's chart for his  consent to telehealth for [facility].   Date:  01/14/2020   ID:  Steven Ford, DOB December 14, 1956, MRN [id number]  Patient Location: Home Provider Location: Home  PCP:  Patient, No Pcp Per  Cardiologist:  Gypsy Balsam, MD Peripheral vascular cardiologist: Dr. Nanetta Batty Electrophysiologist:  None   Evaluation Performed:  Follow-Up Visit  Chief Complaint: Right lower extremity discomfort/PAD  History of Present Illness:    Steven Ford is a 59 y.o. married African-American male father for, grandfather of 2 grandchildren referred by Dr. Bing Matter for peripheral vascular evaluation because of lifestyle limiting claudication. 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  I last saw him in the office 09/17/2018. His risk factors include treated hypertension hyperlipidemia. Mother did have a myocardial infarction. He had bypass grafting x3 in 2002 and is been asymptomatic from this since. He works out 3-4 times a week doing cardio and bicycle riding and is done this since his bypass surgery. Over the last year he is noticed increasing left lower extremity claudication which is lifestyle limiting  with recent Dopplers that showed a high-frequency signal in his distal left SFA. I performed initial angiography on him 08/28/2018 revealing high-grade calcified segmental bilateral mid to distal SFA stenoses with three-vessel runoff.  I brought him back on 09/04/2018 and performed diamondback orbital rotational atherectomy, drug-coated balloon angioplasty and Supera stenting of his entire mid to distal left SFA with an excellent angiographic and clinical result.  His follow-up Dopplers normalized and his claudication has resolved.  He continues to say that he has no symptoms on the right side.  He apparently needs a right total knee replacement to be done by Dr. Thurston Hole .  Unfortunately, with this degree of arterial insufficiency I am concerned that his surgical wound would not heal.  I have discussed this with Dr. Thurston Hole who agrees.  Based on this I am arranging for him to undergo peripheral angiography and right lower extremity endovascular therapy to improve blood flow and subsequently allow him to have his right total knee replacement.    The patient does not have symptoms concerning for COVID-19 infection (fever, chills, cough, or new shortness of breath).    Past Medical History:  Diagnosis Date  . Coronary artery disease   . High cholesterol   . Hyperlipidemia   . Hypertension   . Migraine    "none in a long long while" (09/04/2018)  . PVD (peripheral vascular disease) (HCC)    Past Surgical History:  Procedure Laterality Date  . CORONARY ARTERY BYPASS GRAFT  2002   "triple"  . KNEE ARTHROSCOPY Right 2019  . LOWER EXTREMITY ANGIOGRAPHY N/A 08/28/2018   Procedure: LOWER EXTREMITY ANGIOGRAPHY;  Surgeon: Runell Gess, MD;  Location: [facility]  INVASIVE CV LAB;  Service: Cardiovascular;  Laterality: N/A;  . LOWER EXTREMITY ANGIOGRAPHY N/A 09/04/2018   Procedure: LOWER EXTREMITY ANGIOGRAPHY;  Surgeon: Lorretta Harp, MD;  Location: [place] CV LAB;  Service: Cardiovascular;   Laterality: N/A;  . PERIPHERAL VASCULAR INTERVENTION Left 09/04/2018  . PERIPHERAL VASCULAR INTERVENTION Left 09/04/2018   Procedure: PERIPHERAL VASCULAR INTERVENTION;  Surgeon: Lorretta Harp, MD;  Location: [place] CV LAB;  Service: Cardiovascular;  Laterality: Left;     Current Meds  Medication Sig  . Alirocumab (PRALUENT) 150 MG/ML SOAJ Inject 150 mg into the skin every 14 (fourteen) days.  Marland Kitchen aspirin EC 81 MG tablet Take 81 mg by mouth daily.  . clopidogrel (PLAVIX) 75 MG tablet Take 1 tablet (75 mg total) by mouth daily with breakfast.  . lisinopril (ZESTRIL) 10 MG tablet Take 1 tablet (10 mg total) by mouth daily.  . meloxicam (MOBIC) 15 MG tablet Take 1 tablet by mouth daily as needed for pain.   . Multiple Vitamins-Minerals (MULTIVITAMIN WITH MINERALS) tablet Take 1 tablet by mouth daily.  . tadalafil (CIALIS) 20 MG tablet Take 1 tablet (20 mg total) by mouth daily as needed for erectile dysfunction.     Allergies:   Influenza vaccines   Social History   Tobacco Use  . Smoking status: Light Tobacco Smoker    Years: 5.00    Types: Cigars  . Smokeless tobacco: Never Used  . Tobacco comment: 09/04/2018 "q time I play golf"  Substance Use Topics  . Alcohol use: Yes    Alcohol/week: 2.0 standard drinks    Types: 2 Cans of beer per week  . Drug use: No     Family Hx: The patient's family history includes Anemia in his father; Heart disease in his mother.  ROS:   Please see the history of present illness.     All other systems reviewed and are negative.   Prior CV studies:   The following studies were reviewed today:  Lower extremity Dopplers performed 12/24/2019  Labs/Other Tests and Data Reviewed:    EKG:  No ECG reviewed.  Recent Labs: 12/31/2019: BUN 15; Creatinine, Ser 0.91; Potassium 4.9; Sodium 142   Recent Lipid Panel Lab Results  Component Value Date/Time   CHOL 192 12/11/2019 10:53 AM   TRIG 62 12/11/2019 10:53 AM   HDL 47 12/11/2019 10:53 AM    CHOLHDL 4.1 12/11/2019 10:53 AM   LDLCALC 133 (H) 12/11/2019 10:53 AM    Wt Readings from Last 3 Encounters:  01/14/20 199 lb (90.3 kg)  12/31/19 208 lb 6.4 oz (94.5 kg)  12/21/19 207 lb (93.9 kg)     Objective:    Vital Signs:  Ht 5' 10.5" (1.791 m)   Wt 199 lb (90.3 kg)   BMI 28.15 kg/m    VITAL SIGNS:  reviewed  ASSESSMENT & PLAN:    1. Peripheral arterial disease-history of PAD status post complex endovascular procedure performed 09/04/2018 with diamondback orbital rotational atherectomy, drug-coated balloon angioplasty and SUPERA self expanding stent therapy resulting in excellent angiographic and clinical result.  His Dopplers normalized.  He no longer has claudication on the left side.  His recent lower extremity arterial Doppler studies performed 12/24/2019 revealed a left ABI 0.93 and a right ABI 0.78 with a high-frequency signal in his mid right SFA.  His angiogram which was originally performed 08/28/2018 revealed high-grade diffusely diseased calcified right SFA stenosis with three-vessel runoff.  In order to for him to have a right total  knee replacement he will need revascularization of his SFA to allow healing of the surgical wound.  COVID-19 Education: The signs and symptoms of COVID-19 were discussed with the patient and how to seek care for testing (follow up with PCP or arrange E-visit).  The importance of social distancing was discussed today.  Time:   Today, I have spent 10 minutes with the patient with telehealth technology discussing the above problems.     Medication Adjustments/Labs and Tests Ordered: Current medicines are reviewed at length with the patient today.  Concerns regarding medicines are outlined above.   Tests Ordered: No orders of the defined types were placed in this encounter.   Medication Changes: No orders of the defined types were placed in this encounter.   Follow Up:  In Person in 1 month(s)  Signed, Nanetta Batty, MD   01/14/2020 3:43 PM    [facility]

## 2020-01-14 NOTE — Patient Instructions (Addendum)
Medication Instructions:  Your physician recommends that you continue on your current medications as directed. Please refer to the Current Medication list given to you today.  *If you need a refill on your cardiac medications before your next appointment, please call your pharmacy*  Lab Work: BMET/CBC BEFORE YOU GO FOR YOUR COVID TEST   COVID SCREENING TEST Monday March 1 3:05 PM (Arrive by 2:50 PM) This is a Drive Up Visit at the [facility] [address], [place]. Someone will direct you to the appropriate testing line. Stay in your car and someone will be with you shortly.  If you have labs (blood work) drawn today and your tests are completely normal, you will receive your results only by: Marland Kitchen MyChart Message (if you have MyChart) OR . A paper copy in the mail If you have any lab test that is abnormal or we need to change your treatment, we will call you to review the results.  Testing/Procedures:  Your physician has requested that you have a peripheral vascular angiogram. This exam is performed at the hospital. During this exam IV contrast is used to look at arterial blood flow. Please review the information sheet given for details.  Your physician has requested that you have an ankle brachial index (ABI). During this test an ultrasound and blood pressure cuff are used to evaluate the arteries that supply the arms and legs with blood. Allow thirty minutes for this exam. There are no restrictions or special instructions.  Your physician has requested that you have a lower or upper extremity arterial duplex. This test is an ultrasound of the arteries in the legs or arms. It looks at arterial blood flow in the legs and arms. Allow one hour for Lower and Upper Arterial scans. There are no restrictions or special instructions  BOTH OF THE ABOVE WILL BE 1 WEEK AFTER YOUR PROCEDURE PRIOR TO YOU FOLLOW UP APPOINTMENT   Follow-Up: At [facility], you and your health needs  are our priority.  As part of our continuing mission to provide you with exceptional heart care, we have created designated Provider Care Teams.  These Care Teams include your primary Cardiologist (physician) and Advanced Practice Providers (APPs -  Physician Assistants and Nurse Practitioners) who all work together to provide you with the care you need, when you need it.  Your next appointment:  02/12/2020 AT 8:15 AM WITH DR [facility]    Other Instructions     [facility] CARDIOVASCULAR DIVISION [facility] [address] [place] [place] [address] Dept: [phone] Loc: [phone]  DAUNTE OESTREICH  01/14/2020  You are scheduled for a Peripheral Angiogram on Thursday, March 4 with Dr. Nanetta Batty.  1. Please arrive at the [facility] (Main Entrance A) at [facility]: [address] [place], [place] [address] at 7:30 AM (This time is two hours before your procedure to ensure your preparation). Free valet parking service is available.   Special note: Every effort is made to have your procedure done on time. Please understand that emergencies sometimes delay scheduled procedures.  2. Diet: Do not eat solid foods after midnight.  The patient may have clear liquids until 5am upon the day of the procedure.  3. Labs: You will need to have blood drawn AT [facility] WITHIN 1 WEEK OF CATH   COVID SCREENING ON 01/25/2020 at 3:05 pm   4. Medication instructions in preparation for your procedure:   Contrast Allergy: No  On the morning of your procedure, take your Aspirin, PLAVIX, and any morning medicines NOT listed above.  You may use sips of water.  5. Plan for one night stay--bring personal belongings. 6. Bring a current list of your medications and current insurance cards. 7. You MUST have a responsible person to drive you home. 8. Someone MUST be with you the first 24 hours after you arrive home or your discharge will be delayed.  9. Please wear clothes that are easy to get on and off and wear slip-on shoes.  Thank you for allowing [place] to care for you!   -- [facility]

## 2020-01-14 NOTE — Addendum Note (Signed)
Addended by: Regis Bill B on: 01/14/2020 04:36 PM   Modules accepted: Orders

## 2020-01-14 NOTE — Telephone Encounter (Signed)
Spoke with patient who had virtual visit with Dr Allyson Sabal today. Dr Allyson Sabal wanted patient to have PV angiogram on 3/4. When I called patient to give dates for procedure and Covid screening he said he may need to reschedule. Patient will call back if he needs to postpone

## 2020-01-15 ENCOUNTER — Other Ambulatory Visit: Payer: Self-pay

## 2020-01-15 DIAGNOSIS — I739 Peripheral vascular disease, unspecified: Secondary | ICD-10-CM

## 2020-01-15 MED ORDER — SODIUM CHLORIDE 0.9% FLUSH: 3.0000 mL | Freq: Two times a day (BID) | INTRAVENOUS | Status: DC

## 2020-01-19 ENCOUNTER — Ambulatory Visit: Payer: BC Managed Care – PPO | Admitting: Cardiovascular Disease

## 2020-01-20 ENCOUNTER — Telehealth: Payer: Self-pay | Admitting: Emergency Medicine

## 2020-01-20 NOTE — Telephone Encounter (Signed)
Error

## 2020-01-23 ENCOUNTER — Ambulatory Visit: Payer: BC Managed Care – PPO | Attending: Internal Medicine

## 2020-01-23 DIAGNOSIS — Z23 Encounter for immunization: Secondary | ICD-10-CM

## 2020-01-23 NOTE — Progress Notes (Signed)
   Covid-19 Vaccination Clinic  Name:  Steven Ford    MRN: [id number] DOB: 1960/12/12  01/23/2020  [name] was observed post Covid-19 immunization for 15 minutes without incidence. He was provided with Vaccine Information Sheet and instruction to access the V-Safe system.   [name] was instructed to call 911 with any severe reactions post vaccine: Marland Kitchen Difficulty breathing  . Swelling of your face and throat  . A fast heartbeat  . A bad rash all over your body  . Dizziness and weakness    Immunizations Administered    Name Date Dose VIS Date Route   Pfizer COVID-19 Vaccine 01/23/2020 12:20 PM 0.3 mL 11/06/2019 Intramuscular   Manufacturer: [employer], [employer]   Lot: [id number]   NDC: [address]-8

## 2020-01-25 ENCOUNTER — Telehealth: Payer: Self-pay | Admitting: Cardiovascular Disease

## 2020-01-25 ENCOUNTER — Inpatient Hospital Stay (HOSPITAL_COMMUNITY): Admission: RE | Admit: 2020-01-25 | Payer: BC Managed Care – PPO | Source: Ambulatory Visit

## 2020-01-25 NOTE — Telephone Encounter (Signed)
New Message:     Pt wants to cancel procedure at the hospital for 01-28-20 and reschedule for a later date please.

## 2020-01-25 NOTE — Telephone Encounter (Signed)
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  I advised patient I would go ahead and cancel and reach out to reschedule him for the times if available.

## 2020-01-26 ENCOUNTER — Ambulatory Visit: Payer: BC Managed Care – PPO | Admitting: Cardiology

## 2020-01-28 ENCOUNTER — Ambulatory Visit (HOSPITAL_COMMUNITY)
Admission: RE | Admit: 2020-01-28 | Payer: BC Managed Care – PPO | Source: Home / Self Care | Admitting: Cardiovascular Disease

## 2020-01-28 ENCOUNTER — Encounter (HOSPITAL_COMMUNITY): Admission: RE | Payer: Self-pay | Source: Home / Self Care

## 2020-01-28 SURGERY — ABDOMINAL AORTOGRAM W/LOWER EXTREMITY
Anesthesia: LOCAL | Laterality: Left

## 2020-02-01 ENCOUNTER — Other Ambulatory Visit: Payer: Self-pay

## 2020-02-01 MED ORDER — METOPROLOL SUCCINATE ER 25 MG PO TB24: 25.0000 mg | ORAL_TABLET | Freq: Every day | ORAL | 0 refills | Status: DC

## 2020-02-04 ENCOUNTER — Ambulatory Visit (HOSPITAL_COMMUNITY)
Admission: RE | Admit: 2020-02-04 | Payer: BC Managed Care – PPO | Source: Ambulatory Visit | Attending: Cardiovascular Disease | Admitting: Cardiovascular Disease

## 2020-02-08 NOTE — Telephone Encounter (Signed)
Patient states he is following up in regards to when procedure may be rescheduled. Please advise.

## 2020-02-08 NOTE — Telephone Encounter (Signed)
Returned call to patient he stated he needs PV cath rescheduled to 3/28 to 3/31 his spring break.Stated he had to cancel previous PV cath 3/11 due to school restarting.Stated he has been waiting on someone to call him back.Advised to keep appointment with [facility] 3/19.He will need to be seen since he is 30 days over his last appointment.PV cath can be rescheduled at appointment.

## 2020-02-12 ENCOUNTER — Ambulatory Visit (INDEPENDENT_AMBULATORY_CARE_PROVIDER_SITE_OTHER): Payer: BC Managed Care – PPO | Admitting: Cardiovascular Disease

## 2020-02-12 ENCOUNTER — Encounter: Payer: Self-pay | Admitting: Cardiovascular Disease

## 2020-02-12 ENCOUNTER — Other Ambulatory Visit: Payer: Self-pay

## 2020-02-12 VITALS — BP 134/88 | HR 64 | Ht 70.5 in | Wt 206.0 lb

## 2020-02-12 DIAGNOSIS — R931 Abnormal findings on diagnostic imaging of heart and coronary circulation: Secondary | ICD-10-CM

## 2020-02-12 DIAGNOSIS — I739 Peripheral vascular disease, unspecified: Secondary | ICD-10-CM

## 2020-02-12 DIAGNOSIS — I429 Cardiomyopathy, unspecified: Secondary | ICD-10-CM

## 2020-02-12 HISTORY — DX: Cardiomyopathy, unspecified: I42.9

## 2020-02-12 HISTORY — DX: Abnormal findings on diagnostic imaging of heart and coronary circulation: R93.1

## 2020-02-12 MED ORDER — SODIUM CHLORIDE 0.9% FLUSH: 3.0000 mL | Freq: Two times a day (BID) | INTRAVENOUS | Status: DC

## 2020-02-12 NOTE — Assessment & Plan Note (Signed)
Unclear etiology. Can be due to HTN. Asymptomatic. No evidence of hypervolemia on exam. He is on BB and ACE in.

## 2020-02-12 NOTE — Assessment & Plan Note (Addendum)
Patient presented for evaluation for intervention on right SFA stenosis that found during pre knee surgery cardiac evaluation and clearance. He is asympomatic and denies any claudication.  Lower extremity doppler 12/24/2019 showed:  ABI: R: 0.78. Mid SFA velocity elevated at 423. Left ABI: 0.92.   -Scheduled for rt SFA intervention 01/21/2020 -Planned to hold rt knee surgery until achieving reperfusion  -Continue ASA and cholesterol lowering agents

## 2020-02-12 NOTE — Patient Instructions (Signed)
    [facility] CARDIOVASCULAR DIVISION [facility] [address] [place] [address] [place] [address] Dept: [phone] Loc: [phone]  ZEUS MARQUIS  02/12/2020  You are scheduled for a Peripheral Angiogram on Thursday, March 25 with Dr. Nanetta Batty.  1. Please arrive at the [facility] (Main Entrance A) at [facility]: [address] [place], [place] [address] at 7:30 AM (This time is two hours before your procedure to ensure your preparation). Free valet parking service is available.   Special note: Every effort is made to have your procedure done on time. Please understand that emergencies sometimes delay scheduled procedures.  2. Diet: Do not eat solid foods after midnight.  The patient may have clear liquids until 5am upon the day of the procedure.  3. Labs: You will need to have blood drawn on today  GO TO [address] Monday 02-15-20 @ 2:15 PM FOR COVID TESTING  4. Medication instructions in preparation for your procedure:  On the morning of your procedure, take your ASPIRIN AND PLAVIX and any morning medicines NOT listed above.  You may use sips of water.  5. Plan for one night stay--bring personal belongings. 6. Bring a current list of your medications and current insurance cards. 7. You MUST have a responsible person to drive you home. 8. Someone MUST be with you the first 24 hours after you arrive home or your discharge will be delayed. 9. Please wear clothes that are easy to get on and off and wear slip-on shoes.  Thank you for allowing [place] to care for you!   -- [place] [facility]   Your physician has requested that you have a lower extremity arterial duplex. During this test, ultrasound are used to evaluate arterial blood flow in the legs. Allow one hour for this exam. There are no restrictions or special instructions. 1 WEEK POST PROCEDURE  Your physician recommends that you  schedule a follow-up appointment in: 2 WEEK POST PROCEDURE

## 2020-02-12 NOTE — Progress Notes (Signed)
02/12/2020 Bevan Disney [place]all   11/16/1961  [id number]  Primary Physician Patient, No Pcp Per Primary Cardiologist: Runell Gess MD Roseanne Reno  HPI:  Steven Ford is a 59 y.o. male, married, father of 2 boys, who referred to our cardiology clinic due to abnormal ABI. He has Hx orf premature CV disease: CAD s/o CABG 2002 and L. SFA intervention/stent placement 2019. He was supposed to have a rt knee surgery, and was seen by his cardiologist for pre-op eval.  Lower extremities US showed decreased right ABI and he was referred to Dr. Allyson Sabal for evaluation for intervenention. 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 He denies any claudication. No C.P, no DOE, no palpitation. Of note, recent echo 12/21/2019 shower decreased EF of 40%, grade 2DD,  with global hypokinesia. Stress Myoview did not show evidence of ischemia.   Current Meds  Medication Sig  . Alirocumab (PRALUENT) 150 MG/ML SOAJ Inject 150 mg into the skin every 14 (fourteen) days.  Marland Kitchen aspirin EC 81 MG tablet Take 81 mg by mouth daily.  . clopidogrel (PLAVIX) 75 MG tablet Take 1 tablet (75 mg total) by mouth daily with breakfast.  . lisinopril (ZESTRIL) 10 MG tablet Take 1 tablet (10 mg total) by mouth daily.  . meloxicam (MOBIC) 15 MG tablet Take 1 tablet by mouth daily as needed for pain.   . metoprolol succinate (TOPROL-XL) 25 MG 24 hr tablet Take 1 tablet (25 mg total) by mouth daily for 15 days.  . Multiple Vitamins-Minerals (MULTIVITAMIN WITH MINERALS) tablet Take 1 tablet by mouth daily.  . tadalafil (CIALIS) 20 MG tablet Take 1 tablet (20 mg total) by mouth daily as needed for erectile dysfunction.   Current Facility-Administered Medications for the 02/12/20 encounter (Office Visit) with Runell Gess, MD  Medication  . sodium chloride flush (NS) 0.9 % injection 3 mL     Allergies  Allergen Reactions  . Influenza Vaccines Other (See Comments)    Pt gets  sick or sicker with flu shot     Social History   Socioeconomic History  . Marital status: Married    Spouse name: Not on file  . Number of children: Not on file  . Years of education: Not on file  . Highest education level: Not on file  Occupational History  . Not on file  Tobacco Use  . Smoking status: Light Tobacco Smoker    Years: 5.00    Types: Cigars  . Smokeless tobacco: Never Used  . Tobacco comment: 09/04/2018 "q time I play golf"  Substance and Sexual Activity  . Alcohol use: Yes    Alcohol/week: 2.0 standard drinks    Types: 2 Cans of beer per week  . Drug use: No  . Sexual activity: Yes  Other Topics Concern  . Not on file  Social History Narrative  . Not on file   Social Determinants of Health   Financial Resource Strain:   . Difficulty of Paying Living Expenses:   Food Insecurity:   . Worried About Programme researcher, broadcasting/film/video in the Last Year:   . Barista in the Last Year:   Transportation Needs:   . Freight forwarder (Medical):   Marland Kitchen Lack of Transportation (Non-Medical):   Physical Activity:   . Days of Exercise per Week:   . Minutes of Exercise per Session:   Stress:   . Feeling of  Stress :   Social Connections:   . Frequency of Communication with Friends and Family:   . Frequency of Social Gatherings with Friends and Family:   . Attends Religious Services:   . Active Member of Clubs or Organizations:   . Attends Archivist Meetings:   Marland Kitchen Marital Status:   Intimate Partner Violence:   . Fear of Current or Ex-Partner:   . Emotionally Abused:   Marland Kitchen Physically Abused:   . Sexually Abused:      Review of Systems: General: negative for chills, fever, night sweats or weight changes.  Cardiovascular: negative for chest pain, dyspnea on exertion, edema, orthopnea, palpitations, paroxysmal nocturnal dyspnea or shortness of breath Dermatological: negative for rash Respiratory: negative for cough or wheezing Urologic: negative for  hematuria Abdominal: negative for nausea, vomiting, diarrhea, bright red blood per rectum, melena, or hematemesis Neurologic: negative for visual changes, syncope, or dizziness All other systems reviewed and are otherwise negative except as noted above.    Blood pressure 134/88, pulse 64, height 5' 10.5" (1.791 m), weight 206 lb (93.4 kg).  Physical Exam  Constitutional:Well-developed and well-nourished. No acute distress.  HENT: Wearng mask Cardiovascular: bradycardic, regular rhythm, nl S1S2, no murmur,  no LEE Respiratory: Effort normal and breath sounds normal. No respiratory distress. No wheezes. Diminished dorsalis pedis pulse bilateraly L>R GI: Soft. Bowel sounds are normal. No distension. There is no tenderness.  Neurological: Is alert and oriented x 3  Skin: Not diaphoretic. No erythema.  Psychiatric:  Normal mood and affect. Behavior is normal. Judgment and thought content normal.   EKG: NSR, sinus bradycardia with HR 50   ASSESSMENT AND PLAN:   Claudication in peripheral vascular disease [facility]) Patient presented for evaluation for intervention on right SFA stenosis that found during pre knee surgery cardiac evaluation and clearance. He is asympomatic and denies any claudication.  Lower extremity doppler 12/24/2019 showed:  ABI: R: 0.78. Mid SFA velocity elevated at 423. Left ABI: 0.92.   -Scheduled for rt SFA intervention 01/21/2020 -Planned to hold rt knee surgery until achieving reperfusion  -Continue ASA and cholesterol lowering agents   Abnormal echocardiogram Unclear etiology. Can be due to HTN. Asymptomatic. No evidence of hypervolemia on exam. He is on BB and ACE in.      Linna Hoff MD IM PGY-2 02/12/2020 9:20 AM  Dr. Myrtie Hawk  Mr. Donaho had left SFA orbital atherectomy, PTA and stenting using a superior self-expanding stent for high-grade segmental calcified left SFA stenosis and left calf claudication.  Procedure was successful.  Claudication  resolved and Dopplers normalized.  He does have similar anatomy on the right and needs right total knee replacement by Dr. Noemi Chapel.  He will need his right SFA intervened on to allow healing of his wound.  Lorretta Harp, M.D., [place], [facility], [place] [address] [place], [place]  [address]  [phone] 02/12/2020 11:08 AM

## 2020-02-12 NOTE — H&P (View-Only) (Signed)
02/12/2020 Steven Ford Ford   11/16/1961  [id number]  Primary Physician Patient, No Pcp Per Primary Cardiologist: Steven Ford Gess MD Steven Ford Ford  HPI:  Steven Ford Ford is a 59 y.o. male, married, father of 2 boys, who referred to our cardiology clinic due to abnormal ABI. He has Hx orf premature CV disease: CAD s/o CABG 2002 and L. SFA intervention/stent placement 2019. He was supposed to have a rt knee surgery, and was seen by his cardiologist for pre-op eval.  Lower extremities US showed decreased right ABI and he was referred to Dr. Allyson Ford for evaluation for intervenention. 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 He denies any claudication. No C.P, no DOE, no palpitation. Of note, recent echo 12/21/2019 shower decreased EF of 40%, grade 2DD,  with global hypokinesia. Stress Myoview did not show evidence of ischemia.   Current Meds  Medication Sig  . Alirocumab (PRALUENT) 150 MG/ML SOAJ Inject 150 mg into the skin every 14 (fourteen) days.  Marland Kitchen aspirin EC 81 MG tablet Take 81 mg by mouth daily.  . clopidogrel (PLAVIX) 75 MG tablet Take 1 tablet (75 mg total) by mouth daily with breakfast.  . lisinopril (ZESTRIL) 10 MG tablet Take 1 tablet (10 mg total) by mouth daily.  . meloxicam (MOBIC) 15 MG tablet Take 1 tablet by mouth daily as needed for pain.   . metoprolol succinate (TOPROL-XL) 25 MG 24 hr tablet Take 1 tablet (25 mg total) by mouth daily for 15 days.  . Multiple Vitamins-Minerals (MULTIVITAMIN WITH MINERALS) tablet Take 1 tablet by mouth daily.  . tadalafil (CIALIS) 20 MG tablet Take 1 tablet (20 mg total) by mouth daily as needed for erectile dysfunction.   Current Facility-Administered Medications for the 02/12/20 encounter (Office Visit) with Steven Ford Gess, MD  Medication  . sodium chloride flush (NS) 0.9 % injection 3 mL     Allergies  Allergen Reactions  . Influenza Vaccines Other (See Comments)    Pt gets  sick or sicker with flu shot     Social History   Socioeconomic History  . Marital status: Married    Spouse name: Not on file  . Number of children: Not on file  . Years of education: Not on file  . Highest education level: Not on file  Occupational History  . Not on file  Tobacco Use  . Smoking status: Light Tobacco Smoker    Years: 5.00    Types: Cigars  . Smokeless tobacco: Never Used  . Tobacco comment: 09/04/2018 "q time I play golf"  Substance and Sexual Activity  . Alcohol use: Yes    Alcohol/week: 2.0 standard drinks    Types: 2 Cans of beer per week  . Drug use: No  . Sexual activity: Yes  Other Topics Concern  . Not on file  Social History Narrative  . Not on file   Social Determinants of Health   Financial Resource Strain:   . Difficulty of Paying Living Expenses:   Food Insecurity:   . Worried About Programme researcher, broadcasting/film/video in the Last Year:   . Barista in the Last Year:   Transportation Needs:   . Freight forwarder (Medical):   Marland Kitchen Lack of Transportation (Non-Medical):   Physical Activity:   . Days of Exercise per Week:   . Minutes of Exercise per Session:   Stress:   . Feeling of  Stress :   Social Connections:   . Frequency of Communication with Friends and Family:   . Frequency of Social Gatherings with Friends and Family:   . Attends Religious Services:   . Active Member of Clubs or Organizations:   . Attends Club or Organization Meetings:   . Marital Status:   Intimate Partner Violence:   . Fear of Current or Ex-Partner:   . Emotionally Abused:   . Physically Abused:   . Sexually Abused:      Review of Systems: General: negative for chills, fever, night sweats or weight changes.  Cardiovascular: negative for chest pain, dyspnea on exertion, edema, orthopnea, palpitations, paroxysmal nocturnal dyspnea or shortness of breath Dermatological: negative for rash Respiratory: negative for cough or wheezing Urologic: negative for  hematuria Abdominal: negative for nausea, vomiting, diarrhea, bright red blood per rectum, melena, or hematemesis Neurologic: negative for visual changes, syncope, or dizziness All other systems reviewed and are otherwise negative except as noted above.    Blood pressure 134/88, pulse 64, height 5' 10.5" (1.791 m), weight 206 lb (93.4 kg).  Physical Exam  Constitutional:Well-developed and well-nourished. No acute distress.  HENT: Wearng mask Cardiovascular: bradycardic, regular rhythm, nl S1S2, no murmur,  no LEE Respiratory: Effort normal and breath sounds normal. No respiratory distress. No wheezes. Diminished dorsalis pedis pulse bilateraly L>R GI: Soft. Bowel sounds are normal. No distension. There is no tenderness.  Neurological: Is alert and oriented x 3  Skin: Not diaphoretic. No erythema.  Psychiatric:  Normal mood and affect. Behavior is normal. Judgment and thought content normal.   EKG: NSR, sinus bradycardia with HR 50   ASSESSMENT AND PLAN:   Claudication in peripheral vascular disease (HCC) Patient presented for evaluation for intervention on right SFA stenosis that found during pre knee surgery cardiac evaluation and clearance. He is asympomatic and denies any claudication.  Lower extremity doppler 12/24/2019 showed:  ABI: R: 0.78. Mid SFA velocity elevated at 423. Left ABI: 0.92.   -Scheduled for rt SFA intervention 01/21/2020 -Planned to hold rt knee surgery until achieving reperfusion  -Continue ASA and cholesterol lowering agents   Abnormal echocardiogram Unclear etiology. Can be due to HTN. Asymptomatic. No evidence of hypervolemia on exam. He is on BB and ACE in.      Steven Almalik Weissberg MD IM PGY-2 02/12/2020 9:20 AM  Dr. Jashan Ford  Steven Ford Ford had left SFA orbital atherectomy, PTA and stenting using a superior self-expanding stent for high-grade segmental calcified left SFA stenosis and left calf claudication.  Procedure was successful.  Claudication  resolved and Dopplers normalized.  He does have similar anatomy on the right and needs right total knee replacement by Dr. Wainer.  He will need his right SFA intervened on to allow healing of his wound.  Jonathan J. Berry, M.D., FACP, FACC, FAHA, FSCAI [facility] [address] [place], [place]  [address]  [phone] 02/12/2020 11:08 AM  

## 2020-02-13 ENCOUNTER — Ambulatory Visit: Payer: BC Managed Care – PPO | Attending: Internal Medicine

## 2020-02-13 DIAGNOSIS — Z23 Encounter for immunization: Secondary | ICD-10-CM

## 2020-02-13 NOTE — Progress Notes (Signed)
   Covid-19 Vaccination Clinic  Name:  Steven Ford    MRN: [id number] DOB: Apr 20, 1961  02/13/2020  Mr. Steven Ford was observed post Covid-19 immunization for 15 minutes without incident. He was provided with Vaccine Information Sheet and instruction to access the V-Safe system.   Mr. Steven Ford was instructed to call 911 with any severe reactions post vaccine: Marland Kitchen Difficulty breathing  . Swelling of face and throat  . A fast heartbeat  . A bad rash all over body  . Dizziness and weakness   Immunizations Administered    Name Date Dose VIS Date Route   Pfizer COVID-19 Vaccine 02/13/2020 11:17 AM 0.3 mL 11/06/2019 Intramuscular   Manufacturer: [employer], [employer]   Lot: [id number]   NDC: [address]-1

## 2020-02-15 ENCOUNTER — Other Ambulatory Visit (HOSPITAL_COMMUNITY)
Admission: RE | Admit: 2020-02-15 | Discharge: 2020-02-15 | Disposition: A | Discharge status: Home / Self Care | Payer: BC Managed Care – PPO | Source: Ambulatory Visit | Attending: Cardiovascular Disease | Admitting: Cardiovascular Disease

## 2020-02-15 DIAGNOSIS — Z01812 Encounter for preprocedural laboratory examination: Secondary | ICD-10-CM | POA: Diagnosis present

## 2020-02-15 DIAGNOSIS — Z20822 Contact with and (suspected) exposure to covid-19: Secondary | ICD-10-CM | POA: Insufficient documentation

## 2020-02-15 LAB — CBC
Hematocrit: 46.7 % (ref 37.5–51.0)
Hemoglobin: 15.5 g/dL (ref 13.0–17.7)
MCH: 31.1 pg (ref 26.6–33.0)
MCHC: 33.2 g/dL (ref 31.5–35.7)
MCV: 94 fL (ref 79–97)
Platelets: 259 10*3/uL (ref 150–450)
RBC: 4.99 x10E6/uL (ref 4.14–5.80)
RDW: 13.1 % (ref 11.6–15.4)
WBC: 5.2 10*3/uL (ref 3.4–10.8)

## 2020-02-15 LAB — BASIC METABOLIC PANEL
BUN/Creatinine Ratio: 15 (ref 9–20)
BUN: 14 mg/dL (ref 6–24)
CO2: 23 mmol/L (ref 20–29)
Calcium: 8.9 mg/dL (ref 8.7–10.2)
Chloride: 104 mmol/L (ref 96–106)
Creatinine, Ser: 0.92 mg/dL (ref 0.76–1.27)
GFR calc Af Amer: 105 mL/min/{1.73_m2} (ref >59)
GFR calc non Af Amer: 91 mL/min/{1.73_m2} (ref >59)
Glucose: 114 mg/dL — ABNORMAL HIGH (ref 65–99)
Potassium: 4.2 mmol/L (ref 3.5–5.2)
Sodium: 140 mmol/L (ref 134–144)

## 2020-02-16 LAB — SARS CORONAVIRUS 2 (TAT 6-24 HRS): SARS Coronavirus 2: NEGATIVE

## 2020-02-17 ENCOUNTER — Telehealth: Payer: Self-pay | Admitting: *Deleted

## 2020-02-17 NOTE — Telephone Encounter (Signed)
Pt contacted pre-abdominal aortogram scheduled at [facility] for: Thursday February 18, 2020 9:30AM Verified arrival time and place: [facility] Main Entrance A [facility]) at: 7:30 AM   No solid food after midnight prior to cath, clear liquids until 5 AM day of procedure. Contrast allergy: no  Hold: Cialis-until post procedure  AM meds can be  taken pre-cath with sip of water including: ASA 81 mg   Confirmed patient has responsible adult to drive home post procedure and observe 24 hours after arriving home: yes  Currently, due to Covid-19 pandemic, only one person will be allowed with patient. Must be the same person for patient's entire stay and will be required to wear a mask. They will be asked to wait in the waiting room for the duration of the patient's stay.  Patients are required to wear a mask when they enter the hospital.      COVID-19 Pre-Screening Questions:  . In the past 7 to 10 days have you had a cough,  shortness of breath, headache, congestion, fever (100 or greater) body aches, chills, sore throat, or sudden loss of taste or sense of smell? no . Have you been around anyone with known Covid 19 in the past 7-10 days? no . Have you been around anyone who is awaiting Covid 19 test results in the past 7 to 10 days? no . Have you been around anyone who has been exposed to Covid 19, or has mentioned symptoms of Covid 19 within the past 7 to 10 days? no  I reviewed procedure/mask/visitor instructions, COVID-19 screening questions with patient, he verbalized understanding, thanked me for call.

## 2020-02-18 ENCOUNTER — Encounter (HOSPITAL_COMMUNITY): Payer: Self-pay | Admitting: Cardiovascular Disease

## 2020-02-18 ENCOUNTER — Other Ambulatory Visit: Payer: Self-pay

## 2020-02-18 ENCOUNTER — Ambulatory Visit (HOSPITAL_COMMUNITY)
Admission: RE | Admit: 2020-02-18 | Discharge: 2020-02-19 | Disposition: A | Discharge status: Home / Self Care | Payer: BC Managed Care – PPO | Attending: Cardiovascular Disease | Admitting: Cardiovascular Disease

## 2020-02-18 ENCOUNTER — Encounter (HOSPITAL_COMMUNITY)
Admission: RE | Disposition: A | Discharge status: Home / Self Care | Payer: Self-pay | Source: Home / Self Care | Attending: Cardiovascular Disease

## 2020-02-18 DIAGNOSIS — I70213 Atherosclerosis of native arteries of extremities with intermittent claudication, bilateral legs: Secondary | ICD-10-CM | POA: Insufficient documentation

## 2020-02-18 DIAGNOSIS — I2510 Atherosclerotic heart disease of native coronary artery without angina pectoris: Secondary | ICD-10-CM | POA: Diagnosis not present

## 2020-02-18 DIAGNOSIS — Z7982 Long term (current) use of aspirin: Secondary | ICD-10-CM | POA: Insufficient documentation

## 2020-02-18 DIAGNOSIS — I10 Essential (primary) hypertension: Secondary | ICD-10-CM | POA: Diagnosis not present

## 2020-02-18 DIAGNOSIS — I70211 Atherosclerosis of native arteries of extremities with intermittent claudication, right leg: Secondary | ICD-10-CM | POA: Diagnosis not present

## 2020-02-18 DIAGNOSIS — Z79899 Other long term (current) drug therapy: Secondary | ICD-10-CM | POA: Insufficient documentation

## 2020-02-18 DIAGNOSIS — Z791 Long term (current) use of non-steroidal anti-inflammatories (NSAID): Secondary | ICD-10-CM | POA: Diagnosis not present

## 2020-02-18 DIAGNOSIS — Z951 Presence of aortocoronary bypass graft: Secondary | ICD-10-CM | POA: Diagnosis not present

## 2020-02-18 DIAGNOSIS — F17210 Nicotine dependence, cigarettes, uncomplicated: Secondary | ICD-10-CM | POA: Insufficient documentation

## 2020-02-18 DIAGNOSIS — I739 Peripheral vascular disease, unspecified: Secondary | ICD-10-CM | POA: Diagnosis present

## 2020-02-18 HISTORY — PX: PERIPHERAL VASCULAR ATHERECTOMY: CATH118256

## 2020-02-18 HISTORY — PX: ABDOMINAL AORTOGRAM W/LOWER EXTREMITY: CATH118223

## 2020-02-18 HISTORY — PX: PERIPHERAL VASCULAR INTERVENTION: CATH118257

## 2020-02-18 LAB — URINALYSIS, ROUTINE W REFLEX MICROSCOPIC
Bilirubin Urine: NEGATIVE
Glucose, UA: NEGATIVE mg/dL
Ketones, ur: 5 mg/dL — AB
Leukocytes,Ua: NEGATIVE
Nitrite: NEGATIVE
Protein, ur: 100 mg/dL — AB
Specific Gravity, Urine: 1.045 — ABNORMAL HIGH (ref 1.005–1.030)
pH: 6 (ref 5.0–8.0)

## 2020-02-18 LAB — POCT ACTIVATED CLOTTING TIME
Activated Clotting Time: 246 seconds
Activated Clotting Time: 285 seconds
Activated Clotting Time: 257 seconds
Activated Clotting Time: 263 seconds
Activated Clotting Time: 268 seconds

## 2020-02-18 SURGERY — ABDOMINAL AORTOGRAM W/LOWER EXTREMITY
Anesthesia: LOCAL | Laterality: Right

## 2020-02-18 MED ORDER — SODIUM CHLORIDE 0.9% FLUSH: 3.0000 mL | INTRAVENOUS | Status: DC | PRN

## 2020-02-18 MED ORDER — MIDAZOLAM HCL 2 MG/2ML IJ SOLN
INTRAMUSCULAR | Status: AC
  Filled 2020-02-18: qty 2

## 2020-02-18 MED ORDER — HEPARIN SODIUM (PORCINE) 1000 UNIT/ML IJ SOLN
INTRAMUSCULAR | Status: AC
  Filled 2020-02-18: qty 1

## 2020-02-18 MED ORDER — VERAPAMIL HCL 2.5 MG/ML IV SOLN
INTRAVENOUS | Status: AC
  Filled 2020-02-18: qty 2

## 2020-02-18 MED ORDER — ASPIRIN 81 MG PO CHEW: 81.0000 mg | CHEWABLE_TABLET | ORAL | Status: DC

## 2020-02-18 MED ORDER — HEPARIN (PORCINE) IN NACL 1000-0.9 UT/500ML-% IV SOLN
INTRAVENOUS | Status: AC
  Filled 2020-02-18: qty 1000

## 2020-02-18 MED ORDER — NITROGLYCERIN 1 MG/10 ML FOR IR/CATH LAB
INTRA_ARTERIAL | Status: DC | PRN
  Administered 2020-02-18 (×6): 200 ug via INTRA_ARTERIAL

## 2020-02-18 MED ORDER — HEPARIN (PORCINE) IN NACL 1000-0.9 UT/500ML-% IV SOLN
INTRAVENOUS | Status: DC | PRN
  Administered 2020-02-18: 500 mL

## 2020-02-18 MED ORDER — METOPROLOL SUCCINATE ER 25 MG PO TB24
25.0000 mg | ORAL_TABLET | Freq: Every day | ORAL | Status: DC
  Administered 2020-02-18 – 2020-02-19 (×2): 25 mg via ORAL
  Filled 2020-02-18 (×2): qty 1

## 2020-02-18 MED ORDER — ACETAMINOPHEN 325 MG PO TABS: 650.0000 mg | ORAL_TABLET | ORAL | Status: DC | PRN

## 2020-02-18 MED ORDER — HYDRALAZINE HCL 20 MG/ML IJ SOLN
INTRAMUSCULAR | Status: AC
  Filled 2020-02-18: qty 1

## 2020-02-18 MED ORDER — ATORVASTATIN CALCIUM 40 MG PO TABS
40.0000 mg | ORAL_TABLET | Freq: Every day | ORAL | Status: DC
  Administered 2020-02-18: 19:00:00 40 mg via ORAL
  Filled 2020-02-18: qty 1

## 2020-02-18 MED ORDER — ONDANSETRON HCL 4 MG/2ML IJ SOLN
4.0000 mg | Freq: Four times a day (QID) | INTRAMUSCULAR | Status: DC | PRN
  Administered 2020-02-18: 14:00:00 4 mg via INTRAVENOUS

## 2020-02-18 MED ORDER — SODIUM CHLORIDE 0.9 % IV SOLN: INTRAVENOUS | Status: AC

## 2020-02-18 MED ORDER — MORPHINE SULFATE (PF) 2 MG/ML IV SOLN
INTRAVENOUS | Status: AC
  Filled 2020-02-18: qty 1

## 2020-02-18 MED ORDER — NITROGLYCERIN IN D5W 200-5 MCG/ML-% IV SOLN
INTRAVENOUS | Status: AC
  Filled 2020-02-18: qty 250

## 2020-02-18 MED ORDER — FENTANYL CITRATE (PF) 100 MCG/2ML IJ SOLN
INTRAMUSCULAR | Status: AC
  Filled 2020-02-18: qty 2

## 2020-02-18 MED ORDER — ANGIOPLASTY BOOK
Freq: Once | Status: AC
  Filled 2020-02-18: qty 1

## 2020-02-18 MED ORDER — CLOPIDOGREL BISULFATE 75 MG PO TABS
75.0000 mg | ORAL_TABLET | Freq: Every day | ORAL | Status: DC
  Administered 2020-02-19: 75 mg via ORAL
  Filled 2020-02-18: qty 1

## 2020-02-18 MED ORDER — ONDANSETRON HCL 4 MG/2ML IJ SOLN
INTRAMUSCULAR | Status: AC
  Filled 2020-02-18: qty 2

## 2020-02-18 MED ORDER — MORPHINE SULFATE (PF) 2 MG/ML IV SOLN
2.0000 mg | INTRAVENOUS | Status: DC | PRN
  Administered 2020-02-18 (×2): 2 mg via INTRAVENOUS

## 2020-02-18 MED ORDER — ASPIRIN EC 81 MG PO TBEC: 81.0000 mg | DELAYED_RELEASE_TABLET | Freq: Every day | ORAL | Status: DC

## 2020-02-18 MED ORDER — CLOPIDOGREL BISULFATE 300 MG PO TABS
ORAL_TABLET | ORAL | Status: AC
  Filled 2020-02-18: qty 1

## 2020-02-18 MED ORDER — HYDRALAZINE HCL 20 MG/ML IJ SOLN
5.0000 mg | INTRAMUSCULAR | Status: DC | PRN
  Administered 2020-02-18: 14:00:00 5 mg via INTRAVENOUS

## 2020-02-18 MED ORDER — ASPIRIN EC 81 MG PO TBEC
81.0000 mg | DELAYED_RELEASE_TABLET | Freq: Every day | ORAL | Status: DC
  Administered 2020-02-18 – 2020-02-19 (×2): 81 mg via ORAL
  Filled 2020-02-18 (×2): qty 1

## 2020-02-18 MED ORDER — CLOPIDOGREL BISULFATE 300 MG PO TABS
ORAL_TABLET | ORAL | Status: DC | PRN
  Administered 2020-02-18: 300 mg via ORAL

## 2020-02-18 MED ORDER — SODIUM CHLORIDE 0.9 % WEIGHT BASED INFUSION
3.0000 mL/kg/h | INTRAVENOUS | Status: DC
  Administered 2020-02-18: 08:00:00 3 mL/kg/h via INTRAVENOUS

## 2020-02-18 MED ORDER — HYDRALAZINE HCL 20 MG/ML IJ SOLN
INTRAMUSCULAR | Status: DC | PRN
  Administered 2020-02-18: 10 mg via INTRAVENOUS

## 2020-02-18 MED ORDER — LABETALOL HCL 5 MG/ML IV SOLN: 10.0000 mg | INTRAVENOUS | Status: DC | PRN

## 2020-02-18 MED ORDER — VIPERSLIDE LUBRICANT OPTIME: TOPICAL | Status: DC | PRN

## 2020-02-18 MED ORDER — SODIUM CHLORIDE 0.9 % IV SOLN: 250.0000 mL | INTRAVENOUS | Status: DC | PRN

## 2020-02-18 MED ORDER — LIDOCAINE HCL (PF) 1 % IJ SOLN
INTRAMUSCULAR | Status: AC
  Filled 2020-02-18: qty 30

## 2020-02-18 MED ORDER — SODIUM CHLORIDE 0.9% FLUSH: 3.0000 mL | Freq: Two times a day (BID) | INTRAVENOUS | Status: DC

## 2020-02-18 MED ORDER — IODIXANOL 320 MG/ML IV SOLN
INTRAVENOUS | Status: DC | PRN
  Administered 2020-02-18: 170 mL via INTRA_ARTERIAL

## 2020-02-18 MED ORDER — MIDAZOLAM HCL 2 MG/2ML IJ SOLN
INTRAMUSCULAR | Status: DC | PRN
  Administered 2020-02-18: 1 mg via INTRAVENOUS
  Administered 2020-02-18: 2 mg via INTRAVENOUS
  Administered 2020-02-18 (×3): 1 mg via INTRAVENOUS

## 2020-02-18 MED ORDER — LISINOPRIL 10 MG PO TABS
10.0000 mg | ORAL_TABLET | Freq: Every day | ORAL | Status: DC
  Administered 2020-02-18 – 2020-02-19 (×2): 10 mg via ORAL
  Filled 2020-02-18 (×2): qty 1

## 2020-02-18 MED ORDER — ZOLPIDEM TARTRATE 5 MG PO TABS
5.0000 mg | ORAL_TABLET | Freq: Every evening | ORAL | Status: DC | PRN
  Administered 2020-02-18: 22:00:00 5 mg via ORAL
  Filled 2020-02-18: qty 1

## 2020-02-18 MED ORDER — FENTANYL CITRATE (PF) 100 MCG/2ML IJ SOLN
INTRAMUSCULAR | Status: DC | PRN
  Administered 2020-02-18 (×3): 25 ug via INTRAVENOUS
  Administered 2020-02-18: 50 ug via INTRAVENOUS
  Administered 2020-02-18 (×2): 25 ug via INTRAVENOUS

## 2020-02-18 MED ORDER — SODIUM CHLORIDE 0.9 % WEIGHT BASED INFUSION: 1.0000 mL/kg/h | INTRAVENOUS | Status: DC

## 2020-02-18 MED ORDER — HEPARIN SODIUM (PORCINE) 1000 UNIT/ML IJ SOLN
INTRAMUSCULAR | Status: DC | PRN
  Administered 2020-02-18: 3000 [IU] via INTRAVENOUS
  Administered 2020-02-18: 2000 [IU] via INTRAVENOUS
  Administered 2020-02-18: 3000 [IU] via INTRAVENOUS
  Administered 2020-02-18: 10000 [IU] via INTRAVENOUS
  Administered 2020-02-18: 3000 [IU] via INTRAVENOUS
  Administered 2020-02-18: 2500 [IU] via INTRAVENOUS

## 2020-02-18 MED ORDER — LIDOCAINE HCL (PF) 1 % IJ SOLN
INTRAMUSCULAR | Status: DC | PRN
  Administered 2020-02-18: 25 mL

## 2020-02-18 SURGICAL SUPPLY — 38 items
BALLN COYOTE OTW 4X120X150 (BALLOONS) ×3
BALLN STERLING OTW 5X150X150 (BALLOONS) ×3
BALLN STERLING OTW 6X150X150 (BALLOONS) ×3
BALLOON COYOTE OTW 4X120X150 (BALLOONS) IMPLANT
BALLOON STERLING OTW 5X150X150 (BALLOONS) IMPLANT
BALLOON STERLING OTW 6X150X150 (BALLOONS) IMPLANT
BUR DBK EXCH SYS 1.25 MIC 145 (BURR) IMPLANT
BURR DBK EXCH SYS 1.25 MIC 145 (BURR) ×3
CATH ANGIO 5F PIGTAIL 65CM (CATHETERS) ×1 IMPLANT
CATH CROSS OVER TEMPO 5F (CATHETERS) ×1 IMPLANT
CATH CXI SUPP 2.6F 150 ST (CATHETERS) ×1 IMPLANT
CATH STRAIGHT 5FR 65CM (CATHETERS) ×1 IMPLANT
CATH TELEPORT (CATHETERS) ×1 IMPLANT
CLOSURE MYNX CONTROL 6F/7F (Vascular Products) ×1 IMPLANT
CROWN 2.0 SOLID 145 DIAMONDBK (BURR) ×1 IMPLANT
DEVICE EMBOSHIELD NAV6 4.0-7.0 (FILTER) ×1 IMPLANT
GUIDEWIRE ANGLED .035X150CM (WIRE) ×1 IMPLANT
GUIDEWIRE REGALIA .014X300CM (WIRE) ×1 IMPLANT
KIT ENCORE 26 ADVANTAGE (KITS) ×1 IMPLANT
KIT PV (KITS) ×3 IMPLANT
LUBRICANT VIPERSLIDE CORONARY (MISCELLANEOUS) ×1 IMPLANT
SHEATH HIGHFLEX ANSEL 7FR 55CM (SHEATH) ×1 IMPLANT
SHEATH PINNACLE 5F 10CM (SHEATH) ×1 IMPLANT
SHEATH PINNACLE 7F 10CM (SHEATH) ×1 IMPLANT
SHEATH PROBE COVER 6X72 (BAG) ×1 IMPLANT
STENT ELUVIA 6X120X130 (Permanent Stent) ×2 IMPLANT
STOPCOCK MORSE 400PSI 3WAY (MISCELLANEOUS) ×1 IMPLANT
SYR MEDRAD MARK 7 150ML (SYRINGE) ×3 IMPLANT
TAPE VIPERTRACK RADIOPAQ (MISCELLANEOUS) IMPLANT
TAPE VIPERTRACK RADIOPAQUE (MISCELLANEOUS) ×2
TRANSDUCER W/STOPCOCK (MISCELLANEOUS) ×3 IMPLANT
TRAY PV CATH (CUSTOM PROCEDURE TRAY) ×3 IMPLANT
TUBING CIL FLEX 10 FLL-RA (TUBING) ×1 IMPLANT
WIRE HITORQ VERSACORE ST 145CM (WIRE) ×1 IMPLANT
WIRE ROSEN-J .035X260CM (WIRE) ×1 IMPLANT
WIRE VIPER ADVANCE .017X335CM (WIRE) ×1 IMPLANT
WIRE VIPER WIRECTO 0.014 (WIRE) ×1 IMPLANT
WIRE ZILIENT 018 4G (WIRE) ×1 IMPLANT

## 2020-02-18 NOTE — Progress Notes (Signed)
BP cuff while patient eats.  Patient has voided and had pain treated.  Will recheck after meal.

## 2020-02-18 NOTE — Interval H&P Note (Signed)
History and Physical Interval Note:  02/18/2020 10:12 AM  Steven Ford  has presented today for surgery, with the diagnosis of PAD.  The various methods of treatment have been discussed with the patient and family. After consideration of risks, benefits and other options for treatment, the patient has consented to  Procedure(s): ABDOMINAL AORTOGRAM W/LOWER EXTREMITY (Right) as a surgical intervention.  The patient's history has been reviewed, patient examined, no change in status, stable for surgery.  I have reviewed the patient's chart and labs.  Questions were answered to the patient's satisfaction.     Nanetta Batty

## 2020-02-18 NOTE — Progress Notes (Signed)
Patient has been treated for pain, hypertension and nausea.  Patient denies any increased pain in the left groin or right leg.  Patient denies chest pain.  Will continue to monitor.

## 2020-02-19 DIAGNOSIS — I70213 Atherosclerosis of native arteries of extremities with intermittent claudication, bilateral legs: Secondary | ICD-10-CM | POA: Diagnosis not present

## 2020-02-19 DIAGNOSIS — I739 Peripheral vascular disease, unspecified: Secondary | ICD-10-CM

## 2020-02-19 LAB — CBC
HCT: 42.2 % (ref 39.0–52.0)
Hemoglobin: 14 g/dL (ref 13.0–17.0)
MCH: 30.5 pg (ref 26.0–34.0)
MCHC: 33.2 g/dL (ref 30.0–36.0)
MCV: 91.9 fL (ref 80.0–100.0)
Platelets: 238 10*3/uL (ref 150–400)
RBC: 4.59 MIL/uL (ref 4.22–5.81)
RDW: 13.4 % (ref 11.5–15.5)
WBC: 7.2 10*3/uL (ref 4.0–10.5)
nRBC: 0 % (ref 0.0–0.2)

## 2020-02-19 LAB — BASIC METABOLIC PANEL
Anion gap: 9 (ref 5–15)
BUN: 12 mg/dL (ref 6–20)
CO2: 24 mmol/L (ref 22–32)
Calcium: 8.3 mg/dL — ABNORMAL LOW (ref 8.9–10.3)
Chloride: 106 mmol/L (ref 98–111)
Creatinine, Ser: 0.95 mg/dL (ref 0.61–1.24)
GFR calc Af Amer: >60 mL/min (ref >60)
GFR calc non Af Amer: >60 mL/min (ref >60)
Glucose, Bld: 101 mg/dL — ABNORMAL HIGH (ref 70–99)
Potassium: 3.5 mmol/L (ref 3.5–5.1)
Sodium: 139 mmol/L (ref 135–145)

## 2020-02-19 MED ORDER — CLOPIDOGREL BISULFATE 75 MG PO TABS: 75.0000 mg | ORAL_TABLET | Freq: Every day | ORAL | 11 refills | Status: DC

## 2020-02-19 MED ORDER — ATORVASTATIN CALCIUM 40 MG PO TABS: 40.0000 mg | ORAL_TABLET | Freq: Every day | ORAL | 3 refills | Status: DC

## 2020-02-19 MED FILL — CLOPIDOGREL 75 MG TABLET: 75 | 30 days supply | Qty: 30 | Fill #0

## 2020-02-19 MED FILL — ATORVASTATIN CALCIUM 40 MG: 40 | 30 days supply | Qty: 30 | Fill #0

## 2020-02-19 NOTE — Discharge Summary (Addendum)
Discharge Summary    Patient ID: Steven Ford MRN: [id number]; DOB: [birth year]  Admit date: 02/18/2020 Discharge date: 02/19/2020  Primary Care Provider: Patient, No Pcp Per  Primary Cardiologist: Gypsy Balsam, MD   Discharge Diagnoses    Active Problems:   Claudication in peripheral vascular disease (HCC)  HTN  HLD  PVD   Diagnostic Studies/Procedures   PV Angiogram/Intervention 02/18/20 Procedures Performed:               1.  Ultrasound-guided left common femoral artery access               2.  Abdominal aortogram/bilateral iliac angiogram/bifemoral runoff               3.  Contralateral access (secondary catheter placement)               4.  Placement of an NAV 6 distal protection device in the right below-the-knee popliteal artery.                          5.  Diamondback orbital atherectomy, PTA and drug-eluting stenting of the entire right SFA               6.  Left common femoral angiogram, placement of Mynx closure device   Angiographic Data:   1: Abdominal aortogram-the distal abdominal aorta was free of significant disease 2: Left lower extremity-the entire stented segment was patent although the proximal half of the left SFA stent had diffuse 80% in-stent restenosis.  There was three-vessel runoff 3: Right lower extremity-the entire proximal, mid and distal right SFA was highly calcified and subtotally occluded.  There is three-vessel runoff  Final Impression: 80% in-stent restenosis within the previously placed SUPERA stent in the left SFA.  Successful right SFA orbital atherectomy, PTA and drug coated stenting using distal protection for high-grade proximal, mid and distal calcified right SFA stenosis using ELUVIA  drug eluding stents.  The patient had a palpable right pedal pulse post procedure.  I did perform a left common femoral angiogram and placed a minx closure device successfully achieving hemostasis.  He received 300 mg of p.o. Plavix.  He left lab  in stable condition.  He will be hydrated overnight, discharged home in the morning.  We will get lower extremity arterial Doppler studies in our [facility] line office next week and I will see him back 2 weeks out thereafter.   History of Present Illness     Steven Ford is a 59 y.o. male with hx of CAD, HTN, HLD and PVD presents for outpatient PV angiogram.   Hx of CAD s/p CABG x in 2002. Asymptomatic since then. S/p diamondbackorbital rotational atherectomy, drug-coated balloon angioplasty and Superastenting of his entire mid to distal left SFA with an excellent angiographic and clinical result 09/04/2018.  His recent lower extremity arterial Doppler studies performed 12/24/2019 revealed a left ABI 0.93 and a right ABI 0.78 with a high-frequency signal in his mid right SFA. He apparently needs a right total knee replacement to be done by Dr. Thurston Hole. There were concern for would healing. Recommended surgical intervention prior to knee surgery.   Hospital Course     Consultants: None  PV angiogram showed 80% in-stent restenosis within the previously placed SUPERA stent in the left SFA.  S/p successful right SFA orbital atherectomy, PTA and drug coated stenting using distal protection for high-grade proximal, mid and distal calcified right SFA stenosis using  ELUVIA  drug eluding stents. No procedural or overnight complications. Hydrated. Renal function stable. Continue ASA81mg  qd and Plavix 75mg  qd.  12/11/2019: Cholesterol, Total 192; HDL 47; LDL Chol Calc (NIH) 133; Triglycerides 62. Goal of LDL less than 70. Praluent listed on home medications. Reports taking it.  Added Lipitor to 40mg  qd. Consider repeat labs in 8 weeks.   Did the patient have an acute coronary syndrome (MI, NSTEMI, STEMI, etc) this admission?:  No                               Did the patient have a percutaneous coronary intervention (stent / angioplasty)?:  No.   _____________  Discharge Vitals Blood pressure 130/75,  pulse 63, temperature 99.2 F (37.3 C), temperature source Oral, resp. rate 10, height 5' 10.5" (1.791 m), weight 90.7 kg, SpO2 93 %.  Filed Weights   02/18/20 0754  Weight: 90.7 kg   Physical Exam  Constitutional: He is oriented to person, place, and time and well-developed, well-nourished, and in no distress.  Eyes: Pupils are equal, round, and reactive to light. EOM are normal.  Cardiovascular: Normal rate and regular rhythm.  No left groin hematoma.   Pulmonary/Chest: Effort normal and breath sounds normal.  Abdominal: Soft. Bowel sounds are normal.  Musculoskeletal:        General: Normal range of motion.     Cervical back: Normal range of motion and neck supple.  Neurological: He is alert and oriented to person, place, and time.  Skin: Skin is warm and dry.   Labs & Radiologic Studies    CBC Recent Labs    02/19/20 0345  WBC 7.2  HGB 14.0  HCT 42.2  MCV 91.9  PLT 238   Basic Metabolic Panel Recent Labs    40/98/1103/26/21 0345  NA 139  K 3.5  CL 106  CO2 24  GLUCOSE 101*  BUN 12  CREATININE 0.95  CALCIUM 8.3*  _____________  PERIPHERAL VASCULAR CATHETERIZATION  Result Date: 02/18/2020  [id number] LOCATION:  [facility] PH[facility]ICIAN: Nanetta BattyJonathan Berry, M.D. 06/02/1961 DATE OF PROCEDURE:  02/18/2020 DATE OF DISCHARGE: j PV Angiogram/Intervention History obtained from chart review.Steven ShinReginald H Ford is a 59 y.o. married African-American male father for, grandfather of 2 grandchildren referred by Dr. Bing MatterKrasowski for peripheral vascular evaluation because of lifestyle limiting claudication. He works as a history Runner, broadcasting/film/videoteacher at [employer].I last saw him in the office 09/17/2018. His risk factors include treated hypertension hyperlipidemia. Mother did have a myocardial infarction. He had bypass grafting x3 in 2002 and is been asymptomatic from this since. He works out 3-4 times a week doing cardio and bicycle riding and is done this since his bypass surgery. Over  the last year he is noticed increasing left lower extremity claudication which is lifestyle limiting with recent Dopplers that showed a high-frequency signal in his distal left SFA. I performed initial angiography on him 08/28/2018 revealing high-grade calcified segmental bilateral mid to distal SFA stenoses with three-vessel runoff. I brought him back on 09/04/2018 and performed diamondback orbital rotational atherectomy, drug-coated balloon angioplasty and Superastenting of his entire mid to distal left SFA with an excellent angiographic and clinical result. His follow-up Dopplers normalized and his claudication has resolved. He continues to say that he has no symptoms on the right side.  He apparently needs a right total knee replacement to be done by Dr. Thurston HoleWainer .  Unfortunately, with this degree of arterial  insufficiency I am concerned that his surgical wound would not heal.  I have discussed this with Dr. Noemi Chapel who agrees.  Based on this I am arranging for him to undergo peripheral angiography and right lower extremity endovascular therapy to improve blood flow and subsequently allow him to have his right total knee replacement.  He presents today for angiography and endovascular therapy for high-grade diffuse right SFA disease in order to allow him to undergo elective right total knee replacement with adequate circulation for healing. Pre Procedure Diagnosis: Peripheral arterial disease Post Procedure Diagnosis: Peripheral arterial disease Operators: Dr. Quay Burow Procedures Performed:  1.  Ultrasound-guided left common femoral artery access  2.  Abdominal aortogram/bilateral iliac angiogram/bifemoral runoff  3.  Contralateral access (secondary catheter placement)  4.  Placement of an NAV 6 distal protection device in the right below-the-knee popliteal artery.  5.  Diamondback orbital atherectomy, PTA and drug-eluting stenting of the entire right SFA  6.  Left common femoral angiogram, placement of  Mynx closure device PROCEDURE DESCRIPTION: The patient was brought to the second floor [facility] cath lab in the the postabsorptive state. He was premedicated with IV Versed and fentanyl. His left groin was prepped and shaved in usual sterile fashion. Xylocaine 1% was used for local anesthesia. A 5 French sheath was inserted into the left common femoral artery using standard Seldinger technique.  Ultrasound was used to identify the artery and guide access in real-time.  A digital image of the ultrasound-guided access was captured and placed in the patient's chart.  A 5 French pigtail catheter was placed in the distal abdominal aorta.  Distal abdominal aortography, bilateral iliac angiography with bifemoral runoff was performed using bolus chase, digital subtraction and step table technique.  Isovue dye was used for the entirety of the case.  Retrograde aortic pressures monitored in the case.  Angiographic Data: 1: Abdominal aortogram-the distal abdominal aorta was free of significant disease 2: Left lower extremity-the entire stented segment was patent although the proximal half of the left SFA stent had diffuse 80% in-stent restenosis.  There was three-vessel runoff 3: Right lower extremity-the entire proximal, mid and distal right SFA was highly calcified and subtotally occluded.  There is three-vessel runoff    Steven Ford has a patent left SFA SUPERA stent although there is 80% in-stent restenosis within the proximal half.  He has high-grade subtotal proximal, mid and distal calcified right SFA stenosis with three-vessel runoff.  We will proceed with orbital atherectomy, PTA and drug eluding stenting using distal protection. Procedure Description: Contralateral access was obtained with a crossover catheter, Glidewire, Rosen wire and a 7 French 55 cm multipurpose Ansell sheath.  The patient received a total of 21,000 units of heparin with an ACT of 285.  Total contrast administered to the patient was  170 cc.  I was able to cross the lesion with a 014 Rigali wire and a Turnpike catheter.  Then exchanged for an 014 Viper wire and performed orbital atherectomy with a 1.25 mm bur.  Following this I exchanged the 014 Viper for the 014/1 7 Viper through it 018 until catheter in place today and a V6 distal protection device and the below the knee popliteal artery.  I performed orbital atherectomy with a 2 mm bur up 220,000 RPMs.  I then performed PTA of the entire atherectomized segment with a 4 mm x 120 mm balloon followed by a 5 mm balloon.  Following this I placed an 018 Zilient and 4  g wire next to the Viper wire and retrieved the filter.  I then performed PTA with a 6 mm x 150 mm balloon and placed overlapping 6 mm x 120 mm long ELUVIA drug-eluting stents across the entire atherectomized segment post dilating with a 6 mm balloon resulting in reduction of a 95 to 99% calcified proximal, mid and distal right SFA stenosis to 0% residual.  There was one area that had approximately 30 to 40% residual disease because of inability to fully expand the stent due to heavy calcification. Final Impression: 80% in-stent restenosis within the previously placed SUPERA stent in the left SFA.  Successful right SFA orbital atherectomy, PTA and drug coated stenting using distal protection for high-grade proximal, mid and distal calcified right SFA stenosis using ELUVIA  drug eluding stents.  The patient had a palpable right pedal pulse post procedure.  I did perform a left common femoral angiogram and placed a minx closure device successfully achieving hemostasis.  He received 300 mg of p.o. Plavix.  He left lab in stable condition.  He will be hydrated overnight, discharged home in the morning.  We will get lower extremity arterial Doppler studies in our [facility] line office next week and I will see him back 2 weeks out thereafter. Nanetta Batty. MD, [facility] 02/18/2020 1:10 PM   Disposition   Pt is being discharged home today in good  condition.  Follow-up Plans & Appointments    Follow-up Information    [facility]. Go on 03/02/2020.   Specialty: Cardiology Why: @10am  for PV study Contact information: [address] [place] [phone]         Discharge Instructions    Diet - low sodium heart healthy   Complete by: As directed    Discharge instructions   Complete by: As directed    No driving for 48 hours. No lifting over 5 lbs for 1 week. No sexual activity for 1 week. You may return to work on 02/22/20. Keep procedure site clean & dry. If you notice increased pain, swelling, bleeding or pus, call/return!  You may shower, but no soaking baths/hot tubs/pools for 1 week.   Increase activity slowly   Complete by: As directed       Discharge Medications   Allergies as of 02/19/2020      Reactions   Influenza Vaccines Other (See Comments)   Pt gets sick or sicker with flu shot       Medication List    TAKE these medications   aspirin EC 81 MG tablet Take 81 mg by mouth daily.   atorvastatin 40 MG tablet Commonly known as: LIPITOR Take 1 tablet (40 mg total) by mouth daily at 6 PM.   clopidogrel 75 MG tablet Commonly known as: PLAVIX Take 1 tablet (75 mg total) by mouth daily with breakfast.   lisinopril 10 MG tablet Commonly known as: ZESTRIL Take 1 tablet (10 mg total) by mouth daily.   meloxicam 15 MG tablet Commonly known as: MOBIC Take 15 mg by mouth daily as needed for pain.   metoprolol succinate 25 MG 24 hr tablet Commonly known as: TOPROL-XL Take 1 tablet (25 mg total) by mouth daily for 15 days.   multivitamin with minerals tablet Take 1 tablet by mouth daily.   Praluent 150 MG/ML Soaj Generic drug: Alirocumab Inject 150 mg into the skin every 14 (fourteen) days.   PRESCRIPTION MEDICATION Apply 1 application topically in the morning and at bedtime.  HYLATOPICPLUS CREAM   tadalafil 20 MG tablet Commonly known as:  Cialis Take 1 tablet (20 mg total) by mouth daily as needed for erectile dysfunction.          Outstanding Labs/Studies   Lipid panel and LFTs in 6-8 weeks  Duration of Discharge Encounter   Greater than 30 minutes including physician time.  SignedSharrell Ku Lerna, PA 02/19/2020, 8:42 AM  I have personally seen and examined this patient. I agree with the assessment and plan as outlined above.  He is doing well this am post PTA/stenting of the right superficial femoral artery. Will plan to d/c home today on ASA and Plavix.   Verne Carrow 02/19/2020 9:13 AM

## 2020-02-19 NOTE — Discharge Instructions (Signed)
Information about your medication: Plavix (anti-platelet agent)  Generic Name (Brand): clopidogrel (Plavix), once daily medication  PURPOSE: You are taking this medication along with aspirin to lower your chance of having a heart attack, stroke, or blood clots in your stent. These can be fatal. Plavix and aspirin help prevent platelets from sticking together and forming a clot that can block an artery or your stent.   Common SIDE EFFECTS you may experience include: bruising or bleeding more easily, shortness of breath  Do not stop taking PLAVIX without talking to the doctor who prescribes it for you. People who are treated with a stent and stop taking Plavix too soon, have a higher risk of getting a blood clot in the stent, having a heart attack, or dying. If you stop Plavix because of bleeding, or for other reasons, your risk of a heart attack or stroke may increase.   Femoral Site Care This sheet gives you information about how to care for yourself after your procedure. Your health care provider may also give you more specific instructions. If you have problems or questions, contact your health care provider. What can I expect after the procedure? After the procedure, it is common to have:  Bruising that usually fades within 1-2 weeks.  Tenderness at the site. Follow these instructions at home: Wound care  Follow instructions from your health care provider about how to take care of your insertion site. Make sure you: ? Wash your hands with soap and water before you change your bandage (dressing). If soap and water are not available, use hand sanitizer. ? Change your dressing as told by your health care provider. ? Leave stitches (sutures), skin glue, or adhesive strips in place. These skin closures may need to stay in place for 2 weeks or longer. If adhesive strip edges start to loosen and curl up, you may trim the loose edges. Do not remove adhesive strips completely unless your health  care provider tells you to do that.  Do not take baths, swim, or use a hot tub until your health care provider approves.  You may shower 24-48 hours after the procedure or as told by your health care provider. ? Gently wash the site with plain soap and water. ? Pat the area dry with a clean towel. ? Do not rub the site. This may cause bleeding.  Do not apply powder or lotion to the site. Keep the site clean and dry.  Check your femoral site every day for signs of infection. Check for: ? Redness, swelling, or pain. ? Fluid or blood. ? Warmth. ? Pus or a bad smell. Activity  For the first 2-3 days after your procedure, or as long as directed: ? Avoid climbing stairs as much as possible. ? Do not squat.  Do not lift anything that is heavier than 10 lb (4.5 kg), or the limit that you are told, until your health care provider says that it is safe.  Rest as directed. ? Avoid sitting for a long time without moving. Get up to take short walks every 1-2 hours.  Do not drive for 24 hours if you were given a medicine to help you relax (sedative). General instructions  Take over-the-counter and prescription medicines only as told by your health care provider.  Keep all follow-up visits as told by your health care provider. This is important. Contact a health care provider if you have:  A fever or chills.  You have redness, swelling, or pain around your  insertion site. Get help right away if:  The catheter insertion area swells very fast.  You pass out.  You suddenly start to sweat or your skin gets clammy.  The catheter insertion area is bleeding, and the bleeding does not stop when you hold steady pressure on the area.  The area near or just beyond the catheter insertion site becomes pale, cool, tingly, or numb. These symptoms may represent a serious problem that is an emergency. Do not wait to see if the symptoms will go away. Get medical help right away. Call your local  emergency services (911 in the [place] Do not drive yourself to the hospital. Summary  After the procedure, it is common to have bruising that usually fades within 1-2 weeks.  Check your femoral site every day for signs of infection.  Do not lift anything that is heavier than 10 lb (4.5 kg), or the limit that you are told, until your health care provider says that it is safe. This information is not intended to replace advice given to you by your health care provider. Make sure you discuss any questions you have with your health care provider. Document Revised: 11/25/2017 Document Reviewed: 11/25/2017 Elsevier Patient Education  2020 [employer].   Tell all of your doctors and dentists that you are taking Plavix. They should talk to the doctor who prescribed plavix for you before you have any surgery or invasive procedure.   Contact your health care provider if you experience: severe or uncontrollable bleeding, pink/red/brown urine, vomiting blood or vomit that looks like "coffee grounds", red or black stools (looks like tar), coughing up blood or blood clots ----------------------------------------------------------------------------------------------------------------------

## 2020-02-23 ENCOUNTER — Ambulatory Visit: Payer: BC Managed Care – PPO

## 2020-02-23 ENCOUNTER — Telehealth: Payer: Self-pay | Admitting: Cardiovascular Disease

## 2020-02-23 NOTE — Telephone Encounter (Signed)
Patient states he had a procedure done last week, and is in pain.  He is wondering if he can get something for the pain.

## 2020-02-23 NOTE — Telephone Encounter (Signed)
Spoke with patient. Patient is pending surgery for his knee. Patient is complaining of knee pain and wants to know what he can take. Patient reports he was told to stop meloxicam when he went in for his procedure. Patient reports he has an occasional throb at his surgical site but nothing that doesn't pass or that he can't handle. Patient advised OTC tylenol can be taken with current medications but that he needs to contact his PCP or the surgeon who will be doing his knee operation for recommendations on pain medication. Patient should avoid NSAIDs. Patient verbalized understanding.

## 2020-03-01 ENCOUNTER — Encounter (HOSPITAL_COMMUNITY): Payer: Self-pay

## 2020-03-02 ENCOUNTER — Ambulatory Visit (HOSPITAL_COMMUNITY): Payer: BC Managed Care – PPO

## 2020-03-04 ENCOUNTER — Other Ambulatory Visit: Payer: Self-pay

## 2020-03-04 ENCOUNTER — Ambulatory Visit: Payer: BC Managed Care – PPO | Admitting: Cardiovascular Disease

## 2020-03-04 ENCOUNTER — Other Ambulatory Visit (HOSPITAL_COMMUNITY): Payer: Self-pay | Admitting: Cardiovascular Disease

## 2020-03-04 ENCOUNTER — Ambulatory Visit (HOSPITAL_COMMUNITY)
Admission: RE | Admit: 2020-03-04 | Discharge: 2020-03-04 | Disposition: A | Discharge status: Home / Self Care | Payer: BC Managed Care – PPO | Source: Ambulatory Visit | Attending: Cardiovascular Disease | Admitting: Cardiovascular Disease

## 2020-03-04 DIAGNOSIS — I739 Peripheral vascular disease, unspecified: Secondary | ICD-10-CM

## 2020-03-07 ENCOUNTER — Telehealth: Payer: Self-pay | Admitting: Emergency Medicine

## 2020-03-07 NOTE — Telephone Encounter (Signed)
I would like him to stay on antiplatelet Rx for at least 3 months before we interrupt it for TKR.  JJB

## 2020-03-07 NOTE — Telephone Encounter (Signed)
   Primary Cardiologist: Gypsy Balsam, MD  Chart reviewed as part of pre-operative protocol coverage. Per Dr. Allyson Sabal, patient should remain on uninterrupted DAPT for 3 months following SFA stenting 02/18/20. Surgery should be delayed until 05/2020.   Pre-op covering staff: - Please contact requesting surgeon's office via preferred method (i.e, phone, fax) to inform them of need to delay surgery. They should submit another preoperative request closer to the time of surgery to better assess his preoperative risk at that time.  Beatriz Stallion, PA-C 03/07/2020, 1:58 PM

## 2020-03-07 NOTE — Telephone Encounter (Signed)
   [facility] Pre-operative Risk Assessment    Request for surgical clearance:  1. What type of surgery is being performed? Right total knee replacement     2. When is this surgery scheduled? To be determined    3. What type of clearance is required (medical clearance vs. Pharmacy clearance to hold med vs. Both)? Medical   4. Are there any medications that need to be held prior to surgery and how long?None specified    5. Practice name and name of physician performing surgery? Elsie Saas, MD, [facility]    6. What is your office phone number [phone]    7.   What is your office fax number [phone]  8.   Anesthesia type (None, local, MAC, general) ? Not specified    Ashok Norris 03/07/2020, 9:36 AM  _________________________________________________________________   (provider comments below)

## 2020-03-07 NOTE — Telephone Encounter (Signed)
   Primary Cardiologist:Robert Bing Matter, MD  Chart reviewed as part of pre-operative protocol coverage. Patient is s/p right SFA orbital atherectomy with drug-eluting stent placement 02/18/20 with patent stent and improvement in blood flow s/p intervention on surveillance LEA duplex 03/04/20. He is scheduled to see Dr. Allyson Sabal 03/15/20 for post-intervention follow-up. Will route to Dr. Allyson Sabal to address preoperative status at that time.    Pre-op covering staff: - Please contact requesting surgeon's office via preferred method (i.e, phone, fax) to inform them of his upcoming appointment with Dr. Allyson Sabal.    Beatriz Stallion, PA-C  03/07/2020, 10:10 AM

## 2020-03-08 ENCOUNTER — Telehealth: Payer: Self-pay

## 2020-03-08 NOTE — Telephone Encounter (Signed)
Spoke to patient this morning lower ext doppler results given.He wanted to ask Dr.Berry if he is cleared to have knee replacement.Message sent to Dr.Berry he advised ok to have knee replacement,but he needs to remain on Aspirin and Plavix uninterrupted for 3 months since he has a new stent.

## 2020-03-15 ENCOUNTER — Ambulatory Visit: Payer: BC Managed Care – PPO | Admitting: Cardiovascular Disease

## 2020-04-08 ENCOUNTER — Telehealth: Payer: Self-pay

## 2020-04-08 MED ORDER — CLOPIDOGREL BISULFATE 75 MG PO TABS: 75.0000 mg | ORAL_TABLET | Freq: Every day | ORAL | 2 refills | Status: DC

## 2020-04-08 NOTE — Telephone Encounter (Signed)
Pt was taken off of Atorvastatin by Dr. Allyson Sabal in March 2021. Refill sent for Clopidigrel.

## 2020-04-13 ENCOUNTER — Encounter: Payer: Self-pay | Admitting: Physician Assistant

## 2020-04-13 ENCOUNTER — Other Ambulatory Visit: Payer: Self-pay | Admitting: Cardiology

## 2020-04-13 MED ORDER — ATORVASTATIN CALCIUM 40 MG PO TABS: 40.0000 mg | ORAL_TABLET | Freq: Every day | ORAL | 3 refills | Status: DC

## 2020-04-13 NOTE — Telephone Encounter (Signed)
Refill sent in per request.  

## 2020-04-13 NOTE — Telephone Encounter (Signed)
error 

## 2020-04-13 NOTE — Telephone Encounter (Signed)
  *  STAT* If patient is at the pharmacy, call can be transferred to refill team.   1. Which medications need to be refilled? (please list name of each medication and dose if known)   atorvastatin (LIPITOR) 40 MG tablet    2. Which pharmacy/location (including street and city if local pharmacy) is medication to be sent to? [id number] - [place], [place] - [address]  3. Do they need a 30 day or 90 day supply? 90 days

## 2020-04-13 NOTE — Telephone Encounter (Signed)
  *  STAT* If patient is at the pharmacy, call can be transferred to refill team.   1. Which medications need to be refilled? (please list name of each medication and dose if known)  atorvastatin (LIPITOR) 40 MG tablet    2. Which pharmacy/location (including street and city if local pharmacy) is medication to be sent to?[id number] - [place], [place] - [address]  3. Do they need a 30 day or 90 day supply? 90 days  Pt said this was prescribed by DR. Allyson Sabal while he was in the hospital

## 2020-04-29 ENCOUNTER — Other Ambulatory Visit: Payer: Self-pay

## 2020-04-29 ENCOUNTER — Ambulatory Visit (INDEPENDENT_AMBULATORY_CARE_PROVIDER_SITE_OTHER): Payer: BC Managed Care – PPO | Admitting: Cardiovascular Disease

## 2020-04-29 ENCOUNTER — Encounter: Payer: Self-pay | Admitting: Cardiovascular Disease

## 2020-04-29 VITALS — BP 130/82 | HR 54 | Ht 70.0 in | Wt 205.0 lb

## 2020-04-29 DIAGNOSIS — R200 Anesthesia of skin: Secondary | ICD-10-CM

## 2020-04-29 DIAGNOSIS — E785 Hyperlipidemia, unspecified: Secondary | ICD-10-CM | POA: Diagnosis not present

## 2020-04-29 DIAGNOSIS — I739 Peripheral vascular disease, unspecified: Secondary | ICD-10-CM | POA: Diagnosis not present

## 2020-04-29 NOTE — Assessment & Plan Note (Signed)
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  Because of the need undergo right total knee replacement and known high-grade diffuse calcified disease in his right SFA I performed orbital atherectomy and drug-coated stenting of his entire right SFA 02/18/2020 with marked improvement in his Dopplers and symptoms as well.  At the time I did notice 80% "in-stent restenosis within his left SFA stent but he was denying claudication.  Over the last 2 weeks he is noticed some numbness in his left foot similar to his preintervention symptoms.  Concerned that his restenosis that he the progressed to his stent has occluded.  I am going to get lower extremity Dopplers on him early next week to further evaluate and if there is progression he will need reintervention.

## 2020-04-29 NOTE — Progress Notes (Signed)
04/29/2020 Steven Ford   1961-10-07  [id number]  Primary Physician Patient, No Pcp Per Primary Cardiologist: Runell Gess MD Roseanne Reno  HPI:  Steven Ford is a 59 y.o.  married, father of 2 boys, who referred to our cardiology clinic due to abnormal ABI.  I last saw him in the office 02/12/2020.  He has Hx orf premature CV disease: CAD s/o CABG 2002 and L. SFA intervention/stent placement 2019. He was supposed to have a rt knee surgery, and was seen by his cardiologist for pre-op eval.  Lower extremities US showed decreased right ABI and he was referred to Dr. Allyson Sabal for evaluation for intervenention. 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 He denies any claudication. No C.P, no DOE, no palpitation. Of note, recent echo 12/21/2019 shower decreased EF of 40%, grade 2DD,  with global hypokinesia. Stress Myoview did not show evidence of ischemia.   I performed peripheral angiography on him 02/18/2020 revealing 80% diffuse in-stent restenosis within the previously placed left SFA stent 2 years ago.  I atherectomized and performed drug coated self-expanding stenting of 99% diffuse calcified right SFA stenosis with marked improvement in his symptoms and normalization of his Doppler studies.  He noticed sudden onset of numbness in his left foot approximately 2 weeks ago similar to his preintervention symptoms.  Current Meds  Medication Sig  . Alirocumab (PRALUENT) 150 MG/ML SOAJ Inject 150 mg into the skin every 14 (fourteen) days.  Marland Kitchen aspirin EC 81 MG tablet Take 81 mg by mouth daily.  Marland Kitchen atorvastatin (LIPITOR) 40 MG tablet Take 1 tablet (40 mg total) by mouth daily at 6 PM.  . clopidogrel (PLAVIX) 75 MG tablet Take 1 tablet (75 mg total) by mouth daily with breakfast.  . lisinopril (ZESTRIL) 10 MG tablet Take 1 tablet (10 mg total) by mouth daily.  . Multiple Vitamins-Minerals (MULTIVITAMIN WITH MINERALS) tablet Take 1 tablet by  mouth daily.  Marland Kitchen PRESCRIPTION MEDICATION Apply 1 application topically in the morning and at bedtime. HYLATOPICPLUS CREAM  . tadalafil (CIALIS) 20 MG tablet Take 1 tablet (20 mg total) by mouth daily as needed for erectile dysfunction.     Allergies  Allergen Reactions  . Influenza Vaccines Other (See Comments)    Pt gets sick or sicker with flu shot     Social History   Socioeconomic History  . Marital status: Married    Spouse name: Not on file  . Number of children: Not on file  . Years of education: Not on file  . Highest education level: Not on file  Occupational History  . Not on file  Tobacco Use  . Smoking status: Light Tobacco Smoker    Years: 5.00    Types: Cigars  . Smokeless tobacco: Never Used  . Tobacco comment: 09/04/2018 "q time I play golf"  Substance and Sexual Activity  . Alcohol use: Yes    Alcohol/week: 2.0 standard drinks    Types: 2 Cans of beer per week  . Drug use: No  . Sexual activity: Yes  Other Topics Concern  . Not on file  Social History Narrative  . Not on file   Social Determinants of Health   Financial Resource Strain:   . Difficulty of Paying Living Expenses:   Food Insecurity:   . Worried About Programme researcher, broadcasting/film/video in the Last Year:   . [employer] of Food in the Last  Year:   Transportation Needs:   . Film/video editor (Medical):   Marland Kitchen Lack of Transportation (Non-Medical):   Physical Activity:   . Days of Exercise per Week:   . Minutes of Exercise per Session:   Stress:   . Feeling of Stress :   Social Connections:   . Frequency of Communication with Friends and Family:   . Frequency of Social Gatherings with Friends and Family:   . Attends Religious Services:   . Active Member of Clubs or Organizations:   . Attends Archivist Meetings:   Marland Kitchen Marital Status:   Intimate Partner Violence:   . Fear of Current or Ex-Partner:   . Emotionally Abused:   Marland Kitchen Physically Abused:   . Sexually Abused:      Review of  Systems: General: negative for chills, fever, night sweats or weight changes.  Cardiovascular: negative for chest pain, dyspnea on exertion, edema, orthopnea, palpitations, paroxysmal nocturnal dyspnea or shortness of breath Dermatological: negative for rash Respiratory: negative for cough or wheezing Urologic: negative for hematuria Abdominal: negative for nausea, vomiting, diarrhea, bright red blood per rectum, melena, or hematemesis Neurologic: negative for visual changes, syncope, or dizziness All other systems reviewed and are otherwise negative except as noted above.    Blood pressure 130/82, pulse (!) 54, height 5\' 10"  (1.778 m), weight 205 lb (93 kg), SpO2 94 %.  General appearance: alert and no distress Neck: no adenopathy, no carotid bruit, no JVD, supple, symmetrical, trachea midline and thyroid not enlarged, symmetric, no tenderness/mass/nodules Lungs: clear to auscultation bilaterally Heart: regular rate and rhythm, S1, S2 normal, no murmur, click, rub or gallop Extremities: extremities normal, atraumatic, no cyanosis or edema Pulses: Minutes left pedal pulse Skin: Skin color, texture, turgor normal. No rashes or lesions Neurologic: Alert and oriented X 3, normal strength and tone. Normal symmetric reflexes. Normal coordination and gait  EKG not performed today  ASSESSMENT AND PLAN:   Dyslipidemia History of dyslipidemia recently started on Repatha.  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  Because of the need undergo right total knee replacement and known high-grade diffuse calcified disease in his right SFA I performed orbital atherectomy and drug-coated stenting of his entire right SFA 02/18/2020 with marked improvement in his Dopplers and symptoms as well.  At the time I did notice 80% "in-stent  restenosis within his left SFA stent but he was denying claudication.  Over the last 2 weeks he is noticed some numbness in his left foot similar to his preintervention symptoms.  Concerned that his restenosis that he the progressed to his stent has occluded.  I am going to get lower extremity Dopplers on him early next week to further evaluate and if there is progression he will need reintervention.      Lorretta Harp MD FACP,FACC,FAHA, [facility] 04/29/2020 10:05 AM

## 2020-04-29 NOTE — Patient Instructions (Addendum)
Medication Instructions:  Your physician recommends that you continue on your current medications as directed. Please refer to the Current Medication list given to you today.  *If you need a refill on your cardiac medications before your next appointment, please call your pharmacy*   Lab Work: Lipid/Liver Panel (fasting)   If you have labs (blood work) drawn today and your tests are completely normal, you will receive your results only by: Marland Kitchen MyChart Message (if you have MyChart) OR . A paper copy in the mail If you have any lab test that is abnormal or we need to change your treatment, we will call you to review the results.   Testing/Procedures: Left Lower Extremity Arterial Doppler early next week    Follow-Up: At [facility], you and your health needs are our priority.  As part of our continuing mission to provide you with exceptional heart care, we have created designated Provider Care Teams.  These Care Teams include your primary Cardiologist (physician) and Advanced Practice Providers (APPs -  Physician Assistants and Nurse Practitioners) who all work together to provide you with the care you need, when you need it.  We recommend signing up for the patient portal called "MyChart".  Sign up information is provided on this After Visit Summary.  MyChart is used to connect with patients for Virtual Visits (Telemedicine).  Patients are able to view lab/test results, encounter notes, upcoming appointments, etc.  Non-urgent messages can be sent to your provider as well.   To learn more about what you can do with MyChart, go to [email or web address].    Your next appointment:   1 month(s)  The format for your next appointment:   In Person  Provider:   You may see Dr. Allyson Sabal or one of the following Advanced Practice Providers on your designated Care Team:    Corine Shelter, PA-C  [place], [place]  Edd Fabian, [place]    Other Instructions

## 2020-04-29 NOTE — Assessment & Plan Note (Signed)
History of dyslipidemia recently started on Repatha.  We will check a lipid liver profile today

## 2020-04-29 NOTE — Assessment & Plan Note (Signed)
>>  ASSESSMENT AND PLAN FOR DYSLIPIDEMIA WRITTEN ON 04/29/2020 10:03 AM BY BERRY, Delton See, MD  History of dyslipidemia recently started on Repatha.  We will check a lipid liver profile today

## 2020-05-03 ENCOUNTER — Ambulatory Visit (HOSPITAL_COMMUNITY)
Admission: RE | Admit: 2020-05-03 | Discharge: 2020-05-03 | Disposition: A | Discharge status: Home / Self Care | Payer: BC Managed Care – PPO | Source: Ambulatory Visit | Attending: Cardiovascular Disease | Admitting: Cardiovascular Disease

## 2020-05-03 ENCOUNTER — Other Ambulatory Visit: Payer: Self-pay

## 2020-05-03 DIAGNOSIS — I739 Peripheral vascular disease, unspecified: Secondary | ICD-10-CM | POA: Diagnosis not present

## 2020-05-03 DIAGNOSIS — R200 Anesthesia of skin: Secondary | ICD-10-CM | POA: Diagnosis not present

## 2020-05-05 ENCOUNTER — Other Ambulatory Visit: Payer: Self-pay | Admitting: Cardiology

## 2020-05-06 ENCOUNTER — Encounter: Payer: Self-pay | Admitting: Cardiovascular Disease

## 2020-05-06 ENCOUNTER — Ambulatory Visit (INDEPENDENT_AMBULATORY_CARE_PROVIDER_SITE_OTHER): Payer: BC Managed Care – PPO | Admitting: Cardiovascular Disease

## 2020-05-06 ENCOUNTER — Other Ambulatory Visit: Payer: Self-pay

## 2020-05-06 VITALS — BP 132/78 | HR 52 | Ht 70.0 in | Wt 200.0 lb

## 2020-05-06 DIAGNOSIS — I739 Peripheral vascular disease, unspecified: Secondary | ICD-10-CM | POA: Diagnosis not present

## 2020-05-06 DIAGNOSIS — E785 Hyperlipidemia, unspecified: Secondary | ICD-10-CM | POA: Diagnosis not present

## 2020-05-06 LAB — HEPATIC FUNCTION PANEL
ALT: 27 IU/L (ref 0–44)
AST: 29 IU/L (ref 0–40)
Albumin: 4.5 g/dL (ref 3.8–4.9)
Alkaline Phosphatase: 128 IU/L — ABNORMAL HIGH (ref 48–121)
Bilirubin Total: 0.3 mg/dL (ref 0.0–1.2)
Bilirubin, Direct: 0.14 mg/dL (ref 0.00–0.40)
Total Protein: 6.9 g/dL (ref 6.0–8.5)

## 2020-05-06 LAB — LIPID PANEL
Chol/HDL Ratio: 1.6 ratio (ref 0.0–5.0)
Cholesterol, Total: 75 mg/dL — ABNORMAL LOW (ref 100–199)
HDL: 46 mg/dL (ref >39)
LDL Chol Calc (NIH): 16 mg/dL (ref 0–99)
Triglycerides: 48 mg/dL (ref 0–149)
VLDL Cholesterol Cal: 13 mg/dL (ref 5–40)

## 2020-05-06 LAB — BASIC METABOLIC PANEL
BUN/Creatinine Ratio: 24 — ABNORMAL HIGH (ref 9–20)
BUN: 22 mg/dL (ref 6–24)
CO2: 21 mmol/L (ref 20–29)
Calcium: 8.6 mg/dL — ABNORMAL LOW (ref 8.7–10.2)
Chloride: 105 mmol/L (ref 96–106)
Creatinine, Ser: 0.92 mg/dL (ref 0.76–1.27)
GFR calc Af Amer: 105 mL/min/{1.73_m2} (ref >59)
GFR calc non Af Amer: 91 mL/min/{1.73_m2} (ref >59)
Glucose: 109 mg/dL — ABNORMAL HIGH (ref 65–99)
Potassium: 4.4 mmol/L (ref 3.5–5.2)
Sodium: 142 mmol/L (ref 134–144)

## 2020-05-06 LAB — TSH: TSH: 1.67 u[IU]/mL (ref 0.450–4.500)

## 2020-05-06 LAB — CBC
Hematocrit: 46.4 % (ref 37.5–51.0)
Hemoglobin: 14.9 g/dL (ref 13.0–17.7)
MCH: 29.2 pg (ref 26.6–33.0)
MCHC: 32.1 g/dL (ref 31.5–35.7)
MCV: 91 fL (ref 79–97)
Platelets: 273 10*3/uL (ref 150–450)
RBC: 5.1 x10E6/uL (ref 4.14–5.80)
RDW: 13.3 % (ref 11.6–15.4)
WBC: 5.1 10*3/uL (ref 3.4–10.8)

## 2020-05-06 MED ORDER — SODIUM CHLORIDE 0.9% FLUSH: 3.0000 mL | Freq: Two times a day (BID) | INTRAVENOUS | Status: DC

## 2020-05-06 NOTE — Addendum Note (Signed)
Addended by: Darene Lamer T on: 05/06/2020 04:35 PM   Modules accepted: Orders, SmartSet

## 2020-05-06 NOTE — Patient Instructions (Signed)
Medication Instructions:  The current medical regimen is effective;  continue present plan and medications.  *If you need a refill on your cardiac medications before your next appointment, please call your pharmacy*   Lab Work: BMET, CBC, TSH, LIPID/LIVER today   If you have labs (blood work) drawn today and your tests are completely normal, you will receive your results only by: Marland Kitchen MyChart Message (if you have MyChart) OR . A paper copy in the mail If you have any lab test that is abnormal or we need to change your treatment, we will call you to review the results.   Testing/Procedures: Your physician has requested that you have a peripheral vascular angiogram. This exam is performed at the hospital. During this exam IV contrast is used to look at arterial blood flow. Please review the information sheet given for details.  Your physician has requested that you have a lower or upper extremity arterial duplex (1 week after). This test is an ultrasound of the arteries in the legs or arms. It looks at arterial blood flow in the legs and arms. Allow one hour for Lower and Upper Arterial scans. There are no restrictions or special instructions  Your physician has requested that you have an ankle brachial index (ABI) (1 week after) . During this test an ultrasound and blood pressure cuff are used to evaluate the arteries that supply the arms and legs with blood. Allow thirty minutes for this exam. There are no restrictions or special instructions.   Follow-Up: At [facility], you and your health needs are our priority.  As part of our continuing mission to provide you with exceptional heart care, we have created designated Provider Care Teams.  These Care Teams include your primary Cardiologist (physician) and Advanced Practice Providers (APPs -  Physician Assistants and Nurse Practitioners) who all work together to provide you with the care you need, when you need it.  We recommend signing up  for the patient portal called "MyChart".  Sign up information is provided on this After Visit Summary.  MyChart is used to connect with patients for Virtual Visits (Telemedicine).  Patients are able to view lab/test results, encounter notes, upcoming appointments, etc.  Non-urgent messages can be sent to your provider as well.   To learn more about what you can do with MyChart, go to [email or web address].    Your next appointment:   2 week(s)  The format for your next appointment:   In Person  Provider:   Quay Burow, MD   Other Instructions    [address] [place] [address] Dept: [phone] Loc: [place]  05/06/2020  You are scheduled for a Peripheral Angiogram on Monday, June 28 with Dr. Quay Burow.  1. Please arrive at the [facility] (Main Entrance A) at [facility]: [address] [place], [place] [address] at 5:30 AM (This time is two hours before your procedure to ensure your preparation). Free valet parking service is available.   Special note: Every effort is made to have your procedure done on time. Please understand that emergencies sometimes delay scheduled procedures.  2. Diet: Do not eat solid foods after midnight.  The patient may have clear liquids until 5am upon the day of the procedure.  3. Labs: You will need to have blood drawn today. CBC, BMET, TSH. You do not need to be fasting.  4. Medication instructions in preparation  for your procedure:   Contrast Allergy: No  On the morning of your procedure, take your Plavix/Clopidogrel and Aspirin and any morning medicines NOT listed above.  You may use sips of water.  5. Plan for one night stay--bring personal belongings. 6. Bring a current list of your medications and current insurance cards. 7. You MUST have a responsible person to drive you home. 8. Someone MUST  be with you the first 24 hours after you arrive home or your discharge will be delayed. 9. Please wear clothes that are easy to get on and off and wear slip-on shoes.  Thank you for allowing [place] to care for you!   -- [facility]

## 2020-05-06 NOTE — Progress Notes (Signed)
Mr. Genova returns for follow-up.  His lower extremity arterial Doppler studies show a decrease in left ABI with monophasic waveforms from the proximal to mid left SFA down consistent with his previous angiogram performed 02/22/2020 which showed 80% "in-stent restenosis" within his previously placed stent.  He is asymptomatic.  We will schedule him to undergo peripheral angiography next Thursday with possible intravascular laser revascularization followed by DCB.  In addition, his most recent lipid profile performed 12/11/2019 showed total cholesterol 197 with an LDL of 133 on atorvastatin 40.  He has recently begun Praluent and has taken 3 doses.  We will recheck a lipid liver profile this morning.  Runell Gess, M.D., FACP, [facility], Earl Lagos [facility] [address] [place], [place]  [address]  [phone] 05/06/2020 8:40 AM

## 2020-05-06 NOTE — H&P (View-Only) (Signed)
Mr. Massmann returns for follow-up.  His lower extremity arterial Doppler studies show a decrease in left ABI with monophasic waveforms from the proximal to mid left SFA down consistent with his previous angiogram performed 02/22/2020 which showed 80% "in-stent restenosis" within his previously placed stent.  He is asymptomatic.  We will schedule him to undergo peripheral angiography next Thursday with possible intravascular laser revascularization followed by DCB.  In addition, his most recent lipid profile performed 12/11/2019 showed total cholesterol 197 with an LDL of 133 on atorvastatin 40.  He has recently begun Praluent and has taken 3 doses.  We will recheck a lipid liver profile this morning.  Jaevon Paras J. Dorrian Doggett, M.D., FACP, FACC, FAHA, FSCAI [facility] [address] [place], [place]  [address]  [phone] 05/06/2020 8:40 AM  

## 2020-05-19 ENCOUNTER — Telehealth: Payer: Self-pay | Admitting: *Deleted

## 2020-05-19 NOTE — Telephone Encounter (Addendum)
Pt contacted pre-abdominal aortogram scheduled at [facility] for: Monday May 23, 2020 7:30 AM Verified arrival time and place: [facility] Main Entrance A [facility]) at: 5:30 AM   No solid food after midnight prior to cath, clear liquids until 5 AM day of procedure.  Hold: Cialis-until post procedure  AM meds can be  taken pre-cath with sips of water including: ASA 81 mg Plavix 75 mg  Confirmed patient has responsible adult to drive home post procedure and observe 24 hours after arriving home: yes  You are allowed ONE visitor in the waiting room during your procedure. Both you and your visitor must wear masks.      COVID-19 Pre-Screening Questions:  . In the past 7 to 10 days have you had a new cough, shortness of breath, headache, congestion, fever (100 or greater) unexplained body aches, new sore throat, or sudden loss of taste or sense of smell? no . In the past 7 to 10 days have you been around anyone with known Covid 19? no   Reviewed procedure/mask/visitor instructions, COVID-19 screening questions with patient.

## 2020-05-23 ENCOUNTER — Other Ambulatory Visit: Payer: Self-pay

## 2020-05-23 ENCOUNTER — Ambulatory Visit (HOSPITAL_COMMUNITY)
Admission: RE | Admit: 2020-05-23 | Discharge: 2020-05-23 | Disposition: A | Discharge status: Home / Self Care | Payer: BC Managed Care – PPO | Attending: Cardiovascular Disease | Admitting: Cardiovascular Disease

## 2020-05-23 ENCOUNTER — Encounter (HOSPITAL_COMMUNITY)
Admission: RE | Disposition: A | Discharge status: Home / Self Care | Payer: Self-pay | Source: Home / Self Care | Attending: Cardiovascular Disease

## 2020-05-23 DIAGNOSIS — I739 Peripheral vascular disease, unspecified: Secondary | ICD-10-CM | POA: Diagnosis present

## 2020-05-23 DIAGNOSIS — I70212 Atherosclerosis of native arteries of extremities with intermittent claudication, left leg: Secondary | ICD-10-CM | POA: Insufficient documentation

## 2020-05-23 DIAGNOSIS — E785 Hyperlipidemia, unspecified: Secondary | ICD-10-CM | POA: Insufficient documentation

## 2020-05-23 DIAGNOSIS — Z79899 Other long term (current) drug therapy: Secondary | ICD-10-CM | POA: Diagnosis not present

## 2020-05-23 HISTORY — PX: ABDOMINAL AORTOGRAM W/LOWER EXTREMITY: CATH118223

## 2020-05-23 HISTORY — PX: PERIPHERAL VASCULAR ATHERECTOMY: CATH118256

## 2020-05-23 HISTORY — PX: PERIPHERAL VASCULAR BALLOON ANGIOPLASTY: CATH118281

## 2020-05-23 LAB — POCT ACTIVATED CLOTTING TIME
Activated Clotting Time: 279 seconds
Activated Clotting Time: 230 seconds
Activated Clotting Time: 307 seconds
Activated Clotting Time: 191 seconds
Activated Clotting Time: 164 seconds

## 2020-05-23 SURGERY — ABDOMINAL AORTOGRAM W/LOWER EXTREMITY
Anesthesia: LOCAL | Laterality: Right

## 2020-05-23 MED ORDER — SODIUM CHLORIDE 0.9 % IV SOLN: INTRAVENOUS | Status: AC

## 2020-05-23 MED ORDER — FENTANYL CITRATE (PF) 100 MCG/2ML IJ SOLN
INTRAMUSCULAR | Status: DC | PRN
  Administered 2020-05-23: 25 ug via INTRAVENOUS

## 2020-05-23 MED ORDER — HEPARIN SODIUM (PORCINE) 1000 UNIT/ML IJ SOLN
INTRAMUSCULAR | Status: AC
  Filled 2020-05-23: qty 2

## 2020-05-23 MED ORDER — SODIUM CHLORIDE 0.9 % WEIGHT BASED INFUSION
3.0000 mL/kg/h | INTRAVENOUS | Status: AC
  Administered 2020-05-23: 3 mL/kg/h via INTRAVENOUS

## 2020-05-23 MED ORDER — SODIUM CHLORIDE 0.9 % IV SOLN: 250.0000 mL | INTRAVENOUS | Status: DC | PRN

## 2020-05-23 MED ORDER — IODIXANOL 320 MG/ML IV SOLN
INTRAVENOUS | Status: DC | PRN
  Administered 2020-05-23: 235 mL via INTRA_ARTERIAL

## 2020-05-23 MED ORDER — ASPIRIN EC 81 MG PO TBEC: 81.0000 mg | DELAYED_RELEASE_TABLET | Freq: Every day | ORAL | Status: DC

## 2020-05-23 MED ORDER — LIDOCAINE HCL (PF) 1 % IJ SOLN
INTRAMUSCULAR | Status: DC | PRN
  Administered 2020-05-23: 12 mL

## 2020-05-23 MED ORDER — LABETALOL HCL 5 MG/ML IV SOLN: 10.0000 mg | INTRAVENOUS | Status: DC | PRN

## 2020-05-23 MED ORDER — MIDAZOLAM HCL 2 MG/2ML IJ SOLN
INTRAMUSCULAR | Status: DC | PRN
  Administered 2020-05-23: 1 mg via INTRAVENOUS

## 2020-05-23 MED ORDER — ONDANSETRON HCL 4 MG/2ML IJ SOLN: 4.0000 mg | Freq: Four times a day (QID) | INTRAMUSCULAR | Status: DC | PRN

## 2020-05-23 MED ORDER — ASPIRIN 81 MG PO CHEW: 81.0000 mg | CHEWABLE_TABLET | ORAL | Status: DC

## 2020-05-23 MED ORDER — HEPARIN (PORCINE) IN NACL 1000-0.9 UT/500ML-% IV SOLN
INTRAVENOUS | Status: AC
  Filled 2020-05-23: qty 1000

## 2020-05-23 MED ORDER — SODIUM CHLORIDE 0.9% FLUSH: 3.0000 mL | Freq: Two times a day (BID) | INTRAVENOUS | Status: DC

## 2020-05-23 MED ORDER — SODIUM CHLORIDE 0.9% FLUSH: 3.0000 mL | INTRAVENOUS | Status: DC | PRN

## 2020-05-23 MED ORDER — LIDOCAINE HCL (PF) 1 % IJ SOLN
INTRAMUSCULAR | Status: AC
  Filled 2020-05-23: qty 30

## 2020-05-23 MED ORDER — FENTANYL CITRATE (PF) 100 MCG/2ML IJ SOLN
INTRAMUSCULAR | Status: AC
  Filled 2020-05-23: qty 2

## 2020-05-23 MED ORDER — MIDAZOLAM HCL 2 MG/2ML IJ SOLN
INTRAMUSCULAR | Status: AC
  Filled 2020-05-23: qty 2

## 2020-05-23 MED ORDER — HEPARIN SODIUM (PORCINE) 1000 UNIT/ML IJ SOLN
INTRAMUSCULAR | Status: DC | PRN
  Administered 2020-05-23: 10000 [IU] via INTRAVENOUS
  Administered 2020-05-23: 5000 [IU] via INTRAVENOUS

## 2020-05-23 MED ORDER — CLOPIDOGREL BISULFATE 75 MG PO TABS: 75.0000 mg | ORAL_TABLET | Freq: Every day | ORAL | Status: DC

## 2020-05-23 MED ORDER — CLOPIDOGREL BISULFATE 75 MG PO TABS
75.0000 mg | ORAL_TABLET | Freq: Once | ORAL | Status: AC
  Administered 2020-05-23: 75 mg via ORAL
  Filled 2020-05-23: qty 1

## 2020-05-23 MED ORDER — ACETAMINOPHEN 325 MG PO TABS: 650.0000 mg | ORAL_TABLET | ORAL | Status: DC | PRN

## 2020-05-23 MED ORDER — HYDRALAZINE HCL 20 MG/ML IJ SOLN: 5.0000 mg | INTRAMUSCULAR | Status: DC | PRN

## 2020-05-23 MED ORDER — HEPARIN (PORCINE) IN NACL 1000-0.9 UT/500ML-% IV SOLN
INTRAVENOUS | Status: DC | PRN
  Administered 2020-05-23 (×2): 500 mL

## 2020-05-23 MED ORDER — SODIUM CHLORIDE 0.9 % WEIGHT BASED INFUSION: 1.0000 mL/kg/h | INTRAVENOUS | Status: DC

## 2020-05-23 SURGICAL SUPPLY — 28 items
BALLN ADMIRAL INPACT 5X250 (BALLOONS) ×3
BALLOON ADMIRAL INPACT 5X250 (BALLOONS) ×2 IMPLANT
CATH ANGIO 5F PIGTAIL 65CM (CATHETERS) ×3 IMPLANT
CATH AURYON 5FR ATHEREC 1.5 (CATHETERS) ×3 IMPLANT
CATH CROSS OVER TEMPO 5F (CATHETERS) ×3 IMPLANT
CATH QUICKCROSS .018X135CM (MICROCATHETER) ×3 IMPLANT
CATH QUICKCROSS SUPP .018X90CM (MICROCATHETER) ×3 IMPLANT
DCB RANGER 5.0X100 135 (BALLOONS) ×2 IMPLANT
DEVICE SPIDERFX EMB PROT 6MM (WIRE) ×3 IMPLANT
GUIDEWIRE ZILIENT 6G 018 (WIRE) ×3 IMPLANT
KIT ENCORE 26 ADVANTAGE (KITS) ×3 IMPLANT
KIT MICROPUNCTURE NIT STIFF (SHEATH) IMPLANT
KIT PV (KITS) ×3 IMPLANT
RANGER DCB 5.0X100 135 (BALLOONS) ×3
SHEATH HIGHFLEX ANSEL 7FR 55CM (SHEATH) ×3 IMPLANT
SHEATH PINNACLE 5F 10CM (SHEATH) ×3 IMPLANT
SHEATH PINNACLE 7F 10CM (SHEATH) ×3 IMPLANT
SHEATH PROBE COVER 6X72 (BAG) ×3 IMPLANT
STOPCOCK MORSE 400PSI 3WAY (MISCELLANEOUS) ×3 IMPLANT
SYR MEDRAD MARK 7 150ML (SYRINGE) ×3 IMPLANT
TAPE VIPERTRACK RADIOPAQ (MISCELLANEOUS) ×2 IMPLANT
TAPE VIPERTRACK RADIOPAQUE (MISCELLANEOUS) ×2
TRANSDUCER W/STOPCOCK (MISCELLANEOUS) ×3 IMPLANT
TRAY PV CATH (CUSTOM PROCEDURE TRAY) ×3 IMPLANT
TUBING CIL FLEX 10 FLL-RA (TUBING) ×3 IMPLANT
WIRE HITORQ VERSACORE ST 145CM (WIRE) ×3 IMPLANT
WIRE ROSEN-J .035X180CM (WIRE) ×3 IMPLANT
WIRE SPARTACORE .014X300CM (WIRE) ×3 IMPLANT

## 2020-05-23 NOTE — Progress Notes (Addendum)
Assumed care of pt from B. Patton Salles, RN at [phone]. Assessment documented.

## 2020-05-23 NOTE — Interval H&P Note (Signed)
History and Physical Interval Note:  05/23/2020 7:49 AM  Steven Ford  has presented today for surgery, with the diagnosis of PAD.  The various methods of treatment have been discussed with the patient and family. After consideration of risks, benefits and other options for treatment, the patient has consented to  Procedure(s): ABDOMINAL AORTOGRAM W/LOWER EXTREMITY (Right) as a surgical intervention.  The patient's history has been reviewed, patient examined, no change in status, stable for surgery.  I have reviewed the patient's chart and labs.  Questions were answered to the patient's satisfaction.     Nanetta Batty

## 2020-05-23 NOTE — Progress Notes (Signed)
Site area: Right groin a 7 french arterial sheath was removed  Site Prior to Removal:  Level 0  Pressure Applied For 20 MINUTES    Bedrest Beginning at  1210p  Manual:   Yes.    Patient Status During Pull:  stable  Post Pull Groin Site:  Level 0  Post Pull Instructions Given:  Yes.    Post Pull Pulses Present:  Yes.    Dressing Applied:  Yes.    Comments:

## 2020-05-23 NOTE — Progress Notes (Signed)
Discharge instructions reviewed with patient and family. Verbalized understanding. Report given to Liborio Nixon, [place].

## 2020-05-23 NOTE — Discharge Instructions (Signed)
Femoral Site Care This sheet gives you information about how to care for yourself after your procedure. Your health care provider may also give you more specific instructions. If you have problems or questions, contact your health care provider. What can I expect after the procedure? After the procedure, it is common to have:  Bruising that usually fades within 1-2 weeks.  Tenderness at the site. Follow these instructions at home: Wound care  Follow instructions from your health care provider about how to take care of your insertion site. Make sure you: ? Wash your hands with soap and water before you change your bandage (dressing). If soap and water are not available, use hand sanitizer. ? Change your dressing as told by your health care provider. ? Leave stitches (sutures), skin glue, or adhesive strips in place. These skin closures may need to stay in place for 2 weeks or longer. If adhesive strip edges start to loosen and curl up, you may trim the loose edges. Do not remove adhesive strips completely unless your health care provider tells you to do that.  Do not take baths, swim, or use a hot tub until your health care provider approves.  You may shower 24-48 hours after the procedure or as told by your health care provider. ? Gently wash the site with plain soap and water. ? Pat the area dry with a clean towel. ? Do not rub the site. This may cause bleeding.  Do not apply powder or lotion to the site. Keep the site clean and dry.  Check your femoral site every day for signs of infection. Check for: ? Redness, swelling, or pain. ? Fluid or blood. ? Warmth. ? Pus or a bad smell. Activity  For the first 2-3 days after your procedure, or as long as directed: ? Avoid climbing stairs as much as possible. ? Do not squat.  Do not lift anything that is heavier than 10 lb (4.5 kg), or the limit that you are told, until your health care provider says that it is safe.  Rest as  directed. ? Avoid sitting for a long time without moving. Get up to take short walks every 1-2 hours.  Do not drive for 24 hours if you were given a medicine to help you relax (sedative). General instructions  Take over-the-counter and prescription medicines only as told by your health care provider.  Keep all follow-up visits as told by your health care provider. This is important. Contact a health care provider if you have:  A fever or chills.  You have redness, swelling, or pain around your insertion site. Get help right away if:  The catheter insertion area swells very fast.  You pass out.  You suddenly start to sweat or your skin gets clammy.  The catheter insertion area is bleeding, and the bleeding does not stop when you hold steady pressure on the area.  The area near or just beyond the catheter insertion site becomes pale, cool, tingly, or numb. These symptoms may represent a serious problem that is an emergency. Do not wait to see if the symptoms will go away. Get medical help right away. Call your local emergency services (911 in the [place] Do not drive yourself to the hospital. Summary  After the procedure, it is common to have bruising that usually fades within 1-2 weeks.  Check your femoral site every day for signs of infection.  Do not lift anything that is heavier than 10 lb (4.5 kg), or the   limit that you are told, until your health care provider says that it is safe. This information is not intended to replace advice given to you by your health care provider. Make sure you discuss any questions you have with your health care provider. Document Revised: 11/25/2017 Document Reviewed: 11/25/2017 Elsevier Patient Education  2020 Elsevier Inc.  

## 2020-05-24 ENCOUNTER — Encounter (HOSPITAL_COMMUNITY): Payer: Self-pay | Admitting: Cardiovascular Disease

## 2020-05-27 ENCOUNTER — Telehealth: Payer: Self-pay | Admitting: *Deleted

## 2020-05-27 NOTE — Telephone Encounter (Addendum)
Patient scheduled to see Dr Allyson Sabal on 7/6 and have follow up dopplers 7/9 Spoke with patient and cancelled 7/6 visit and advised to keep 7/23 visit as scheduled  Patient stated he was doing well

## 2020-05-31 ENCOUNTER — Ambulatory Visit: Payer: BC Managed Care – PPO | Admitting: Cardiovascular Disease

## 2020-06-03 ENCOUNTER — Ambulatory Visit (HOSPITAL_COMMUNITY)
Admission: RE | Admit: 2020-06-03 | Discharge: 2020-06-03 | Disposition: A | Discharge status: Home / Self Care | Payer: BC Managed Care – PPO | Source: Ambulatory Visit | Attending: Cardiovascular Disease | Admitting: Cardiovascular Disease

## 2020-06-03 ENCOUNTER — Other Ambulatory Visit: Payer: Self-pay

## 2020-06-03 ENCOUNTER — Other Ambulatory Visit (HOSPITAL_COMMUNITY): Payer: Self-pay | Admitting: Cardiovascular Disease

## 2020-06-03 DIAGNOSIS — I739 Peripheral vascular disease, unspecified: Secondary | ICD-10-CM

## 2020-06-03 DIAGNOSIS — Z95820 Peripheral vascular angioplasty status with implants and grafts: Secondary | ICD-10-CM

## 2020-06-08 ENCOUNTER — Other Ambulatory Visit: Payer: Self-pay

## 2020-06-17 ENCOUNTER — Ambulatory Visit (INDEPENDENT_AMBULATORY_CARE_PROVIDER_SITE_OTHER): Payer: BC Managed Care – PPO | Admitting: Cardiovascular Disease

## 2020-06-17 ENCOUNTER — Other Ambulatory Visit: Payer: Self-pay

## 2020-06-17 ENCOUNTER — Encounter: Payer: Self-pay | Admitting: Cardiovascular Disease

## 2020-06-17 VITALS — BP 150/94 | HR 56 | Temp 96.6°F | Resp 16 | Ht 70.0 in | Wt 203.2 lb

## 2020-06-17 DIAGNOSIS — E785 Hyperlipidemia, unspecified: Secondary | ICD-10-CM | POA: Diagnosis not present

## 2020-06-17 DIAGNOSIS — I739 Peripheral vascular disease, unspecified: Secondary | ICD-10-CM

## 2020-06-17 NOTE — Assessment & Plan Note (Signed)
History of dyslipidemia on Praluent as well as atorvastatin with lipid profile performed 05/06/2020 revealing a total cholesterol 75, LDL of 16 and HDL 46.

## 2020-06-17 NOTE — Patient Instructions (Signed)
Medication Instructions:  NO CHANGE *If you need a refill on your cardiac medications before your next appointment, please call your pharmacy*   Lab Work: If you have labs (blood work) drawn today and your tests are completely normal, you will receive your results only by: Marland Kitchen MyChart Message (if you have MyChart) OR . A paper copy in the mail If you have any lab test that is abnormal or we need to change your treatment, we will call you to review the results.   Testing/Procedures:  Your physician has requested that you have a lower extremity arterial duplex. This test is an ultrasound of the arteries in the legs or arms. It looks at arterial blood flow in the legs. Allow one hour for Lower and Upper Arterial scans. There are no restrictions or special instructions SCHEDULE IN 6 MONTHS   Follow-Up: At [facility], you and your health needs are our priority.  As part of our continuing mission to provide you with exceptional heart care, we have created designated Provider Care Teams.  These Care Teams include your primary Cardiologist (physician) and Advanced Practice Providers (APPs -  Physician Assistants and Nurse Practitioners) who all work together to provide you with the care you need, when you need it.  We recommend signing up for the patient portal called "MyChart".  Sign up information is provided on this After Visit Summary.  MyChart is used to connect with patients for Virtual Visits (Telemedicine).  Patients are able to view lab/test results, encounter notes, upcoming appointments, etc.  Non-urgent messages can be sent to your provider as well.   To learn more about what you can do with MyChart, go to [email or web address].    Your next appointment:   6 month(s)  The format for your next appointment:   In Person  Provider:   You may see Nanetta Batty MD or one of the following Advanced Practice Providers on your designated Care Team:    Corine Shelter, PA-C  [place], [place]  Edd Fabian, [place]

## 2020-06-17 NOTE — Assessment & Plan Note (Signed)
History of peripheral arterial disease status post bilateral SFA orbital atherectomy, drug-coated balloon angioplasty and stenting.  I performed angiography on him 02/18/2020 revealing 80% diffuse in-stent restenosis within the previously placed left SFA SUPERA stent placed 2 years previous to that.  I atherectomized and performed DCB of his highly calcified subtotally occluded right SFA stenosis with marked improvement in his symptoms and normalization of his Dopplers.  I performed laser atherectomy of his left SFA "in-stent restenosis" along with drug-coated balloon angioplasty 05/15/2020 with an excellent angiographic result.  His Dopplers normalized and his claudication has resolved.  He is on aspirin Plavix.

## 2020-06-17 NOTE — Assessment & Plan Note (Signed)
>>  ASSESSMENT AND PLAN FOR DYSLIPIDEMIA WRITTEN ON 06/17/2020  9:49 AM BY BERRY, Delton See, MD  History of dyslipidemia on Praluent as well as atorvastatin with lipid profile performed 05/06/2020 revealing a total cholesterol 75, LDL of 16 and HDL 46.

## 2020-06-17 NOTE — Progress Notes (Signed)
06/17/2020 Steven Ford   Feb 09, 1961  [id number]  Primary Physician Patient, No Pcp Per Primary Cardiologist: Runell Gess MD Nicholes Calamity, [place]  HPI:  Steven Ford is a 59 y.o.   married, father of 2 boys, who referred to our cardiology clinic due to abnormal ABI.  I last saw him in the office 04/29/2020.  He has Hx orf premature CV disease: CAD s/o CABG 2002 and L. SFA intervention/stent placement 2019. He was supposed to have a rt knee surgery, and was seen by his cardiologist for pre-op eval. Lower extremities US showed decreased right ABI and he was referred to Dr. Allyson Sabal for evaluation for intervenention. 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 He denies any claudication. No C.P, no DOE, no palpitation. Of note, recent echo 12/21/2019 shower decreased EF of 40%, grade 2DD, with global hypokinesia. Stress Myoview did not show evidence of ischemia.  I performed peripheral angiography on him 02/18/2020 revealing 80% diffuse in-stent restenosis within the previously placed left SFA stent 2 years ago.  I atherectomized and performed drug coated self-expanding stenting of 99% diffuse calcified right SFA stenosis with marked improvement in his symptoms and normalization of his Doppler studies.  He noticed sudden onset of numbness in his left foot approximately 2 months ago similar to his preintervention symptoms.  Based on this we decided to proceed with outpatient diagnostic peripheral angiography and endovascular therapy which was performed on 05/23/2020.  I performed laser atherectomy followed by drug-coated balloon angioplasty of high-grade left SFA "in-stent restenosis" with an excellent result.  He had 0% residual stenosis.  Dopplers normalized and his claudication resolved.  Interestingly, he was placed on Praluent as well and his total cholesterol came down to 75 and LDL 16.  He denies chest pain or shortness of breath.  He remains  on dual antiplatelet therapy.   Current Meds  Medication Sig   Alirocumab (PRALUENT) 150 MG/ML SOAJ Inject 150 mg into the skin every 14 (fourteen) days.   aspirin EC 81 MG tablet Take 81 mg by mouth daily.   atorvastatin (LIPITOR) 40 MG tablet Take 1 tablet (40 mg total) by mouth daily at 6 PM. (Patient taking differently: Take 40 mg by mouth daily. )   clobetasol ointment (TEMOVATE) 0.05 % Apply 1 application topically 2 (two) times daily as needed (skin irritation/rash).    clopidogrel (PLAVIX) 75 MG tablet Take 1 tablet (75 mg total) by mouth daily with breakfast.   lisinopril (ZESTRIL) 10 MG tablet Take 1 tablet (10 mg total) by mouth daily.   meloxicam (MOBIC) 15 MG tablet Take 15 mg by mouth daily as needed (pain/inflammation.).    metoprolol succinate (TOPROL-XL) 25 MG 24 hr tablet Take 1 tablet (25 mg total) by mouth daily for 15 days.   Multiple Vitamins-Minerals (MULTIVITAMIN WITH MINERALS) tablet Take 1 tablet by mouth daily.   tacrolimus (PROTOPIC) 0.1 % ointment Apply 1 application topically 2 (two) times daily as needed (skin irritation (hands)).    tadalafil (CIALIS) 20 MG tablet Take 1 tablet (20 mg total) by mouth daily as needed for erectile dysfunction.   Current Facility-Administered Medications for the 06/17/20 encounter (Office Visit) with Runell Gess, MD  Medication   sodium chloride flush (NS) 0.9 % injection 3 mL     Allergies  Allergen Reactions   Influenza Vaccines Other (See Comments)    Pt gets sick or sicker with flu shot  Social History   Socioeconomic History   Marital status: Married    Spouse name: Not on file   Number of children: Not on file   Years of education: Not on file   Highest education level: Not on file  Occupational History   Not on file  Tobacco Use   Smoking status: Light Tobacco Smoker    Years: 5.00    Types: Cigars   Smokeless tobacco: Never Used   Tobacco comment: 09/04/2018 "q time I play  golf"  Vaping Use   Vaping Use: Never used  Substance and Sexual Activity   Alcohol use: Yes    Alcohol/week: 2.0 standard drinks    Types: 2 Cans of beer per week   Drug use: No   Sexual activity: Yes  Other Topics Concern   Not on file  Social History Narrative   Not on file   Social Determinants of Health   Financial Resource Strain:    Difficulty of Paying Living Expenses:   Food Insecurity:    Worried About Programme researcher, broadcasting/film/video in the Last Year:    Barista in the Last Year:   Transportation Needs:    Freight forwarder (Medical):    Lack of Transportation (Non-Medical):   Physical Activity:    Days of Exercise per Week:    Minutes of Exercise per Session:   Stress:    Feeling of Stress :   Social Connections:    Frequency of Communication with Friends and Family:    Frequency of Social Gatherings with Friends and Family:    Attends Religious Services:    Active Member of Clubs or Organizations:    Attends Engineer, structural:    Marital Status:   Intimate Partner Violence:    Fear of Current or Ex-Partner:    Emotionally Abused:    Physically Abused:    Sexually Abused:      Review of Systems: General: negative for chills, fever, night sweats or weight changes.  Cardiovascular: negative for chest pain, dyspnea on exertion, edema, orthopnea, palpitations, paroxysmal nocturnal dyspnea or shortness of breath Dermatological: negative for rash Respiratory: negative for cough or wheezing Urologic: negative for hematuria Abdominal: negative for nausea, vomiting, diarrhea, bright red blood per rectum, melena, or hematemesis Neurologic: negative for visual changes, syncope, or dizziness All other systems reviewed and are otherwise negative except as noted above.    Blood pressure (!) 150/94, pulse 56, temperature (!) 96.6 F (35.9 C), resp. rate 16, height 5\' 10"  (1.778 m), weight (!) 203 lb 3.2 oz (92.2 kg).  General  appearance: alert and no distress Neck: no adenopathy, no carotid bruit, no JVD, supple, symmetrical, trachea midline and thyroid not enlarged, symmetric, no tenderness/mass/nodules Lungs: clear to auscultation bilaterally Heart: regular rate and rhythm, S1, S2 normal, no murmur, click, rub or gallop Extremities: extremities normal, atraumatic, no cyanosis or edema Pulses: 2+ and symmetric Skin: Skin color, texture, turgor normal. No rashes or lesions Neurologic: Alert and oriented X 3, normal strength and tone. Normal symmetric reflexes. Normal coordination and gait  EKG sinus bradycardia at 56 with evidence of LVH with repolarization changes.  I personally reviewed this EKG.  ASSESSMENT AND PLAN:   Dyslipidemia History of dyslipidemia on Praluent as well as atorvastatin with lipid profile performed 05/06/2020 revealing a total cholesterol 75, LDL of 16 and HDL 46.  Claudication in peripheral vascular disease (HCC) History of peripheral arterial disease status post bilateral SFA orbital atherectomy, drug-coated balloon  angioplasty and stenting.  I performed angiography on him 02/18/2020 revealing 80% diffuse in-stent restenosis within the previously placed left SFA SUPERA stent placed 2 years previous to that.  I atherectomized and performed DCB of his highly calcified subtotally occluded right SFA stenosis with marked improvement in his symptoms and normalization of his Dopplers.  I performed laser atherectomy of his left SFA "in-stent restenosis" along with drug-coated balloon angioplasty 05/15/2020 with an excellent angiographic result.  His Dopplers normalized and his claudication has resolved.  He is on aspirin Plavix.      Runell Gess MD FACP,FACC,FAHA, [facility] 06/17/2020 9:51 AM

## 2020-09-03 ENCOUNTER — Other Ambulatory Visit: Payer: Self-pay | Admitting: Cardiology

## 2020-09-08 ENCOUNTER — Other Ambulatory Visit: Payer: Self-pay | Admitting: Cardiology

## 2020-10-01 ENCOUNTER — Other Ambulatory Visit: Payer: Self-pay | Admitting: Cardiology

## 2020-10-03 ENCOUNTER — Other Ambulatory Visit: Payer: Self-pay | Admitting: Cardiology

## 2020-10-03 MED ORDER — LISINOPRIL 10 MG PO TABS: 10.0000 mg | ORAL_TABLET | Freq: Every day | ORAL | 0 refills | Status: DC

## 2020-10-03 NOTE — Telephone Encounter (Signed)
*  STAT* If patient is at the pharmacy, call can be transferred to refill team.   1. Which medications need to be refilled? (please list name of each medication and dose if known)  lisinopril (ZESTRIL) 10 MG tablet  2. Which pharmacy/location (including street and city if local pharmacy) is medication to be sent to? [id number] - [place], [place] - [address]  3. Do they need a 30 day or 90 day supply?  90 days    [facility] states that [facility] is saying that the [facility] - I do not see a denial from [place].

## 2020-10-03 NOTE — Telephone Encounter (Signed)
Refill sent in per request.  

## 2020-11-10 ENCOUNTER — Other Ambulatory Visit: Payer: Self-pay

## 2020-11-10 ENCOUNTER — Encounter: Payer: Self-pay | Admitting: Cardiology

## 2020-11-10 ENCOUNTER — Ambulatory Visit (INDEPENDENT_AMBULATORY_CARE_PROVIDER_SITE_OTHER): Payer: BC Managed Care – PPO | Admitting: Cardiology

## 2020-11-10 VITALS — BP 140/90 | HR 72 | Ht 70.0 in | Wt 203.0 lb

## 2020-11-10 DIAGNOSIS — I739 Peripheral vascular disease, unspecified: Secondary | ICD-10-CM

## 2020-11-10 DIAGNOSIS — Z951 Presence of aortocoronary bypass graft: Secondary | ICD-10-CM

## 2020-11-10 DIAGNOSIS — I2510 Atherosclerotic heart disease of native coronary artery without angina pectoris: Secondary | ICD-10-CM

## 2020-11-10 DIAGNOSIS — E785 Hyperlipidemia, unspecified: Secondary | ICD-10-CM

## 2020-11-10 NOTE — Progress Notes (Signed)
Cardiology Office Note:    Date:  11/10/2020   ID:  Steven Ford, DOB 01-17-1961, MRN [id number]  PCP:  Patient, No Pcp Per  Cardiologist:  Gypsy Balsam, MD    Referring MD: No ref. provider found   Chief Complaint  Patient presents with  . Medication Refill  I am doing very well  History of Present Illness:    Steven Ford is a 59 y.o. 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  In 2002 he did have coronary artery bypass graft.  Also peripheral vascular disease with intervention in left superficial femoral artery done twice.  Also history of hypertension as well as hyperlipidemia.  He comes today to my office for follow-up overall he is doing great, he is asymptomatic there is no chest pain tightness squeezing pressure burning chest.  He exercised on the regular basis have no difficulty doing this.  He is very happy with care that he get at our team for his peripheral vascular disease.  Past Medical History:  Diagnosis Date  . Coronary artery disease   . High cholesterol   . Hyperlipidemia   . Hypertension   . Migraine    "none in a long long while" (09/04/2018)  . PVD (peripheral vascular disease) (HCC)     Past Surgical History:  Procedure Laterality Date  . ABDOMINAL AORTOGRAM W/LOWER EXTREMITY  02/18/2020   ABDOMINAL AORTOGRAM W/LOWER EXTREMITY   . ABDOMINAL AORTOGRAM W/LOWER EXTREMITY Right 02/18/2020   Procedure: ABDOMINAL AORTOGRAM W/LOWER EXTREMITY;  Surgeon: Runell Gess, MD;  Location: [facility] INVASIVE CV LAB;  Service: Cardiovascular;  Laterality: Right;  . ABDOMINAL AORTOGRAM W/LOWER EXTREMITY Right 05/23/2020   Procedure: ABDOMINAL AORTOGRAM W/LOWER EXTREMITY;  Surgeon: Runell Gess, MD;  Location: [facility] INVASIVE CV LAB;  Service: Cardiovascular;  Laterality: Right;  . CORONARY ARTERY BYPASS GRAFT  2002   "triple"  . KNEE ARTHROSCOPY Right 2019  . LOWER EXTREMITY ANGIOGRAPHY N/A 08/28/2018    Procedure: LOWER EXTREMITY ANGIOGRAPHY;  Surgeon: Runell Gess, MD;  Location: [facility] INVASIVE CV LAB;  Service: Cardiovascular;  Laterality: N/A;  . LOWER EXTREMITY ANGIOGRAPHY N/A 09/04/2018   Procedure: LOWER EXTREMITY ANGIOGRAPHY;  Surgeon: Runell Gess, MD;  Location: [facility] INVASIVE CV LAB;  Service: Cardiovascular;  Laterality: N/A;  . PERIPHERAL VASCULAR ATHERECTOMY  02/18/2020   Procedure: PERIPHERAL VASCULAR ATHERECTOMY;  Surgeon: Runell Gess, MD;  Location: [facility] INVASIVE CV LAB;  Service: Cardiovascular;;  rt SFA  . PERIPHERAL VASCULAR ATHERECTOMY  05/23/2020   Procedure: PERIPHERAL VASCULAR ATHERECTOMY;  Surgeon: Runell Gess, MD;  Location: [facility] INVASIVE CV LAB;  Service: Cardiovascular;;  Laser-Left SFA  . PERIPHERAL VASCULAR BALLOON ANGIOPLASTY  05/23/2020   Procedure: PERIPHERAL VASCULAR BALLOON ANGIOPLASTY;  Surgeon: Runell Gess, MD;  Location: [facility] INVASIVE CV LAB;  Service: Cardiovascular;;  Left SFA  . PERIPHERAL VASCULAR INTERVENTION Left 09/04/2018  . PERIPHERAL VASCULAR INTERVENTION Left 09/04/2018   Procedure: PERIPHERAL VASCULAR INTERVENTION;  Surgeon: Runell Gess, MD;  Location: [facility] INVASIVE CV LAB;  Service: Cardiovascular;  Laterality: Left;  . PERIPHERAL VASCULAR INTERVENTION  02/18/2020   Procedure: PERIPHERAL VASCULAR INTERVENTION;  Surgeon: Runell Gess, MD;  Location: [facility] INVASIVE CV LAB;  Service: Cardiovascular;;  rt SFA    Current Medications: Current Meds  Medication Sig  . aspirin EC 81 MG tablet Take 81 mg by mouth daily.  Marland Kitchen atorvastatin (LIPITOR) 40 MG tablet Take 1 tablet (40 mg total) by mouth daily at  6 PM. (Patient taking differently: Take 40 mg by mouth daily.)  . clobetasol ointment (TEMOVATE) 0.05 % Apply 1 application topically 2 (two) times daily as needed (skin irritation/rash).   . clopidogrel (PLAVIX) 75 MG tablet Take 1 tablet (75 mg total) by mouth daily with breakfast.  . lisinopril (ZESTRIL) 10 MG tablet Take 1 tablet  (10 mg total) by mouth daily. Please schedule an office visit for further refills  . meloxicam (MOBIC) 15 MG tablet Take 15 mg by mouth daily as needed (pain/inflammation.).   Marland Kitchen metoprolol succinate (TOPROL-XL) 25 MG 24 hr tablet Take 1 tablet (25 mg total) by mouth daily for 15 days.  . Multiple Vitamins-Minerals (MULTIVITAMIN WITH MINERALS) tablet Take 1 tablet by mouth daily.  . tacrolimus (PROTOPIC) 0.1 % ointment Apply 1 application topically 2 (two) times daily as needed (skin irritation (hands)).   Marland Kitchen tadalafil (CIALIS) 20 MG tablet TAKE 1 TABLET BY MOUTH EVERY DAY AS NEEDED FOR ERECTILE DYSFUNCTION. PA DENIED   Current Facility-Administered Medications for the 11/10/20 encounter (Office Visit) with Georgeanna Lea, MD  Medication  . sodium chloride flush (NS) 0.9 % injection 3 mL     Allergies:   Influenza vaccines   Social History   Socioeconomic History  . Marital status: Married    Spouse name: Not on file  . Number of children: Not on file  . Years of education: Not on file  . Highest education level: Not on file  Occupational History  . Not on file  Tobacco Use  . Smoking status: Light Tobacco Smoker    Years: 5.00    Types: Cigars  . Smokeless tobacco: Never Used  . Tobacco comment: 09/04/2018 "q time I play golf"  Vaping Use  . Vaping Use: Never used  Substance and Sexual Activity  . Alcohol use: Yes    Alcohol/week: 2.0 standard drinks    Types: 2 Cans of beer per week  . Drug use: No  . Sexual activity: Yes  Other Topics Concern  . Not on file  Social History Narrative  . Not on file   Social Determinants of Health   Financial Resource Strain: Not on file  Food Insecurity: Not on file  Transportation Needs: Not on file  Physical Activity: Not on file  Stress: Not on file  Social Connections: Not on file     Family History: The patient's family history includes Anemia in his father; Heart disease in his mother. ROS:   Please see the history  of present illness.    All 14 point review of systems negative except as described per history of present illness  EKGs/Labs/Other Studies Reviewed:      Recent Labs: 05/06/2020: ALT 27; BUN 22; Creatinine, Ser 0.92; Hemoglobin 14.9; Platelets 273; Potassium 4.4; Sodium 142; TSH 1.670  Recent Lipid Panel    Component Value Date/Time   CHOL 75 (L) 05/06/2020 0853   TRIG 48 05/06/2020 0853   HDL 46 05/06/2020 0853   CHOLHDL 1.6 05/06/2020 0853   LDLCALC 16 05/06/2020 0853    Physical Exam:    VS:  BP 140/90 (BP Location: Left Arm, Patient Position: Sitting)   Pulse 72   Ht 5\' 10"  (1.778 m)   Wt 203 lb (92.1 kg)   SpO2 91%   BMI 29.13 kg/m     Wt Readings from Last 3 Encounters:  11/10/20 203 lb (92.1 kg)  06/17/20 (!) 203 lb 3.2 oz (92.2 kg)  05/23/20 200 lb (90.7 kg)  GEN:  Well nourished, well developed in no acute distress HEENT: Normal NECK: No JVD; No carotid bruits LYMPHATICS: No lymphadenopathy CARDIAC: RRR, no murmurs, no rubs, no gallops RESPIRATORY:  Clear to auscultation without rales, wheezing or rhonchi  ABDOMEN: Soft, non-tender, non-distended MUSCULOSKELETAL:  No edema; No deformity  SKIN: Warm and dry LOWER EXTREMITIES: no swelling NEUROLOGIC:  Alert and oriented x 3 PSYCHIATRIC:  Normal affect   ASSESSMENT:    1. Coronary artery disease involving native coronary artery of native heart without angina pectoris   2. Status post coronary artery bypass graft   3. Dyslipidemia   4. Claudication in peripheral vascular disease (HCC)    PLAN:    In order of problems listed above:  1. Premature coronary artery disease.  He is on dual antiplatelets therapy as well as aggressive lipid-lowering therapy and I advised him to continue with this for at least a year after intervention the fact that his LDL is very low is fine especially since he is doing well overall the fungus is LDL is very low it is fine especially since he is doing well overall especially  with the fact that he did have intervention for his SFA and shortly after that he required rather reintervention. Therefore, we will continue present management. 2. Dyslipidemia did review his fasting profile his LDL is 16 HDL 46.  Plan as outlined above. 3. Advanced peripheral vascular disease with advanced coronary artery disease.  Is one area we have not checked yet which I will do.  I will schedule him to have carotid ultrasound.  I will make sure we do not have any significant obstruction no bruit on the physical examination but with his advanced peripheral vascular disease that is the area need to be investigated.   Medication Adjustments/Labs and Tests Ordered: Current medicines are reviewed at length with the patient today.  Concerns regarding medicines are outlined above.  No orders of the defined types were placed in this encounter.  Medication changes: No orders of the defined types were placed in this encounter.   Signed, Georgeanna Lea, MD, [facility] 11/10/2020 2:29 PM    [facility]

## 2020-11-10 NOTE — Addendum Note (Signed)
Addended by: Hazle Quant on: 11/10/2020 02:47 PM   Modules accepted: Orders

## 2020-11-10 NOTE — Patient Instructions (Signed)
Medication Instructions:  Your physician recommends that you continue on your current medications as directed. Please refer to the Current Medication list given to you today.  *If you need a refill on your cardiac medications before your next appointment, please call your pharmacy*   Lab Work: None If you have labs (blood work) drawn today and your tests are completely normal, you will receive your results only by: MyChart Message (if you have MyChart) OR A paper copy in the mail If you have any lab test that is abnormal or we need to change your treatment, we will call you to review the results.   Testing/Procedures: Your physician has requested that you have a carotid duplex. This test is an ultrasound of the carotid arteries in your neck. It looks at blood flow through these arteries that supply the brain with blood. Allow one hour for this exam. There are no restrictions or special instructions.    Follow-Up: At [facility], you and your health needs are our priority.  As part of our continuing mission to provide you with exceptional heart care, we have created designated Provider Care Teams.  These Care Teams include your primary Cardiologist (physician) and Advanced Practice Providers (APPs -  Physician Assistants and Nurse Practitioners) who all work together to provide you with the care you need, when you need it.  We recommend signing up for the patient portal called "MyChart".  Sign up information is provided on this After Visit Summary.  MyChart is used to connect with patients for Virtual Visits (Telemedicine).  Patients are able to view lab/test results, encounter notes, upcoming appointments, etc.  Non-urgent messages can be sent to your provider as well.   To learn more about what you can do with MyChart, go to [email or web address].    Your next appointment:   5 month(s)  The format for your next appointment:   In Person  Provider:   Robert Krasowski, MD   Other  Instructions   

## 2020-11-11 ENCOUNTER — Telehealth: Payer: Self-pay | Admitting: Cardiology

## 2020-11-11 MED ORDER — LISINOPRIL 10 MG PO TABS: 10.0000 mg | ORAL_TABLET | Freq: Every day | ORAL | 2 refills | Status: DC

## 2020-11-11 NOTE — Telephone Encounter (Signed)
*  STAT* If patient is at the pharmacy, call can be transferred to refill team.   1. Which medications need to be refilled? (please list name of each medication and dose if known) lisinopril (ZESTRIL) 10 MG tablet  2. Which pharmacy/location (including street and city if local pharmacy) is medication to be sent to? [id number] - [place], [place] - [address]  3. Do they need a 30 day or 90 day supply? 30 day   Patient has been out of medication for a few weeks.

## 2020-11-11 NOTE — Telephone Encounter (Signed)
Rx sent to the pharmacy.

## 2020-12-16 ENCOUNTER — Other Ambulatory Visit: Payer: Self-pay

## 2020-12-16 ENCOUNTER — Ambulatory Visit (INDEPENDENT_AMBULATORY_CARE_PROVIDER_SITE_OTHER): Payer: BC Managed Care – PPO

## 2020-12-16 DIAGNOSIS — I2510 Atherosclerotic heart disease of native coronary artery without angina pectoris: Secondary | ICD-10-CM | POA: Diagnosis not present

## 2020-12-16 NOTE — Progress Notes (Addendum)
Carotid duplex exam performed  [name] Ryen Heitmeyer RDCS, RVT 

## 2021-01-03 ENCOUNTER — Other Ambulatory Visit: Payer: Self-pay

## 2021-01-03 ENCOUNTER — Ambulatory Visit (INDEPENDENT_AMBULATORY_CARE_PROVIDER_SITE_OTHER): Payer: BC Managed Care – PPO | Admitting: Cardiovascular Disease

## 2021-01-03 ENCOUNTER — Encounter: Payer: Self-pay | Admitting: Cardiovascular Disease

## 2021-01-03 VITALS — BP 132/78 | HR 74 | Ht 70.5 in | Wt 200.8 lb

## 2021-01-03 DIAGNOSIS — I739 Peripheral vascular disease, unspecified: Secondary | ICD-10-CM | POA: Diagnosis not present

## 2021-01-03 NOTE — Assessment & Plan Note (Signed)
Mr. Senne returns for follow-up.  I performed orbital atherectomy, stenting of his left SFA in 2019.  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  I then performed laser arthrectomy, PTA of his left SFA in-stent restenosis 05/23/2020.  His most recent lower extremity arterial Dopplers performed 06/03/2020 revealed ABIs around one bilaterally with a widely patent left SFA stent.  He really denies claudication.  His major complaint is of left inguinal discomfort which is somewhat positional, worse at night.  This does not sound vascular.  On exam he has no mass or bruit and he has got an excellent femoral pulse.

## 2021-01-03 NOTE — Progress Notes (Signed)
01/03/2021 Makoto Sellitto Rosenberger   [birth year]  [id number]  Primary Physician Patient, No Pcp Per Primary Cardiologist: Runell Gess MD Nicholes Calamity, [place]  HPI:  BRANDIN STETZER is a 60 y.o.  married, father of 2 boys, who referred to our cardiology clinic due to abnormal ABI.I last saw him in the office  06/17/2020. He has Hx orf premature CV disease: CAD s/o CABG 2002 and L. SFA intervention/stent placement 2019. He was supposed to have a rt knee surgery, and was seen by his cardiologist for pre-op eval. Lower extremities US showed decreased right ABI and he was referred to Dr. Allyson Sabal for evaluation for intervenention. 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 He denies any claudication. No C.P, no DOE, no palpitation. Of note, recent echo 12/21/2019 shower decreased EF of 40%, grade 2DD, with global hypokinesia. Stress Myoview did not show evidence of ischemia.  I performed peripheral angiography on him 02/18/2020 revealing 80% diffuse in-stent restenosis within the previously placed left SFA stent 2 years ago. I atherectomized and performed drug coated self-expanding stenting of 99% diffuse calcified right SFA stenosis with marked improvement in his symptoms and normalization of his Doppler studies. He noticed sudden onset of numbness in his left foot approximately 2 months ago similar to his preintervention symptoms.  Based on this we decided to proceed with outpatient diagnostic peripheral angiography and endovascular therapy which was performed on 05/23/2020.  I performed laser atherectomy followed by drug-coated balloon angioplasty of high-grade left SFA "in-stent restenosis" with an excellent result.  He had 0% residual stenosis.  Dopplers normalized and his claudication resolved.  Interestingly, he was placed on Praluent as well and his total cholesterol came down to 75 and LDL 16.  Since I saw him 6 months ago he continued continues to  do well.  He denies chest pain, shortness of breath or claudication.  His major complaints are of left inguinal pain which is somewhat positional, worse at night when he is lying down.  This does not sound vascular.    Current Meds  Medication Sig  . aspirin EC 81 MG tablet Take 81 mg by mouth daily.  Marland Kitchen atorvastatin (LIPITOR) 40 MG tablet Take 1 tablet (40 mg total) by mouth daily at 6 PM. (Patient taking differently: Take 40 mg by mouth daily.)  . clobetasol ointment (TEMOVATE) 0.05 % Apply 1 application topically 2 (two) times daily as needed (skin irritation/rash).   . clopidogrel (PLAVIX) 75 MG tablet Take 1 tablet (75 mg total) by mouth daily with breakfast.  . lisinopril (ZESTRIL) 10 MG tablet Take 1 tablet (10 mg total) by mouth daily. Please schedule an office visit for further refills  . metoprolol succinate (TOPROL-XL) 25 MG 24 hr tablet Take 1 tablet (25 mg total) by mouth daily for 15 days.  . Multiple Vitamins-Minerals (MULTIVITAMIN WITH MINERALS) tablet Take 1 tablet by mouth daily.  . tacrolimus (PROTOPIC) 0.1 % ointment Apply 1 application topically 2 (two) times daily as needed (skin irritation (hands)).   Marland Kitchen tadalafil (CIALIS) 20 MG tablet TAKE 1 TABLET BY MOUTH EVERY DAY AS NEEDED FOR ERECTILE DYSFUNCTION. PA DENIED  . [DISCONTINUED] meloxicam (MOBIC) 15 MG tablet Take 15 mg by mouth daily as needed (pain/inflammation.).    Current Facility-Administered Medications for the 01/03/21 encounter (Office Visit) with Runell Gess, MD  Medication  . sodium chloride flush (NS) 0.9 % injection 3 mL  Allergies  Allergen Reactions  . Influenza Vaccines Other (See Comments)    Pt gets sick or sicker with flu shot     Social History   Socioeconomic History  . Marital status: Married    Spouse name: Not on file  . Number of children: Not on file  . Years of education: Not on file  . Highest education level: Not on file  Occupational History  . Not on file  Tobacco Use   . Smoking status: Light Tobacco Smoker    Years: 5.00    Types: Cigars  . Smokeless tobacco: Never Used  . Tobacco comment: 09/04/2018 "q time I play golf"  Vaping Use  . Vaping Use: Never used  Substance and Sexual Activity  . Alcohol use: Yes    Alcohol/week: 2.0 standard drinks    Types: 2 Cans of beer per week  . Drug use: No  . Sexual activity: Yes  Other Topics Concern  . Not on file  Social History Narrative  . Not on file   Social Determinants of Health   Financial Resource Strain: Not on file  Food Insecurity: Not on file  Transportation Needs: Not on file  Physical Activity: Not on file  Stress: Not on file  Social Connections: Not on file  Intimate Partner Violence: Not on file     Review of Systems: General: negative for chills, fever, night sweats or weight changes.  Cardiovascular: negative for chest pain, dyspnea on exertion, edema, orthopnea, palpitations, paroxysmal nocturnal dyspnea or shortness of breath Dermatological: negative for rash Respiratory: negative for cough or wheezing Urologic: negative for hematuria Abdominal: negative for nausea, vomiting, diarrhea, bright red blood per rectum, melena, or hematemesis Neurologic: negative for visual changes, syncope, or dizziness All other systems reviewed and are otherwise negative except as noted above.    Blood pressure 132/78, pulse 74, height 5' 10.5" (1.791 m), weight 200 lb 12.8 oz (91.1 kg), SpO2 93 %.  General appearance: alert and no distress Neck: no adenopathy, no carotid bruit, no JVD, supple, symmetrical, trachea midline and thyroid not enlarged, symmetric, no tenderness/mass/nodules Lungs: clear to auscultation bilaterally Heart: regular rate and rhythm, S1, S2 normal, no murmur, click, rub or gallop Extremities: extremities normal, atraumatic, no cyanosis or edema Pulses: 2+ and symmetric Skin: Skin color, texture, turgor normal. No rashes or lesions Neurologic: Alert and oriented X  3, normal strength and tone. Normal symmetric reflexes. Normal coordination and gait  EKG not performed today  ASSESSMENT AND PLAN:   Claudication in peripheral vascular disease [facility]) Mr. Safi returns for follow-up.  I performed orbital atherectomy, stenting of his left SFA in 2019.  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  I then performed laser arthrectomy, PTA of his left SFA in-stent restenosis 05/23/2020.  His most recent lower extremity arterial Dopplers performed 06/03/2020 revealed ABIs around one bilaterally with a widely patent left SFA stent.  He really denies claudication.  His major complaint is of left inguinal discomfort which is somewhat positional, worse at night.  This does not sound vascular.  On exam he has no mass or bruit and he has got an excellent femoral pulse.      Runell Gess MD FACP,FACC,FAHA, [facility] 01/03/2021 2:20 PM

## 2021-01-03 NOTE — Patient Instructions (Signed)
Medication Instructions:  Your physician recommends that you continue on your current medications as directed. Please refer to the Current Medication list given to you today.  *If you need a refill on your cardiac medications before your next appointment, please call your pharmacy*   Testing/Procedures: Your physician has requested that you have a lower extremity arterial duplex. This test is an ultrasound of the arteries in the legs. It looks at arterial blood flow in the legs. Allow one hour for Lower Arterial scans. There are no restrictions or special instructions. This procedure is done at [address]. To be done in July of 2022.   Follow-Up: At [facility], you and your health needs are our priority.  As part of our continuing mission to provide you with exceptional heart care, we have created designated Provider Care Teams.  These Care Teams include your primary Cardiologist (physician) and Advanced Practice Providers (APPs -  Physician Assistants and Nurse Practitioners) who all work together to provide you with the care you need, when you need it.  We recommend signing up for the patient portal called "MyChart".  Sign up information is provided on this After Visit Summary.  MyChart is used to connect with patients for Virtual Visits (Telemedicine).  Patients are able to view lab/test results, encounter notes, upcoming appointments, etc.  Non-urgent messages can be sent to your provider as well.   To learn more about what you can do with MyChart, go to [email or web address].    Your next appointment:   12 month(s)  The format for your next appointment:   In Person  Provider:   Nanetta Batty, MD

## 2021-01-08 ENCOUNTER — Other Ambulatory Visit: Payer: Self-pay | Admitting: Cardiology

## 2021-01-09 NOTE — Telephone Encounter (Signed)
Rx refill sent to pharmacy. 

## 2021-02-23 ENCOUNTER — Telehealth: Payer: Self-pay | Admitting: Cardiology

## 2021-02-23 DIAGNOSIS — R002 Palpitations: Secondary | ICD-10-CM

## 2021-02-23 NOTE — Telephone Encounter (Signed)
Patient c/o Palpitations:  High priority if patient c/o lightheadedness, shortness of breath, or chest pain  1) How long have you had palpitations/irregular HR/ Afib? Are you having the symptoms now?  No not right now /  Pt stated that it happens off and on randomly for the last 2 weeks ,He stated his heart races and feels a little lightheaded   2) Are you currently experiencing lightheadedness, SOB or CP? During this episodes he feel lightlessness and heart racing   3) Do you have a history of afib (atrial fibrillation) or irregular heart rhythm? No   4) Have you checked your BP or HR? (document readings if available): pt is not sure,  he has not taken it    5) Are you experiencing any other symptoms? Pt denies any other symptoms     Best number [phone]

## 2021-02-23 NOTE — Telephone Encounter (Signed)
Left message for patient to return our call.

## 2021-02-23 NOTE — Telephone Encounter (Signed)
Called patient. He reports he has been having palpitations off and on for the past 2 weeks. During them he has been having dizziness and light headedness to the point where he feels like he is going to pass out. No pain or shortness of breath. He reports he still works out daily. He reports he has not been staying as hydrated as he should I encouraged him to do this. He understood. I informed him I will check with Dr. Bing Matter for further recommendations. He has never wore a heart monitor so I told him that may be the next step to be on the safe side. He understood. Will consult with Dr. Bing Matter.

## 2021-02-24 ENCOUNTER — Ambulatory Visit: Payer: BC Managed Care – PPO

## 2021-02-24 DIAGNOSIS — R002 Palpitations: Secondary | ICD-10-CM

## 2021-02-24 NOTE — Telephone Encounter (Signed)
Called patient informed him we will send monitor for him to wear for 7 days he understood no further questions.

## 2021-02-24 NOTE — Telephone Encounter (Signed)
Make arrangements for a Zio patch for 1 week because of palpitations please

## 2021-03-03 ENCOUNTER — Telehealth: Payer: Self-pay

## 2021-03-03 NOTE — Telephone Encounter (Signed)
Updated upcoming office visit note to include pre-op clearance.

## 2021-03-03 NOTE — Telephone Encounter (Signed)
Patient has follow-up with Dr. Bing Matter next month.  Call back pool to update the reason behind follow-up to include preop clearance.

## 2021-03-03 NOTE — Telephone Encounter (Signed)
   [facility] Pre-operative Risk Assessment    Patient Name: DAVYON FISCH  DOB: 04-22-1961  MRN: [id number]   HEARTCARE STAFF: - Please ensure there is not already an duplicate clearance open for this procedure. - Under Visit Info/Reason for Call, type in Other and utilize the format Clearance MM/DD/YY or Clearance TBD. Do not use dashes or single digits. - If request is for dental extraction, please clarify the # of teeth to be extracted.  Request for surgical clearance:  1. What type of surgery is being performed? Right total knee replacement    2. When is this surgery scheduled? TBD   3. What type of clearance is required (medical clearance vs. Pharmacy clearance to hold med vs. Both)? Both  4. Are there any medications that need to be held prior to surgery and how long? ASA and Plavis   5. Practice name and name of physician performing surgery? [facility], Dr. Elsie Saas   6. What is the office phone number? [place]   7.   What is the office fax number? [phone] Attn: Sherri G  8.   Anesthesia type (None, local, MAC, general) ? Not listed    Jacqulynn Cadet 03/03/2021, 11:08 AM  _________________________________________________________________   (provider comments below)

## 2021-03-07 ENCOUNTER — Encounter: Payer: Self-pay | Admitting: Nurse Practitioner

## 2021-03-07 ENCOUNTER — Other Ambulatory Visit: Payer: Self-pay

## 2021-03-07 ENCOUNTER — Ambulatory Visit (INDEPENDENT_AMBULATORY_CARE_PROVIDER_SITE_OTHER): Payer: BC Managed Care – PPO | Admitting: Nurse Practitioner

## 2021-03-07 VITALS — BP 127/75 | HR 63 | Temp 97.9°F | Ht 70.5 in | Wt 199.4 lb

## 2021-03-07 DIAGNOSIS — I2510 Atherosclerotic heart disease of native coronary artery without angina pectoris: Secondary | ICD-10-CM | POA: Diagnosis not present

## 2021-03-07 DIAGNOSIS — M1711 Unilateral primary osteoarthritis, right knee: Secondary | ICD-10-CM | POA: Insufficient documentation

## 2021-03-07 DIAGNOSIS — Z7689 Persons encountering health services in other specified circumstances: Secondary | ICD-10-CM

## 2021-03-07 HISTORY — DX: Unilateral primary osteoarthritis, right knee: M17.11

## 2021-03-07 HISTORY — DX: Persons encountering health services in other specified circumstances: Z76.89

## 2021-03-07 NOTE — Patient Instructions (Signed)
Familial Hypercholesterolemia Familial hypercholesterolemia (FH) is a genetic disorder that causes a very high level of LDL (low-density lipoprotein) cholesterol. Cholesterol is a waxy, fat-like substance that your body needs to build cells. Your body makes all the cholesterol it needs in the liver and removes extra (excess) cholesterol from the blood as needed. Excess cholesterol comes from food that you eat. In people who have FH, the body is not able to remove LDL cholesterol from the blood as it should. A high level of LDL cholesterol puts you at higher risk for narrowing and hardening of your arteries (atherosclerosis) at an early age. This raises your risk for heart disease and stroke. What are the causes? FH is passed from parent to child (inherited). FH is caused by an inherited gene defect (genetic mutation) that makes it hard for the liver to remove LDL cholesterol from the blood. The gene may be inherited from one parent or both parents. What increases the risk? You may be at higher risk for FH if:  You have a family history of the condition. If both parents carry the genetic mutation, their children are at higher risk for a more severe form of FH, with symptoms that start at an earlier age. What are the signs or symptoms? You may have a high level of LDL cholesterol before you develop symptoms. Symptoms of FH may include:  Cholesterol nodules (xanthomas) on the cords of tissue that connect muscles to bones (tendons). Xanthomas often form on the long tendon at the back of the ankle (Achilles tendon) or on the tendons on the back of the hands.  Cholesterol deposits (xanthelasmas) under the skin of the eyelids.  A gray or blue ring around the white part of the eye (corneal arcus). Complications of FH can occur due to atherosclerosis. Atherosclerosis may cause damage to an area of the body that is not getting enough blood. Complications of FH may include:  Chest pain (angina) and shortness  of breath due to narrowed or blocked arteries in the heart (coronary artery disease).  Pain and cramping in the back of the lower legs (calves) when walking (claudication).  Interruption in blood flow to the brain (stroke). This may cause: ? Loss of balance. ? Vision loss. ? Sudden weakness or numbness on one side of the body. How is this diagnosed? This condition may be diagnosed based on:  Your symptoms.  Your medical history, including any family history of FH or early coronary heart disease.  A physical exam.  A blood test to check for the genetic mutation that causes FH. Your family members may also be tested. How is this treated? There is no cure for FH, but treatment can lower LDL cholesterol levels and lower your risk for heart attack or stroke. Treatment should be started as soon as you are diagnosed. Treatment may include:  A type of medicine that lowers your cholesterol (statin). If statins do not help, your health care provider may try other kinds of cholesterol-lowering medicines. The exact combination of medicines depends on the severity of your symptoms.  A procedure to filter LDL from your blood (apheresis). You may need this treatment if you have a severe form of FH.  Making lifestyle changes that are healthy for your heart, such as lowering the amount of fat and cholesterol in your diet. Follow these instructions at home: Lifestyle  Lose weight, if directed by your health care provider.  Follow instructions from your health care provider about eating a healthy diet. Your   health care provider may recommend: ? Working with a diet and nutrition specialist (dietitian), who can help you make a healthy eating plan and help you maintain a healthy weight. ? Eating less fat and cholesterol. Avoid fatty meats, fried foods, and whole-fat dairy. ? Eating more vegetables, fruits, and whole grains. ? Limiting your intake of alcohol.  Be physically active. Ask your health care  provider what type of exercise is best for you.  Do not use any products that contain nicotine or tobacco, such as cigarettes and e-cigarettes. If you need help quitting, ask your health care provider.  Work with your health care provider to manage any other conditions you have, such as high blood pressure (hypertension) or diabetes. These conditions affect your heart.   General instructions  Take over-the-counter and prescription medicines only as told by your health care provider.  Keep all follow-up visits as told by your health care provider. This is important. Contact a health care provider if:  You have pain or cramps in your calf when you walk. Get help right away if:  You have sudden, unexplained discomfort in your chest, arms, back, neck, jaw, or upper body.  You have trouble breathing.  You have a sudden, severe headache with no known cause.  You have any symptoms of a stroke. "BE FAST" is an easy way to remember the main warning signs of a stroke: ? B - Balance. Signs are dizziness, sudden trouble walking, or loss of balance. ? E - Eyes. Signs are trouble seeing or a sudden change in vision. ? F - Face. Signs are sudden weakness or numbness of the face, or the face or eyelid drooping on one side. ? A - Arms. Signs are weakness or numbness in an arm. This happens suddenly and usually on one side of the body. ? S - Speech. Signs are sudden trouble speaking, slurred speech, or trouble understanding what people say. ? T - Time. Time to call emergency services. Write down what time symptoms started. These symptoms may represent a serious problem that is an emergency. Do not wait to see if the symptoms will go away. Get medical help right away. Call your local emergency services (911 in the [place] Do not drive yourself to the hospital.   Summary  Familial hypercholesterolemia (FH) is a genetic disorder that causes a very high level of LDL (low-density lipoprotein)  cholesterol.  FH increases your risk for coronary heart disease and stroke at an early age.  Treatment for FH should be started as soon as you are diagnosed with the condition. Treatment is aimed at lowering your risk for complications.  Follow instructions from your health care provider about eating a healthy diet. Your health care provider may recommend eating less fat and cholesterol. This information is not intended to replace advice given to you by your health care provider. Make sure you discuss any questions you have with your health care provider. Document Revised: 03/08/2020 Document Reviewed: 03/08/2020 Elsevier Patient Education  2021 [employer].

## 2021-03-07 NOTE — Progress Notes (Signed)
New Patient Office Visit  Subjective:  Patient ID: Steven Ford, male    DOB: Apr 14, 1961  Age: 60 y.o. MRN: [id number]  CC:  Chief Complaint  Patient presents with  . New Patient (Initial Visit)    HPI Steven Ford presents to establish care with new primary care provider. He has not seen a primary care provider in nearly 20 years. States that he does see his cardiologist, Dr. Gwen Pounds, faithfully, about every 3 to 6 months, depending on what is going on. 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 States that cardiology has been monitoring cholesterol and other labs and also frequently monitors heart function.  The patient states that he is getting ready to have right knee replacement. He states that he needed to have a primary care provider before the surgery could be scheduled.  The patient denies chest pain, chest pressure, shortness of breath, or palpitations at this time. His blood pressure is well managed. He states that he did have screening colonoscopy when he turned 50. Will be due again sometime this year. He denies nausea, vomiting, or changes in his bowel and bladder habits.   Past Medical History:  Diagnosis Date  . Coronary artery disease   . High cholesterol   . Hyperlipidemia   . Hypertension   . Migraine    "none in a long long while" (09/04/2018)  . PVD (peripheral vascular disease) (HCC)     Past Surgical History:  Procedure Laterality Date  . ABDOMINAL AORTOGRAM W/LOWER EXTREMITY  02/18/2020   ABDOMINAL AORTOGRAM W/LOWER EXTREMITY   . ABDOMINAL AORTOGRAM W/LOWER EXTREMITY Right 02/18/2020   Procedure: ABDOMINAL AORTOGRAM W/LOWER EXTREMITY;  Surgeon: Runell Gess, MD;  Location: [facility] INVASIVE CV LAB;  Service: Cardiovascular;  Laterality: Right;  . ABDOMINAL AORTOGRAM W/LOWER EXTREMITY Right 05/23/2020   Procedure: ABDOMINAL AORTOGRAM W/LOWER EXTREMITY;  Surgeon: Runell Gess, MD;  Location: [facility]  INVASIVE CV LAB;  Service: Cardiovascular;  Laterality: Right;  . CORONARY ARTERY BYPASS GRAFT  2002   "triple"  . KNEE ARTHROSCOPY Right 2019  . LOWER EXTREMITY ANGIOGRAPHY N/A 08/28/2018   Procedure: LOWER EXTREMITY ANGIOGRAPHY;  Surgeon: Runell Gess, MD;  Location: [facility] INVASIVE CV LAB;  Service: Cardiovascular;  Laterality: N/A;  . LOWER EXTREMITY ANGIOGRAPHY N/A 09/04/2018   Procedure: LOWER EXTREMITY ANGIOGRAPHY;  Surgeon: Runell Gess, MD;  Location: [facility] INVASIVE CV LAB;  Service: Cardiovascular;  Laterality: N/A;  . PERIPHERAL VASCULAR ATHERECTOMY  02/18/2020   Procedure: PERIPHERAL VASCULAR ATHERECTOMY;  Surgeon: Runell Gess, MD;  Location: [facility] INVASIVE CV LAB;  Service: Cardiovascular;;  rt SFA  . PERIPHERAL VASCULAR ATHERECTOMY  05/23/2020   Procedure: PERIPHERAL VASCULAR ATHERECTOMY;  Surgeon: Runell Gess, MD;  Location: [facility] INVASIVE CV LAB;  Service: Cardiovascular;;  Laser-Left SFA  . PERIPHERAL VASCULAR BALLOON ANGIOPLASTY  05/23/2020   Procedure: PERIPHERAL VASCULAR BALLOON ANGIOPLASTY;  Surgeon: Runell Gess, MD;  Location: [facility] INVASIVE CV LAB;  Service: Cardiovascular;;  Left SFA  . PERIPHERAL VASCULAR INTERVENTION Left 09/04/2018  . PERIPHERAL VASCULAR INTERVENTION Left 09/04/2018   Procedure: PERIPHERAL VASCULAR INTERVENTION;  Surgeon: Runell Gess, MD;  Location: [facility] INVASIVE CV LAB;  Service: Cardiovascular;  Laterality: Left;  . PERIPHERAL VASCULAR INTERVENTION  02/18/2020   Procedure: PERIPHERAL VASCULAR INTERVENTION;  Surgeon: Runell Gess, MD;  Location: [facility] INVASIVE CV LAB;  Service: Cardiovascular;;  rt SFA    Family History  Problem Relation Age of Onset  .  Heart disease Mother   . High blood pressure Mother   . Anemia Father     Social History   Socioeconomic History  . Marital status: Married    Spouse name: Not on file  . Number of children: Not on file  . Years of education: Not on file  . Highest education level: Not on  file  Occupational History  . Not on file  Tobacco Use  . Smoking status: Light Tobacco Smoker    Years: 5.00    Types: Cigars  . Smokeless tobacco: Never Used  . Tobacco comment: 09/04/2018 "q time I play golf"  Vaping Use  . Vaping Use: Never used  Substance and Sexual Activity  . Alcohol use: Yes    Alcohol/week: 2.0 standard drinks    Types: 2 Cans of beer per week  . Drug use: Yes  . Sexual activity: Yes    Partners: Female  Other Topics Concern  . Not on file  Social History Narrative  . Not on file   Social Determinants of Health   Financial Resource Strain: Not on file  Food Insecurity: Not on file  Transportation Needs: Not on file  Physical Activity: Not on file  Stress: Not on file  Social Connections: Not on file  Intimate Partner Violence: Not on file    ROS Review of Systems  Constitutional: Negative for activity change, chills and fever.  HENT: Negative for congestion, postnasal drip, rhinorrhea, sinus pressure, sinus pain and sore throat.   Eyes: Negative.   Respiratory: Negative for cough, chest tightness, shortness of breath and wheezing.   Cardiovascular: Negative for chest pain and palpitations.  Gastrointestinal: Negative for constipation, diarrhea, nausea and vomiting.  Endocrine: Negative.   Genitourinary: Negative.   Musculoskeletal: Positive for arthralgias. Negative for back pain and myalgias.       Chronic right knee pain.   Skin: Negative for rash.  Neurological: Negative for dizziness, weakness and headaches.  Hematological: Negative for adenopathy.  Psychiatric/Behavioral: The patient is not nervous/anxious.   All other systems reviewed and are negative.   Objective:   Today's Vitals   03/07/21 0902  BP: 127/75  Pulse: 63  Temp: 97.9 F (36.6 C)  SpO2: 94%  Weight: 199 lb 6.4 oz (90.4 kg)  Height: 5' 10.5" (1.791 m)   Body mass index is 28.21 kg/m.   Physical Exam Vitals and nursing note reviewed.  Constitutional:       General: He is not in acute distress.    Appearance: Normal appearance. He is well-developed. He is not diaphoretic.  HENT:     Head: Normocephalic and atraumatic.     Mouth/Throat:     Pharynx: No oropharyngeal exudate.  Eyes:     Pupils: Pupils are equal, round, and reactive to light.  Neck:     Thyroid: No thyromegaly.     Vascular: No carotid bruit or JVD.     Trachea: No tracheal deviation.  Cardiovascular:     Rate and Rhythm: Normal rate and regular rhythm.     Pulses: Normal pulses.     Heart sounds: Normal heart sounds. No murmur heard. No friction rub. No gallop.   Pulmonary:     Effort: Pulmonary effort is normal. No respiratory distress.     Breath sounds: Normal breath sounds. No wheezing or rales.  Chest:     Chest wall: No tenderness.  Abdominal:     Palpations: Abdomen is soft.  Musculoskeletal:  General: Normal range of motion.     Cervical back: Normal range of motion and neck supple.  Lymphadenopathy:     Cervical: No cervical adenopathy.  Skin:    General: Skin is warm and dry.     Capillary Refill: Capillary refill takes less than 2 seconds.  Neurological:     General: No focal deficit present.     Mental Status: He is alert and oriented to person, place, and time.     Cranial Nerves: No cranial nerve deficit.  Psychiatric:        Mood and Affect: Mood normal.        Behavior: Behavior normal.        Thought Content: Thought content normal.        Judgment: Judgment normal.     Assessment & Plan:  1. Encounter to establish care Appointment today to establish new primary care provider.   2. Coronary artery disease involving native coronary artery of native heart without angina pectoris Patient seen every three to six months per Dr. Gwen Pounds, his cardiologist.   3. Primary osteoarthritis of right knee Patient to have total knee replacement scheduled in new future.   Problem List Items Addressed This Visit      Cardiovascular and  Mediastinum   Coronary artery disease involving native coronary artery of native heart without angina pectoris     Musculoskeletal and Integument   Primary osteoarthritis of right knee     Other   Encounter to establish care - Primary      Outpatient Encounter Medications as of 03/07/2021  Medication Sig  . aspirin EC 81 MG tablet Take 81 mg by mouth daily.  Marland Kitchen atorvastatin (LIPITOR) 40 MG tablet Take 1 tablet (40 mg total) by mouth daily at 6 PM. (Patient taking differently: Take 40 mg by mouth daily.)  . clobetasol ointment (TEMOVATE) 0.05 % Apply 1 application topically 2 (two) times daily as needed (skin irritation/rash).   . clopidogrel (PLAVIX) 75 MG tablet TAKE 1 TABLET (75 MG TOTAL) BY MOUTH DAILY WITH BREAKFAST.  Marland Kitchen lisinopril (ZESTRIL) 10 MG tablet Take 1 tablet (10 mg total) by mouth daily. Please schedule an office visit for further refills  . metoprolol succinate (TOPROL-XL) 25 MG 24 hr tablet Take 1 tablet (25 mg total) by mouth daily for 15 days.  . Multiple Vitamins-Minerals (MULTIVITAMIN WITH MINERALS) tablet Take 1 tablet by mouth daily.  . tacrolimus (PROTOPIC) 0.1 % ointment Apply 1 application topically 2 (two) times daily as needed (skin irritation (hands)).   Marland Kitchen tadalafil (CIALIS) 20 MG tablet TAKE 1 TABLET BY MOUTH EVERY DAY AS NEEDED FOR ERECTILE DYSFUNCTION. PA DENIED   Facility-Administered Encounter Medications as of 03/07/2021  Medication  . sodium chloride flush (NS) 0.9 % injection 3 mL   Time spent with the patient was approximately 30 minutes. This time included reviewing progress notes, labs, imaging studies, and discussing plan for follow up.   Follow-up: Return in about 6 months (around 09/06/2021) for routine physical .   Carlean Jews, NP

## 2021-03-09 ENCOUNTER — Telehealth: Payer: Self-pay

## 2021-03-09 NOTE — Telephone Encounter (Signed)
[facility] aware clearance status will be determine on his next upcoming appt on 04/12/21

## 2021-03-10 ENCOUNTER — Telehealth: Payer: Self-pay | Admitting: *Deleted

## 2021-03-10 NOTE — Telephone Encounter (Signed)
Spoke with pt about monitor: it was returned with only 2 days worth of data on it. Pt stated he did not know how long he was supposed to wear it and apologized for taking it off too soon. Also stated that he has not had any more palpitations since drinking more water. Dr. Bing Matter assessed that we would not need to apply another monitor at this time; let pt know this and asked him to let us know if his palpitations resume. Pt verbalized understanding and thanked [place] for the call.

## 2021-04-07 DIAGNOSIS — I10 Essential (primary) hypertension: Secondary | ICD-10-CM | POA: Insufficient documentation

## 2021-04-07 DIAGNOSIS — G43909 Migraine, unspecified, not intractable, without status migrainosus: Secondary | ICD-10-CM | POA: Insufficient documentation

## 2021-04-07 DIAGNOSIS — E7800 Pure hypercholesterolemia, unspecified: Secondary | ICD-10-CM | POA: Insufficient documentation

## 2021-04-07 DIAGNOSIS — I739 Peripheral vascular disease, unspecified: Secondary | ICD-10-CM | POA: Insufficient documentation

## 2021-04-07 DIAGNOSIS — I2510 Atherosclerotic heart disease of native coronary artery without angina pectoris: Secondary | ICD-10-CM | POA: Insufficient documentation

## 2021-04-07 DIAGNOSIS — E785 Hyperlipidemia, unspecified: Secondary | ICD-10-CM | POA: Insufficient documentation

## 2021-04-12 ENCOUNTER — Other Ambulatory Visit: Payer: Self-pay

## 2021-04-12 ENCOUNTER — Encounter: Payer: Self-pay | Admitting: Cardiology

## 2021-04-12 ENCOUNTER — Ambulatory Visit (INDEPENDENT_AMBULATORY_CARE_PROVIDER_SITE_OTHER): Payer: BC Managed Care – PPO | Admitting: Cardiology

## 2021-04-12 VITALS — BP 126/78 | HR 69 | Ht 70.5 in | Wt 199.0 lb

## 2021-04-12 DIAGNOSIS — I739 Peripheral vascular disease, unspecified: Secondary | ICD-10-CM

## 2021-04-12 DIAGNOSIS — E785 Hyperlipidemia, unspecified: Secondary | ICD-10-CM | POA: Diagnosis not present

## 2021-04-12 DIAGNOSIS — E782 Mixed hyperlipidemia: Secondary | ICD-10-CM | POA: Diagnosis not present

## 2021-04-12 DIAGNOSIS — I2510 Atherosclerotic heart disease of native coronary artery without angina pectoris: Secondary | ICD-10-CM | POA: Diagnosis not present

## 2021-04-12 NOTE — Patient Instructions (Signed)
Medication Instructions:  Your physician recommends that you continue on your current medications as directed. Please refer to the Current Medication list given to you today.  *If you need a refill on your cardiac medications before your next appointment, please call your pharmacy*   Lab Work: None If you have labs (blood work) drawn today and your tests are completely normal, you will receive your results only by: MyChart Message (if you have MyChart) OR A paper copy in the mail If you have any lab test that is abnormal or we need to change your treatment, we will call you to review the results.   Testing/Procedures: Your physician has requested that you have an echocardiogram. Echocardiography is a painless test that uses sound waves to create images of your heart. It provides your doctor with information about the size and shape of your heart and how well your heart's chambers and valves are working. This procedure takes approximately one hour. There are no restrictions for this procedure.    Follow-Up: At [facility], you and your health needs are our priority.  As part of our continuing mission to provide you with exceptional heart care, we have created designated Provider Care Teams.  These Care Teams include your primary Cardiologist (physician) and Advanced Practice Providers (APPs -  Physician Assistants and Nurse Practitioners) who all work together to provide you with the care you need, when you need it.  We recommend signing up for the patient portal called "MyChart".  Sign up information is provided on this After Visit Summary.  MyChart is used to connect with patients for Virtual Visits (Telemedicine).  Patients are able to view lab/test results, encounter notes, upcoming appointments, etc.  Non-urgent messages can be sent to your provider as well.   To learn more about what you can do with MyChart, go to [email or web address].    Your next appointment:   6  month(s)  The format for your next appointment:   In Person  Provider:   Robert Krasowski, MD   Other Instructions   

## 2021-04-12 NOTE — Progress Notes (Signed)
Cardiology Office Note:    Date:  04/12/2021   ID:  Steven Ford, DOB 11/29/1960, MRN [id number]  PCP:  Pcp, No  Cardiologist:  Gypsy Balsam, MD    Referring MD: No ref. provider found   Chief Complaint  Patient presents with  . Palpitations    Monitor results    History of Present Illness:    Steven Ford is a 60 y.o. male with past medical history significant for coronary artery disease, status post coronary artery bypass graft years ago, cardiomyopathy with ejection fraction labral of 20%, peripheral vascular disease status post arthrectomy done on left SFA IM in March 2021 but then he required 3 intervention the summer at that time laser was performed and drug-eluting stent was implanted.  He does have dyslipidemia. Comes today to my office for follow-up overall he is doing great he denies any chest pain tightness squeezing pressure burning chest.  Couple weeks ago he called [place] complaining of having palpitations monitor was placed he remained to monitor for 2 days which showed supraventricular tachycardia.  He told me that he find out what was the reason for his palpitations he was simply dehydrated, started drinking more fluid will have is your problem now.  Denies have any chest pain tightness squeezing pressure burning chest.  He is a Psychologist, occupational and still exercise a lot and have no difficulty doing it.  No cardiac complaints.  Past Medical History:  Diagnosis Date  . Abnormal echocardiogram 02/12/2020   Echo 12/21/2019 shower decreased EF of 40%, grade 2DD, with global hypokinesia. Stress Myoview did not show evidence of ischemia.   . Claudication in peripheral vascular disease (HCC) 07/15/2018   Peripheral arterial disease  . Coronary artery disease   . Coronary artery disease involving native coronary artery of native heart without angina pectoris 11/01/2015  . Dyslipidemia 11/01/2015   Hyperlipidemia  . Encounter to establish care 03/07/2021  . High cholesterol   .  Hyperlipidemia   . Hypertension   . Migraine    "none in a long long while" (09/04/2018)  . Preop cardiovascular exam 12/11/2019  . Primary osteoarthritis of right knee 03/07/2021  . PVD (peripheral vascular disease) (HCC)   . 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  . 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 ABDOMINAL AORTOGRAM W/LOWER EXTREMITY  02/18/2020   ABDOMINAL AORTOGRAM W/LOWER EXTREMITY   . ABDOMINAL AORTOGRAM W/LOWER EXTREMITY Right 02/18/2020   Procedure: ABDOMINAL AORTOGRAM W/LOWER EXTREMITY;  Surgeon: Runell Gess, MD;  Location: [facility] INVASIVE CV LAB;  Service: Cardiovascular;  Laterality: Right;  . ABDOMINAL AORTOGRAM W/LOWER EXTREMITY Right 05/23/2020   Procedure: ABDOMINAL AORTOGRAM W/LOWER EXTREMITY;  Surgeon: Runell Gess, MD;  Location: [facility] INVASIVE CV LAB;  Service: Cardiovascular;  Laterality: Right;  . CORONARY ARTERY BYPASS GRAFT  2002   "triple"  . KNEE ARTHROSCOPY Right 2019  . LOWER EXTREMITY ANGIOGRAPHY N/A 08/28/2018   Procedure: LOWER EXTREMITY ANGIOGRAPHY;  Surgeon: Runell Gess, MD;  Location: [facility] INVASIVE CV LAB;  Service: Cardiovascular;  Laterality: N/A;  . LOWER EXTREMITY ANGIOGRAPHY N/A 09/04/2018   Procedure: LOWER EXTREMITY ANGIOGRAPHY;  Surgeon: Runell Gess, MD;  Location: [facility] INVASIVE CV LAB;  Service: Cardiovascular;  Laterality: N/A;  . PERIPHERAL VASCULAR ATHERECTOMY  02/18/2020   Procedure: PERIPHERAL VASCULAR ATHERECTOMY;  Surgeon: Runell Gess, MD;  Location: [facility] INVASIVE CV LAB;  Service: Cardiovascular;;  rt SFA  . PERIPHERAL VASCULAR ATHERECTOMY  05/23/2020  Procedure: PERIPHERAL VASCULAR ATHERECTOMY;  Surgeon: Runell Gess, MD;  Location: [facility] INVASIVE CV LAB;  Service: Cardiovascular;;  Laser-Left SFA  . PERIPHERAL VASCULAR BALLOON ANGIOPLASTY  05/23/2020   Procedure: PERIPHERAL VASCULAR BALLOON ANGIOPLASTY;  Surgeon: Runell Gess, MD;   Location: [facility] INVASIVE CV LAB;  Service: Cardiovascular;;  Left SFA  . PERIPHERAL VASCULAR INTERVENTION Left 09/04/2018  . PERIPHERAL VASCULAR INTERVENTION Left 09/04/2018   Procedure: PERIPHERAL VASCULAR INTERVENTION;  Surgeon: Runell Gess, MD;  Location: [facility] INVASIVE CV LAB;  Service: Cardiovascular;  Laterality: Left;  . PERIPHERAL VASCULAR INTERVENTION  02/18/2020   Procedure: PERIPHERAL VASCULAR INTERVENTION;  Surgeon: Runell Gess, MD;  Location: [facility] INVASIVE CV LAB;  Service: Cardiovascular;;  rt SFA    Current Medications: Current Meds  Medication Sig  . aspirin EC 81 MG tablet Take 81 mg by mouth daily.  Marland Kitchen atorvastatin (LIPITOR) 40 MG tablet Take 1 tablet (40 mg total) by mouth daily at 6 PM. (Patient taking differently: Take 40 mg by mouth daily.)  . clobetasol ointment (TEMOVATE) 0.05 % Apply 1 application topically 2 (two) times daily as needed (skin irritation/rash).   . clopidogrel (PLAVIX) 75 MG tablet TAKE 1 TABLET (75 MG TOTAL) BY MOUTH DAILY WITH BREAKFAST.  Marland Kitchen lisinopril (ZESTRIL) 10 MG tablet Take 1 tablet (10 mg total) by mouth daily. Please schedule an office visit for further refills  . metoprolol succinate (TOPROL-XL) 25 MG 24 hr tablet Take 1 tablet (25 mg total) by mouth daily for 15 days.  . Multiple Vitamins-Minerals (MULTIVITAMIN WITH MINERALS) tablet Take 1 tablet by mouth daily. unknown STRENGTH  . tacrolimus (PROTOPIC) 0.1 % ointment Apply 1 application topically 2 (two) times daily as needed (skin irritation (hands)).   Marland Kitchen tadalafil (CIALIS) 20 MG tablet TAKE 1 TABLET BY MOUTH EVERY DAY AS NEEDED FOR ERECTILE DYSFUNCTION. PA DENIED (Patient taking differently: Take 20 mg by mouth daily as needed for erectile dysfunction.)   Current Facility-Administered Medications for the 04/12/21 encounter (Office Visit) with Georgeanna Lea, MD  Medication  . sodium chloride flush (NS) 0.9 % injection 3 mL     Allergies:   Influenza vaccines   Social History    Socioeconomic History  . Marital status: Married    Spouse name: Not on file  . Number of children: Not on file  . Years of education: Not on file  . Highest education level: Not on file  Occupational History  . Not on file  Tobacco Use  . Smoking status: Light Tobacco Smoker    Years: 5.00    Types: Cigars  . Smokeless tobacco: Never Used  . Tobacco comment: 09/04/2018 "q time I play golf"  Vaping Use  . Vaping Use: Never used  Substance and Sexual Activity  . Alcohol use: Yes    Alcohol/week: 2.0 standard drinks    Types: 2 Cans of beer per week  . Drug use: Yes  . Sexual activity: Yes    Partners: Female  Other Topics Concern  . Not on file  Social History Narrative  . Not on file   Social Determinants of Health   Financial Resource Strain: Not on file  Food Insecurity: Not on file  Transportation Needs: Not on file  Physical Activity: Not on file  Stress: Not on file  Social Connections: Not on file     Family History: The patient's family history includes Anemia in his father; Heart disease in his mother; High blood pressure in his mother. ROS:  Please see the history of present illness.    All 14 point review of systems negative except as described per history of present illness  EKGs/Labs/Other Studies Reviewed:      Recent Labs: 05/06/2020: ALT 27; BUN 22; Creatinine, Ser 0.92; Hemoglobin 14.9; Platelets 273; Potassium 4.4; Sodium 142; TSH 1.670  Recent Lipid Panel    Component Value Date/Time   CHOL 75 (L) 05/06/2020 0853   TRIG 48 05/06/2020 0853   HDL 46 05/06/2020 0853   CHOLHDL 1.6 05/06/2020 0853   LDLCALC 16 05/06/2020 0853    Physical Exam:    VS:  BP 126/78 (BP Location: Left Arm, Patient Position: Sitting)   Pulse 69   Ht 5' 10.5" (1.791 m)   Wt 199 lb (90.3 kg)   SpO2 94%   BMI 28.15 kg/m     Wt Readings from Last 3 Encounters:  04/12/21 199 lb (90.3 kg)  03/07/21 199 lb 6.4 oz (90.4 kg)  01/03/21 200 lb 12.8 oz (91.1 kg)      GEN:  Well nourished, well developed in no acute distress HEENT: Normal NECK: No JVD; No carotid bruits LYMPHATICS: No lymphadenopathy CARDIAC: RRR, no murmurs, no rubs, no gallops RESPIRATORY:  Clear to auscultation without rales, wheezing or rhonchi  ABDOMEN: Soft, non-tender, non-distended MUSCULOSKELETAL:  No edema; No deformity  SKIN: Warm and dry LOWER EXTREMITIES: no swelling NEUROLOGIC:  Alert and oriented x 3 PSYCHIATRIC:  Normal affect   ASSESSMENT:    1. Coronary artery disease involving native coronary artery of native heart without angina pectoris   2. Dyslipidemia   3. PVD (peripheral vascular disease) (HCC)   4. Mixed hyperlipidemia    PLAN:    In order of problems listed above:  1. Coronary disease stable from that point review stress test 1 year ago showed no evidence of ischemia. 2. Dyslipidemia he is aggressively managed with his LDL of 60 which I will continue.  Peripheral vascular disease he still on dual antiplatelet therapy which I will continue. 3. He required knee replacement surgery however he want to postpone it until he his job will be more stable which we talking about may be 2 or 3 months.  In the meantime I will ask him to have an echocardiogram to check left ventricle ejection fraction and decide about therapy.   Medication Adjustments/Labs and Tests Ordered: Current medicines are reviewed at length with the patient today.  Concerns regarding medicines are outlined above.  No orders of the defined types were placed in this encounter.  Medication changes: No orders of the defined types were placed in this encounter.   Signed, Georgeanna Lea, MD, [facility] 04/12/2021 3:55 PM    [facility]

## 2021-04-17 ENCOUNTER — Telehealth: Payer: Self-pay | Admitting: Nurse Practitioner

## 2021-04-17 NOTE — Telephone Encounter (Signed)
Surgical clearance form faxed to our office from [facility]. Pt needs to schedule surgical clearance before form can be filled out.   Please contact pt to schedule. AS, CMA

## 2021-04-17 NOTE — Telephone Encounter (Signed)
Left voicemail for patient

## 2021-05-05 ENCOUNTER — Other Ambulatory Visit: Payer: BC Managed Care – PPO

## 2021-05-13 ENCOUNTER — Other Ambulatory Visit: Payer: Self-pay | Admitting: Cardiology

## 2021-05-15 NOTE — Telephone Encounter (Signed)
Refill sent to pharmacy.   

## 2021-05-25 ENCOUNTER — Ambulatory Visit: Payer: BC Managed Care – PPO | Admitting: Nurse Practitioner

## 2021-05-26 ENCOUNTER — Ambulatory Visit (INDEPENDENT_AMBULATORY_CARE_PROVIDER_SITE_OTHER): Payer: BC Managed Care – PPO

## 2021-05-26 ENCOUNTER — Other Ambulatory Visit: Payer: Self-pay

## 2021-05-26 DIAGNOSIS — E782 Mixed hyperlipidemia: Secondary | ICD-10-CM

## 2021-05-26 DIAGNOSIS — I739 Peripheral vascular disease, unspecified: Secondary | ICD-10-CM

## 2021-05-26 DIAGNOSIS — I2510 Atherosclerotic heart disease of native coronary artery without angina pectoris: Secondary | ICD-10-CM

## 2021-05-26 DIAGNOSIS — E785 Hyperlipidemia, unspecified: Secondary | ICD-10-CM

## 2021-05-26 LAB — ECHOCARDIOGRAM COMPLETE
Area-P 1/2: 2.81 cm2
S' Lateral: 4 cm

## 2021-05-26 NOTE — Progress Notes (Deleted)
Complete echocardiogram performed.  [name] Chiyo Fay RDCS, RVT  

## 2021-05-26 NOTE — Progress Notes (Signed)
Complete echocardiogram performed.  [name] [name] RDCS, RVT  

## 2021-05-31 ENCOUNTER — Telehealth: Payer: Self-pay

## 2021-05-31 MED ORDER — SACUBITRIL-VALSARTAN 24-26 MG PO TABS: 1.0000 | ORAL_TABLET | Freq: Two times a day (BID) | ORAL | 3 refills | Status: DC

## 2021-05-31 NOTE — Telephone Encounter (Signed)
-----   Message from Georgeanna Lea, MD sent at 05/30/2021  4:55 PM EDT ----- Echocardiogram showed persistently likely mildly diminished left ventricle ejection fraction 40 to 45%.  Please stop lisinopril and 48 hours later start Entresto 24/26 twice daily

## 2021-06-01 ENCOUNTER — Other Ambulatory Visit: Payer: Self-pay

## 2021-06-01 ENCOUNTER — Encounter: Payer: Self-pay | Admitting: Nurse Practitioner

## 2021-06-01 ENCOUNTER — Ambulatory Visit (INDEPENDENT_AMBULATORY_CARE_PROVIDER_SITE_OTHER): Payer: BC Managed Care – PPO | Admitting: Nurse Practitioner

## 2021-06-01 VITALS — BP 138/79 | HR 57 | Temp 98.7°F | Ht 70.5 in | Wt 198.7 lb

## 2021-06-01 DIAGNOSIS — Z6828 Body mass index (BMI) 28.0-28.9, adult: Secondary | ICD-10-CM

## 2021-06-01 DIAGNOSIS — E782 Mixed hyperlipidemia: Secondary | ICD-10-CM

## 2021-06-01 DIAGNOSIS — I10 Essential (primary) hypertension: Secondary | ICD-10-CM

## 2021-06-01 DIAGNOSIS — Z01818 Encounter for other preprocedural examination: Secondary | ICD-10-CM

## 2021-06-01 DIAGNOSIS — Z0000 Encounter for general adult medical examination without abnormal findings: Secondary | ICD-10-CM

## 2021-06-01 DIAGNOSIS — I2510 Atherosclerotic heart disease of native coronary artery without angina pectoris: Secondary | ICD-10-CM

## 2021-06-01 DIAGNOSIS — Z125 Encounter for screening for malignant neoplasm of prostate: Secondary | ICD-10-CM

## 2021-06-01 NOTE — Progress Notes (Signed)
Established Patient Office Visit  Subjective:  Patient ID: Steven Ford, male    DOB: June 01, 1961  Age: 60 y.o. MRN: [id number]  CC:  Chief Complaint  Patient presents with   Knee Pain     HPI Jenita Seashore Kras presents for surgical clearance.  The patient is to have knee replacement surgery. He is unsure of the date. He was originally supposed to have this done this summer. 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 States that he will likely wait to have the surgery until after football season has concluded.  Patient is seeing cardiology on a routine basis.  Last appointment was in May 2022.  Had echocardiogram done.  He does have grade 2 diastolic dysfunction with mild left ventricular hypertrophy and moderate right atrial hypertrophy.  There is dilation of abdominal aorta.  He has well-controlled blood pressure.  He is aggressively treated for high cholesterol.  He denies chest pain, chest pressure, shortness of breath, or headaches.  Exertion does not elicit any of these symptoms.  He does need to have blood work for surgery and for routine wellness evaluation.  Past Medical History:  Diagnosis Date   Abnormal echocardiogram 02/12/2020   Echo 12/21/2019 shower decreased EF of 40%, grade 2DD, with global hypokinesia. Stress Myoview did not show evidence of ischemia.    Claudication in peripheral vascular disease ([place]) 07/15/2018   Peripheral arterial disease   Coronary artery disease    Coronary artery disease involving native coronary artery of native heart without angina pectoris 11/01/2015   Dyslipidemia 11/01/2015   Hyperlipidemia   Encounter to establish care 03/07/2021   High cholesterol    Hyperlipidemia    Hypertension    Migraine    "none in a long long while" (09/04/2018)   Preop cardiovascular exam 12/11/2019   Primary osteoarthritis of right knee 03/07/2021   PVD (peripheral vascular disease) ([place])    Status post coronary artery bypass  graft 11/01/2015   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    Past Surgical History:  Procedure Laterality Date   ABDOMINAL AORTOGRAM W/LOWER EXTREMITY  02/18/2020   ABDOMINAL AORTOGRAM W/LOWER EXTREMITY    ABDOMINAL AORTOGRAM W/LOWER EXTREMITY Right 02/18/2020   Procedure: ABDOMINAL AORTOGRAM W/LOWER EXTREMITY;  Surgeon: Lorretta Harp, MD;  Location: [place] CV LAB;  Service: Cardiovascular;  Laterality: Right;   ABDOMINAL AORTOGRAM W/LOWER EXTREMITY Right 05/23/2020   Procedure: ABDOMINAL AORTOGRAM W/LOWER EXTREMITY;  Surgeon: Lorretta Harp, MD;  Location: [place] CV LAB;  Service: Cardiovascular;  Laterality: Right;   CORONARY ARTERY BYPASS GRAFT  2002   "triple"   KNEE ARTHROSCOPY Right 2019   LOWER EXTREMITY ANGIOGRAPHY N/A 08/28/2018   Procedure: LOWER EXTREMITY ANGIOGRAPHY;  Surgeon: Lorretta Harp, MD;  Location: [place] CV LAB;  Service: Cardiovascular;  Laterality: N/A;   LOWER EXTREMITY ANGIOGRAPHY N/A 09/04/2018   Procedure: LOWER EXTREMITY ANGIOGRAPHY;  Surgeon: Lorretta Harp, MD;  Location: [place] CV LAB;  Service: Cardiovascular;  Laterality: N/A;   PERIPHERAL VASCULAR ATHERECTOMY  02/18/2020   Procedure: PERIPHERAL VASCULAR ATHERECTOMY;  Surgeon: Lorretta Harp, MD;  Location: [place] CV LAB;  Service: Cardiovascular;;  rt SFA   PERIPHERAL VASCULAR ATHERECTOMY  05/23/2020   Procedure: PERIPHERAL VASCULAR ATHERECTOMY;  Surgeon: Lorretta Harp, MD;  Location: [place] CV LAB;  Service: Cardiovascular;;  Laser-Left SFA   PERIPHERAL VASCULAR BALLOON ANGIOPLASTY  05/23/2020   Procedure: PERIPHERAL VASCULAR BALLOON ANGIOPLASTY;  Surgeon: Lorretta Harp, MD;  Location: [place] CV LAB;  Service: Cardiovascular;;  Left SFA   PERIPHERAL VASCULAR INTERVENTION Left 09/04/2018   PERIPHERAL VASCULAR INTERVENTION Left 09/04/2018   Procedure: PERIPHERAL VASCULAR INTERVENTION;  Surgeon: Lorretta Harp, MD;  Location: [place] CV LAB;   Service: Cardiovascular;  Laterality: Left;   PERIPHERAL VASCULAR INTERVENTION  02/18/2020   Procedure: PERIPHERAL VASCULAR INTERVENTION;  Surgeon: Lorretta Harp, MD;  Location: [place] CV LAB;  Service: Cardiovascular;;  rt SFA    Family History  Problem Relation Age of Onset   Heart disease Mother    High blood pressure Mother    Anemia Father     Social History   Socioeconomic History   Marital status: Married    Spouse name: Not on file   Number of children: Not on file   Years of education: Not on file   Highest education level: Not on file  Occupational History   Not on file  Tobacco Use   Smoking status: Light Smoker    Types: Cigars   Smokeless tobacco: Never   Tobacco comments:    09/04/2018 "q time I play golf"  Vaping Use   Vaping Use: Never used  Substance and Sexual Activity   Alcohol use: Yes    Alcohol/week: 2.0 standard drinks    Types: 2 Cans of beer per week   Drug use: Not Currently   Sexual activity: Yes    Partners: Female  Other Topics Concern   Not on file  Social History Narrative   Not on file   Social Determinants of Health   Financial Resource Strain: Not on file  Food Insecurity: Not on file  Transportation Needs: Not on file  Physical Activity: Not on file  Stress: Not on file  Social Connections: Not on file  Intimate Partner Violence: Not on file    Outpatient Medications Prior to Visit  Medication Sig Dispense Refill   aspirin EC 81 MG tablet Take 81 mg by mouth daily.     atorvastatin (LIPITOR) 40 MG tablet TAKE 1 TABLET (40 MG TOTAL) BY MOUTH DAILY AT 6 PM. 90 tablet 3   clobetasol ointment (TEMOVATE) 7.78 % Apply 1 application topically 2 (two) times daily as needed (skin irritation/rash).      clopidogrel (PLAVIX) 75 MG tablet TAKE 1 TABLET (75 MG TOTAL) BY MOUTH DAILY WITH BREAKFAST. 90 tablet 2   Multiple Vitamins-Minerals (MULTIVITAMIN WITH MINERALS) tablet Take 1 tablet by mouth daily. unknown STRENGTH      sacubitril-valsartan (ENTRESTO) 24-26 MG Take 1 tablet by mouth 2 (two) times daily. 180 tablet 3   tacrolimus (PROTOPIC) 0.1 % ointment Apply 1 application topically 2 (two) times daily as needed (skin irritation (hands)).      tadalafil (CIALIS) 20 MG tablet TAKE 1 TABLET BY MOUTH EVERY DAY AS NEEDED FOR ERECTILE DYSFUNCTION. PA DENIED (Patient taking differently: Take 20 mg by mouth daily as needed for erectile dysfunction.) 6 tablet 1   metoprolol succinate (TOPROL-XL) 25 MG 24 hr tablet Take 1 tablet (25 mg total) by mouth daily for 15 days. 90 tablet 0   Facility-Administered Medications Prior to Visit  Medication Dose Route Frequency Provider Last Rate Last Admin   sodium chloride flush (NS) 0.9 % injection 3 mL  3 mL Intravenous Q12H Lorretta Harp, MD        Allergies  Allergen Reactions   Influenza Vaccines Other (See Comments)    Pt gets sick or sicker with flu shot  ROS Review of Systems  Constitutional:  Positive for activity change. Negative for chills, fatigue and fever.       Activity reduced due to right knee pain.  Will be scheduled to have knee replacement surgery in near future.  HENT:  Negative for congestion, postnasal drip, rhinorrhea, sinus pressure and sinus pain.   Eyes: Negative.   Respiratory:  Negative for cough, chest tightness and wheezing.   Cardiovascular:  Negative for chest pain and palpitations.  Gastrointestinal:  Negative for constipation, diarrhea, nausea and vomiting.  Endocrine: Negative for cold intolerance, heat intolerance, polydipsia and polyuria.  Musculoskeletal:  Positive for arthralgias and myalgias. Negative for back pain.       Right knee pain.  Patient to be scheduled for knee replacement surgery in near future.  Skin:  Negative for rash.  Allergic/Immunologic: Negative.   Neurological:  Negative for dizziness, weakness and headaches.  Hematological: Negative.   Psychiatric/Behavioral:  Negative for dysphoric mood. The patient  is not nervous/anxious.      Objective:    Physical Exam Vitals and nursing note reviewed.  Constitutional:      Appearance: Normal appearance. He is well-developed.  HENT:     Head: Normocephalic and atraumatic.     Nose: Nose normal.     Mouth/Throat:     Mouth: Mucous membranes are moist.  Eyes:     Extraocular Movements: Extraocular movements intact.     Conjunctiva/sclera: Conjunctivae normal.     Pupils: Pupils are equal, round, and reactive to light.  Cardiovascular:     Rate and Rhythm: Normal rate and regular rhythm.     Pulses: Normal pulses.     Heart sounds: Normal heart sounds.  Pulmonary:     Effort: Pulmonary effort is normal.     Breath sounds: Normal breath sounds.  Abdominal:     Palpations: Abdomen is soft.  Musculoskeletal:        General: Normal range of motion.     Cervical back: Normal range of motion and neck supple.  Lymphadenopathy:     Cervical: No cervical adenopathy.  Skin:    General: Skin is warm and dry.     Capillary Refill: Capillary refill takes less than 2 seconds.  Neurological:     General: No focal deficit present.     Mental Status: He is alert and oriented to person, place, and time.  Psychiatric:        Mood and Affect: Mood normal.        Behavior: Behavior normal.        Thought Content: Thought content normal.        Judgment: Judgment normal.    Today's Vitals   06/01/21 0934  BP: 138/79  Pulse: (!) 57  Temp: 98.7 F (37.1 C)  SpO2: 96%  Weight: 198 lb 11.2 oz (90.1 kg)  Height: 5' 10.5" (1.791 m)   Body mass index is 28.11 kg/m.   Wt Readings from Last 3 Encounters:  06/01/21 198 lb 11.2 oz (90.1 kg)  04/12/21 199 lb (90.3 kg)  03/07/21 199 lb 6.4 oz (90.4 kg)     Health Maintenance Due  Topic Date Due   Pneumococcal Vaccine 35-13 Years old (1 - PCV) Never done   TETANUS/TDAP  Never done   Zoster Vaccines- Shingrix (1 of 2) Never done   COVID-19 Vaccine (4 - Booster for Pfizer series) 03/25/2021     There are no preventive care reminders to display for this patient.  Lab  Results  Component Value Date   TSH 1.970 06/01/2021   Lab Results  Component Value Date   WBC 5.5 06/01/2021   HGB 14.6 06/01/2021   HCT 44.1 06/01/2021   MCV 90 06/01/2021   PLT 291 06/01/2021   Lab Results  Component Value Date   NA 137 06/01/2021   K 4.1 06/01/2021   CO2 21 06/01/2021   GLUCOSE 115 (H) 06/01/2021   BUN 17 06/01/2021   CREATININE 0.85 06/01/2021   BILITOT 0.3 06/01/2021   ALKPHOS 109 06/01/2021   AST 32 06/01/2021   ALT 33 06/01/2021   PROT 6.6 06/01/2021   ALBUMIN 4.5 06/01/2021   CALCIUM 8.6 06/01/2021   ANIONGAP 9 02/19/2020   EGFR 99 06/01/2021   Lab Results  Component Value Date   CHOL 134 06/01/2021   Lab Results  Component Value Date   HDL 43 06/01/2021   Lab Results  Component Value Date   LDLCALC 79 06/01/2021   Lab Results  Component Value Date   TRIG 54 06/01/2021   Lab Results  Component Value Date   CHOLHDL 3.1 06/01/2021   Lab Results  Component Value Date   HGBA1C 5.9 (H) 06/01/2021      Assessment & Plan:  1. Preprocedural general physical examination Patient appointment today to clear for right knee replacement surgery which is to be done in the near future.  To be cleared for surgery with moderate risk due to history of coronary artery disease and grade 2 diastolic dysfunction.  Patient has well-managed hypertension and high cholesterol.  Labs to be drawn at today's visit.  We will send medical clearance back to orthopedic provider when complete.  2. Coronary artery disease involving native coronary artery of native heart without angina pectoris Patient sees cardiology routinely had echo done 05/26/2021.  Does have grade 2 diastolic dysfunction with mild left ventricular hypertrophy.  There is right atrial hypertrophy and mild dilation of the abdominal aorta.  We will follow-up with cardiology as scheduled.  Check fasting lipid panel  today. - Lipid panel  3. Primary hypertension Stable.  Continue blood pressure medications as prescribed.  4. Mixed hyperlipidemia Fasting lipid panel ordered today.  Will adjust dosing of Lipitor as indicated.  5. Screening for prostate cancer PSA drawn with routine labs in the office today. - PSA  6. Body mass index 28.0-28.9, adult Patient very physically active.  Encouraged him to limit calorie intake to 1500 to 2000 cal/day.  7. Healthcare maintenance Routine, fasting labs drawn at today's visit. - CBC with Differential/Platelet - Comprehensive metabolic panel - Lipid panel - TSH - Hemoglobin A1c   Problem List Items Addressed This Visit       Cardiovascular and Mediastinum   Coronary artery disease involving native coronary artery of native heart without angina pectoris   Relevant Orders   Lipid panel (Completed)   Hypertension     Other   Hyperlipidemia   Preprocedural general physical examination - Primary   Screening for prostate cancer   Relevant Orders   PSA (Completed)   Body mass index 28.0-28.9, adult   Healthcare maintenance   Relevant Orders   CBC with Differential/Platelet (Completed)   Comprehensive metabolic panel (Completed)   Lipid panel (Completed)   TSH (Completed)   Hemoglobin A1c     Follow-up: Return for as scheduled.    Ronnell Freshwater, NP

## 2021-06-01 NOTE — Progress Notes (Signed)
Patient has appointment on 06/01/2021 for surgical clearance.

## 2021-06-02 LAB — COMPREHENSIVE METABOLIC PANEL
ALT: 33 IU/L (ref 0–44)
AST: 32 IU/L (ref 0–40)
Albumin/Globulin Ratio: 2.1 (ref 1.2–2.2)
Albumin: 4.5 g/dL (ref 3.8–4.9)
Alkaline Phosphatase: 109 IU/L (ref 44–121)
BUN/Creatinine Ratio: 20 (ref 10–24)
BUN: 17 mg/dL (ref 8–27)
Bilirubin Total: 0.3 mg/dL (ref 0.0–1.2)
CO2: 21 mmol/L (ref 20–29)
Calcium: 8.6 mg/dL (ref 8.6–10.2)
Chloride: 102 mmol/L (ref 96–106)
Creatinine, Ser: 0.85 mg/dL (ref 0.76–1.27)
Globulin, Total: 2.1 g/dL (ref 1.5–4.5)
Glucose: 115 mg/dL — ABNORMAL HIGH (ref 65–99)
Potassium: 4.1 mmol/L (ref 3.5–5.2)
Sodium: 137 mmol/L (ref 134–144)
Total Protein: 6.6 g/dL (ref 6.0–8.5)
eGFR: 99 mL/min/{1.73_m2} (ref >59)

## 2021-06-02 LAB — CBC WITH DIFFERENTIAL/PLATELET
Basophils Absolute: 0.1 10*3/uL (ref 0.0–0.2)
Basos: 1 %
EOS (ABSOLUTE): 0.3 10*3/uL (ref 0.0–0.4)
Eos: 5 %
Hematocrit: 44.1 % (ref 37.5–51.0)
Hemoglobin: 14.6 g/dL (ref 13.0–17.7)
Immature Grans (Abs): 0 10*3/uL (ref 0.0–0.1)
Immature Granulocytes: 0 %
Lymphocytes Absolute: 2 10*3/uL (ref 0.7–3.1)
Lymphs: 36 %
MCH: 29.7 pg (ref 26.6–33.0)
MCHC: 33.1 g/dL (ref 31.5–35.7)
MCV: 90 fL (ref 79–97)
Monocytes Absolute: 0.4 10*3/uL (ref 0.1–0.9)
Monocytes: 8 %
Neutrophils Absolute: 2.7 10*3/uL (ref 1.4–7.0)
Neutrophils: 50 %
Platelets: 291 10*3/uL (ref 150–450)
RBC: 4.92 x10E6/uL (ref 4.14–5.80)
RDW: 12 % (ref 11.6–15.4)
WBC: 5.5 10*3/uL (ref 3.4–10.8)

## 2021-06-02 LAB — LIPID PANEL
Chol/HDL Ratio: 3.1 ratio (ref 0.0–5.0)
Cholesterol, Total: 134 mg/dL (ref 100–199)
HDL: 43 mg/dL (ref >39)
LDL Chol Calc (NIH): 79 mg/dL (ref 0–99)
Triglycerides: 54 mg/dL (ref 0–149)
VLDL Cholesterol Cal: 12 mg/dL (ref 5–40)

## 2021-06-02 LAB — TSH: TSH: 1.97 u[IU]/mL (ref 0.450–4.500)

## 2021-06-02 LAB — PSA: Prostate Specific Ag, Serum: 1.8 ng/mL (ref 0.0–4.0)

## 2021-06-02 LAB — HEMOGLOBIN A1C
Est. average glucose Bld gHb Est-mCnc: 123 mg/dL
Hgb A1c MFr Bld: 5.9 % — ABNORMAL HIGH (ref 4.8–5.6)

## 2021-06-02 NOTE — Progress Notes (Signed)
Labs show risk for diabetes with HgbA1c at 5.9. other labs good and will attach to surgical clearance note.

## 2021-06-11 ENCOUNTER — Encounter: Payer: Self-pay | Admitting: Nurse Practitioner

## 2021-06-11 DIAGNOSIS — Z01818 Encounter for other preprocedural examination: Secondary | ICD-10-CM | POA: Insufficient documentation

## 2021-06-11 DIAGNOSIS — Z0000 Encounter for general adult medical examination without abnormal findings: Secondary | ICD-10-CM | POA: Insufficient documentation

## 2021-06-11 DIAGNOSIS — Z125 Encounter for screening for malignant neoplasm of prostate: Secondary | ICD-10-CM | POA: Insufficient documentation

## 2021-06-11 DIAGNOSIS — Z6828 Body mass index (BMI) 28.0-28.9, adult: Secondary | ICD-10-CM | POA: Insufficient documentation

## 2021-06-11 NOTE — Patient Instructions (Signed)
Plan de alimentacin restringido en grasas y colesterol Fat and Cholesterol Restricted Eating Plan El exceso de grasas y colesterol en la dieta puede causar problemas de salud. Elegir los alimentos adecuados ayuda a mantener normales los niveles de grasasy colesterol. Esto puede evitarle contraer ciertas enfermedades. El mdico puede recomendarle un plan de alimentacin que incluya lo siguiente: Grasas totales: ______% o menos del total de caloras por da. Grasas saturadas: ______% o menos del total de caloras por da. Colesterol: menos de _________mg por da. Fibra: ______g por da. Consejos para seguir este plan Planificacin de las comidas En las comidas, divida su plato en cuatro partes iguales: Llene la mitad del plato con verduras y ensaladas de hojas verdes. Llene un cuarto del plato con cereales integrales. Llene un cuarto del plato con alimentos con protenas con bajo contenido de grasas (magras). Coma pescado con alto contenido de grasas omega3 al menos dos veces por semana. Esas grasas se encuentran en la caballa, el atn, las sardinas y el salmn. Coma alimentos con alto contenido de fibra, como cereales integrales, frijoles, manzanas, brcoli, zanahorias, guisantes y cebada. Consejos generales  Si necesita adelgazar, consulte a su mdico. Evite lo siguiente: Alimentos con azcar agregada. Comidas fritas. Alimentos con aceites parcialmente hidrogenados. Limite el consumo de alcohol a no ms de 1medida por da si es mujer y no est embarazada, y a 2medidas por da si es hombre. Una medida equivale a 12oz de cerveza, 5oz de vino o 1oz de bebidas alcohlicas de alta graduacin.  Lea las etiquetas de los alimentos Lea las etiquetas de los alimentos para conocer lo siguiente: Si contienen grasas trans. Si contienen aceites parcialmente hidrogenados. La cantidad de grasas saturadas (g) que contiene cada porcin. La cantidad de colesterol (mg) que contiene cada  porcin. La cantidad de fibra (g) que contiene cada porcin. Elija alimentos con grasas saludables, tales como las siguientes: Grasas monoinsaturadas. Grasas poliinsaturadas. Grasas omega3. Elija productos de cereal que tengan cereales integrales. Busque la palabra "integral" en el primer lugar de la lista de ingredientes. Al cocinar Emplee mtodos de coccin con poca cantidad de grasa. Por ejemplo, hornear, hervir, grillar y asar. Coma ms comidas caseras. Coma en restaurantes y bares con menos frecuencia. Evite cocinar usando grasas saturadas, como manteca, crema, aceite de palma, aceite de palmiste y aceite de coco. Alimentos recomendados  Frutas Frutas frescas, en conserva (en su jugo natural) o frutas congeladas. Verduras Verduras frescas o congeladas (crudas, al vapor, asadas o grilladas). Ensaladas de hojas verdes. Granos Cereales integrales, como panes, galletas, cereales y pastas de integrales o de trigo integral. Avena sin endulzar, trigo bulgur, cebada, quinua o arroz integral. Tortillas de harina de maz o trigo integral. Carnes y otros alimentos ricos en protenas Carne de res molida (al 85% o ms magra), carne de res de animales alimentados con pastos o carne de res sin la grasa. Pollo o pavo sin piel. Carne de pollo o de pavo molida. Cerdo sin la grasa. Todos los pescados y frutos de mar. Claras de huevo. Porotos, guisantes o lentejas secos. Frutos secos o semillas sin sal. Frijoles enlatados sin sal. Mantequillas de frutos secos sin azcar ni aceite agregados. Lcteos Productos lcteos descremados o semidescremados, como leche descremada o al 1%, quesos reducidos en grasas o al 2%, queso cottage o ricota con bajo contenido de grasas o sin contenido de grasas, o yogur natural descremado o semidescremado. Grasas y aceites Margarina untable que no contenga grasas trans. Mayonesa y condimentos para ensaladas livianos o reducidos en   grasas. [place]. Aceites de oliva, canola,  ssamo o crtamo. Es posible que los productos que se enumeran ms arriba no constituyan una lista completa de los alimentos y las bebidas que puede tomar. Consulte a unnutricionista para obtener ms informacin. Alimentos a evitar Frutas Fruta enlatada en almbar espeso. Frutas con salsa de crema o mantequilla. Frutas cocidas en aceite. Verduras Verduras cocinadas con salsas de queso, crema o mantequilla. Verduras fritas. Granos Pan blanco. Pastas blancas. Arroz blanco. Pan de maz. Bagels, pasteles y croissants. Galletas saladas y colaciones que contengan grasas trans y aceites hidrogenados. Carnes y otros alimentos ricos en protenas Cortes de carne con alto contenido de grasa. Costillas, alas de pollo, tocineta, salchicha, mortadela, salame, chinchulines, tocino, perros calientes, salchichas alemanas y embutidos envasados. Hgado y otros rganos. Huevos enteros y yemas de huevo. Pollo y pavo con piel. Carne frita. Lcteos Leche entera o al 2%, crema, mezcla de leche y crema, y queso crema. Quesos enteros. Yogur entero o endulzado. Quesos con toda su grasa. Cremas no lcteas y coberturas batidas. Quesos procesados, quesos para untar y cuajadas. Bebidas Alcohol. Bebidas endulzadas con azcar, como refrescos, limonada y bebidas frutales. Grasas y aceites Mantequilla, margarina en barra, manteca de cerdo, grasa, mantequilla clarificada o grasa de tocino. Aceites de coco, de palmiste y de palma. Dulces y postres Jarabe de maz, azcares, miel y melazas. Caramelos. Mermeladas y jaleas. Jarabe. Cereales endulzados. Galletas, pasteles, bizcochuelos, donas, muffins y helado. Es posible que los productos que se enumeran ms arriba no constituyan una lista completa de los alimentos y las bebidas que debe evitar. Consulte a unnutricionista para obtener ms informacin. Resumen Elegir los alimentos adecuados ayuda a mantener normales los niveles de grasas y colesterol. Esto puede evitarle contraer  ciertas enfermedades. En las comidas, llene la mitad del plato con verduras y ensaladas de hojas verdes. Coma alimentos con alto contenido de fibras, como cereales integrales, frijoles, manzanas, zanahorias, guisantes y cebada. Limite los alimentos con azcar agregada y grasas saturadas, el alcohol y las comidas fritas. Esta informacin no tiene como fin reemplazar el consejo del mdico. Asegresede hacerle al mdico cualquier pregunta que tenga. Document Revised: 05/20/2020 Document Reviewed: 05/20/2020 Elsevier Patient Education  2022 Elsevier Inc.  

## 2021-06-13 ENCOUNTER — Ambulatory Visit (HOSPITAL_COMMUNITY)
Admission: RE | Admit: 2021-06-13 | Payer: Self-pay | Source: Ambulatory Visit | Attending: Cardiovascular Disease | Admitting: Cardiovascular Disease

## 2021-06-15 ENCOUNTER — Telehealth: Payer: Self-pay

## 2021-06-15 NOTE — Telephone Encounter (Signed)
   [facility] Pre-operative Risk Assessment    Patient Name: Steven Ford  DOB: Jun 20, 1961 MRN: [id number]  HEARTCARE STAFF:  - IMPORTANT!!!!!! Under Visit Info/Reason for Call, type in Other and utilize the format Clearance MM/DD/YY or Clearance TBD. Do not use dashes or single digits. - Please review there is not already an duplicate clearance open for this procedure. - If request is for dental extraction, please clarify the # of teeth to be extracted. - If the patient is currently at the dentist's office, call Pre-Op Callback Staff (MA/nurse) to input urgent request.  - If the patient is not currently in the dentist office, please route to the Pre-Op pool.  Request for surgical clearance:  What type of surgery is being performed? RT KNEE REPLACEMENT   When is this surgery scheduled? TBD  What type of clearance is required (medical clearance vs. Pharmacy clearance to hold med vs. Both)? BOTH  Are there any medications that need to be held prior to surgery and how long? NOT LISTED*- TAKES PLAVIX  Practice name and name of physician performing surgery? [facility]  ATTN:SHERI  What is the office phone number? [phone]   7.   What is the office fax number? [phone]  8.   Anesthesia type (None, local, MAC, general) ? SPINAL   Steven Ford 06/15/2021, 3:52 PM  _________________________________________________________________   (provider comments below)

## 2021-06-16 ENCOUNTER — Other Ambulatory Visit (HOSPITAL_COMMUNITY): Payer: Self-pay | Admitting: Cardiovascular Disease

## 2021-06-16 DIAGNOSIS — I739 Peripheral vascular disease, unspecified: Secondary | ICD-10-CM

## 2021-06-19 NOTE — Telephone Encounter (Signed)
Steven Ford 60 year old male is requesting preoperative cardiac evaluation for right knee surgery.  He was last seen in the clinic on 04/12/2021.  During that time he reported that resolution of his SVT with improved hydration.  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  An echocardiogram was ordered and completed on 05/26/2021.  That showed an EF of 40-45%.  His lisinopril was stopped and he was started on Entresto 24-26.  His PMH includes PVD, coronary artery disease, dyslipidemia, hypertension, migraine, and primary osteoarthritis right knee.  May his Plavix be held prior to his surgery?  Thank you for your help.  Please direct your response to [facility] preop pool.  [name]. Brannan Cassedy NP-C    06/19/2021, 12:51 PM [facility] [address] Office [phone] Fax [phone]

## 2021-06-20 NOTE — Telephone Encounter (Signed)
   Primary Cardiologist: Gypsy Balsam, MD  Chart reviewed as part of pre-operative protocol coverage. Given past medical history and time since last visit, based on ACC/AHA guidelines, Steven Ford would be at acceptable risk for the planned procedure without further cardiovascular testing.   His Plavix may be held for 5 days prior to his procedure.  Please resume as soon as hemostasis is achieved.  I will route this recommendation to the requesting party via Epic fax function and remove from pre-op pool.  Please call with questions.  [name]. Jocie Meroney NP-C    06/20/2021, 2:50 PM [facility] [address] Office [phone] Fax [phone]

## 2021-06-22 ENCOUNTER — Ambulatory Visit (HOSPITAL_COMMUNITY)
Admission: RE | Admit: 2021-06-22 | Discharge: 2021-06-22 | Disposition: A | Discharge status: Home / Self Care | Payer: Self-pay | Source: Ambulatory Visit | Attending: Cardiology | Admitting: Cardiology

## 2021-06-22 ENCOUNTER — Other Ambulatory Visit: Payer: Self-pay

## 2021-06-22 DIAGNOSIS — I739 Peripheral vascular disease, unspecified: Secondary | ICD-10-CM | POA: Insufficient documentation

## 2021-08-18 ENCOUNTER — Telehealth: Payer: Self-pay

## 2021-08-18 NOTE — Telephone Encounter (Signed)
Pharmacy notified Lisinopril d/c on 11/11/2020 via fax

## 2021-08-24 ENCOUNTER — Telehealth: Payer: Self-pay

## 2021-08-24 DIAGNOSIS — L309 Dermatitis, unspecified: Secondary | ICD-10-CM | POA: Insufficient documentation

## 2021-08-24 NOTE — Telephone Encounter (Signed)
PA approved for Entresto 24-26 mg from 08/24/21 until 08/24/22.

## 2021-08-24 NOTE — Telephone Encounter (Signed)
PA started on CMM for Entresto 24-26 mg. Key [id number]

## 2021-09-06 ENCOUNTER — Encounter: Payer: BC Managed Care – PPO | Admitting: Nurse Practitioner

## 2022-01-10 ENCOUNTER — Telehealth: Payer: Self-pay

## 2022-01-10 NOTE — Telephone Encounter (Signed)
° °  Pre-operative Risk Assessment    Patient Name: HOSTEEN KIENAST  DOB: Dec 25, 1960 MRN: [id number]      Request for Surgical Clearance    Procedure:   Left total hip replacement   Date of Surgery:  Clearance 02/16/22                                 Surgeon:  Dr. Weber Cooks Surgeon's Group or Practice Name:  [facility] Phone number:  [phone] Fax number:  [phone]   Type of Clearance Requested:   - Medical    Type of Anesthesia:  Spinal   Additional requests/questions:  Please advise surgeon/provider what medications should be held.  Signed, Felecia Jan   01/10/2022, 9:43 AM

## 2022-01-10 NOTE — Telephone Encounter (Signed)
° °  Name: Steven Ford  DOB: 1961/08/25  MRN: [id number]  Primary Cardiologist: Jenne Campus, MD  Chart reviewed as part of pre-operative protocol coverage. Because of [name]'s past medical history and time since last visit, he will require a follow-up visit in order to better assess preoperative cardiovascular risk.  Patient has complex history of CAD s/p CABG, HFrEF EF 20%, PVD, dyslipidemia, SVT. At that visit the patient had inquired about having knee surgery and Dr. Agustin Cree recommended repeat echocardiogram with follow-up in 6 months. Echo was performed 05/2021 with EF 40-45%. At that time Dr. Agustin Cree stopped lisinopril and started [facility]. He has not had any follow-up or labs with [place] since the medication change.The patient then was supposed to follow-up 05/2021 for a surgical clearance visit but do not see this occurred.  Pre-op covering staff: - Please schedule appointment and call patient to inform them.  - Please contact requesting surgeon's office via preferred method (i.e, phone, fax) to inform them of need for appointment prior to surgery.  Re: Plavix, this can be referenced in a prior available clearance from 05/2021 if still appropriate at upcoming [place].  [name], PA-C  01/10/2022, 1:17 PM

## 2022-01-10 NOTE — Telephone Encounter (Signed)
I s/w the pt and he is agreeable to plan of care for pre op appt and is agreeable to go to the [facility] office. Pt has been scheduled to see Dr. Bing Matter 01/30/22 @ 2 pm at [facility] office. Pt grateful for the call and the help. I will update the MD for the upcoming appt. Will send FYI to surgeon's office the pt has appt 01/30/22.

## 2022-01-29 NOTE — H&P (Signed)
HIP ARTHROPLASTY ADMISSION H&P  Patient ID: Steven Ford MRN: [id number] DOB/AGE: 02-12-1961 61 y.o.  Chief Complaint: left hip pain.  Planned Procedure Date: 02-16-22 Medical Clearance by Leretha Pol NP-C   Cardiac Clearance by Dr. Agustin Cree   HPI: Steven Ford is a 61 y.o. male who presents for evaluation of OSTEOARTHRITIS LEFT HIP. The patient has a history of pain and functional disability in the left hip due to arthritis and has failed non-surgical conservative treatments for greater than 12 weeks to include NSAID's and/or analgesics, corticosteriod injections, and activity modification.  Onset of symptoms was gradual, starting 1 year ago with gradually worsening course since that time. The patient noted no past surgery on the left hip.  Patient currently rates pain at 8 out of 10 with activity. Patient has night pain, worsening of pain with activity and weight bearing, and pain that interferes with activities of daily living.  Patient has evidence of subchondral sclerosis, periarticular osteophytes, joint space narrowing, and superior migration of femoral head  by imaging studies.  There is no active infection.  Past Medical History:  Diagnosis Date   Abnormal echocardiogram 02/12/2020   Echo 12/21/2019 shower decreased EF of 40%, grade 2DD, with global hypokinesia. Stress Myoview did not show evidence of ischemia.    Claudication in peripheral vascular disease ([place]) 07/15/2018   Peripheral arterial disease   Coronary artery disease    Coronary artery disease involving native coronary artery of native heart without angina pectoris 11/01/2015   Dyslipidemia 11/01/2015   Hyperlipidemia   Encounter to establish care 03/07/2021   High cholesterol    Hyperlipidemia    Hypertension    Migraine    "none in a long long while" (09/04/2018)   Preop cardiovascular exam 12/11/2019   Primary osteoarthritis of right knee 03/07/2021   PVD (peripheral vascular disease) ([place])    Status  post coronary artery bypass graft 11/01/2015   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   Past Surgical History:  Procedure Laterality Date   ABDOMINAL AORTOGRAM W/LOWER EXTREMITY  02/18/2020   ABDOMINAL AORTOGRAM W/LOWER EXTREMITY    ABDOMINAL AORTOGRAM W/LOWER EXTREMITY Right 02/18/2020   Procedure: ABDOMINAL AORTOGRAM W/LOWER EXTREMITY;  Surgeon: Lorretta Harp, MD;  Location: [place] [facility];  Service: Cardiovascular;  Laterality: Right;   ABDOMINAL AORTOGRAM W/LOWER EXTREMITY Right 05/23/2020   Procedure: ABDOMINAL AORTOGRAM W/LOWER EXTREMITY;  Surgeon: Lorretta Harp, MD;  Location: [place] [facility];  Service: Cardiovascular;  Laterality: Right;   CORONARY ARTERY BYPASS GRAFT  2002   "triple"   KNEE ARTHROSCOPY Right 2019   LOWER EXTREMITY ANGIOGRAPHY N/A 08/28/2018   Procedure: LOWER EXTREMITY ANGIOGRAPHY;  Surgeon: Lorretta Harp, MD;  Location: [place] [facility];  Service: Cardiovascular;  Laterality: N/A;   LOWER EXTREMITY ANGIOGRAPHY N/A 09/04/2018   Procedure: LOWER EXTREMITY ANGIOGRAPHY;  Surgeon: Lorretta Harp, MD;  Location: [place] [facility];  Service: Cardiovascular;  Laterality: N/A;   PERIPHERAL VASCULAR ATHERECTOMY  02/18/2020   Procedure: PERIPHERAL VASCULAR ATHERECTOMY;  Surgeon: Lorretta Harp, MD;  Location: [place] [facility];  Service: Cardiovascular;;  rt SFA   PERIPHERAL VASCULAR ATHERECTOMY  05/23/2020   Procedure: PERIPHERAL VASCULAR ATHERECTOMY;  Surgeon: Lorretta Harp, MD;  Location: [place] [facility];  Service: Cardiovascular;;  Laser-Left SFA   PERIPHERAL VASCULAR BALLOON ANGIOPLASTY  05/23/2020   Procedure: PERIPHERAL VASCULAR BALLOON ANGIOPLASTY;  Surgeon: Lorretta Harp, MD;  Location: [facility];  Service: Cardiovascular;;  Left SFA  PERIPHERAL VASCULAR INTERVENTION Left 09/04/2018   PERIPHERAL VASCULAR INTERVENTION Left 09/04/2018   Procedure: PERIPHERAL VASCULAR INTERVENTION;  Surgeon: Lorretta Harp, MD;   Location: [place] [facility];  Service: Cardiovascular;  Laterality: Left;   PERIPHERAL VASCULAR INTERVENTION  02/18/2020   Procedure: PERIPHERAL VASCULAR INTERVENTION;  Surgeon: Lorretta Harp, MD;  Location: [place] [facility] [facility];  Service: Cardiovascular;;  rt SFA   Allergies  Allergen Reactions   Influenza Vaccines Other (See Comments)    Pt gets sick or sicker with flu shot    Prior to Admission medications   Medication Sig Start Date End Date Taking? Authorizing Provider  aspirin EC 81 MG tablet Take 81 mg by mouth daily.    [provider]  atorvastatin (LIPITOR) 40 MG tablet TAKE 1 TABLET (40 MG TOTAL) BY MOUTH DAILY AT 6 PM. 05/15/21   Park Liter, MD  clobetasol ointment (TEMOVATE) [id number] % Apply 1 application topically 2 (two) times daily as needed (skin irritation/rash).  04/26/20   [provider]  clopidogrel (PLAVIX) 75 MG tablet TAKE 1 TABLET (75 MG TOTAL) BY MOUTH DAILY WITH BREAKFAST. 01/09/21   Park Liter, MD  metoprolol succinate (TOPROL-XL) 25 MG 24 hr tablet Take 1 tablet (25 mg total) by mouth daily for 15 days. 02/01/20 05/12/21  Park Liter, MD  Multiple Vitamins-Minerals (MULTIVITAMIN WITH MINERALS) tablet Take 1 tablet by mouth daily. unknown STRENGTH    [provider]  sacubitril-valsartan (ENTRESTO) 24-26 MG Take 1 tablet by mouth 2 (two) times daily. 05/31/21   Park Liter, MD  tacrolimus (PROTOPIC) 0.1 % ointment Apply 1 application topically 2 (two) times daily as needed (skin irritation (hands)).  04/26/20   [provider]  tadalafil (CIALIS) 20 MG tablet TAKE 1 TABLET BY MOUTH EVERY DAY AS NEEDED FOR ERECTILE DYSFUNCTION. PA DENIED Patient taking differently: Take 20 mg by mouth daily as needed for erectile dysfunction. 09/05/20   Park Liter, MD   Social History   Socioeconomic History   Marital status: Married    Spouse name: Not on file   Number of children: Not on file   Years of  education: Not on file   Highest education level: Not on file  Occupational History   Not on file  Tobacco Use   Smoking status: Light Smoker    Types: Cigars   Smokeless tobacco: Never   Tobacco comments:    09/04/2018 "q time I play golf"  Vaping Use   Vaping Use: Never used  Substance and Sexual Activity   Alcohol use: Yes    Alcohol/week: 2.0 standard drinks    Types: 2 Cans of beer per week   Drug use: Not Currently   Sexual activity: Yes    Partners: Female  Other Topics Concern   Not on file  Social History Narrative   Not on file   Social Determinants of Health   Financial Resource Strain: Not on file  Food Insecurity: Not on file  Transportation Needs: Not on file  Physical Activity: Not on file  Stress: Not on file  Social Connections: Not on file   Family History  Problem Relation Age of Onset   Heart disease Mother    High blood pressure Mother    Anemia Father     ROS: Currently denies lightheadedness, dizziness, Fever, chills, CP, SOB.   No personal history of DVT, PE, MI, or CVA. No loose teeth + partial dentures All other systems have been  reviewed and were otherwise currently negative with the exception of those mentioned in the HPI and as above.  Objective: Vitals: Ht: 5'10" Wt: 200.8 lbs Temp: 97.7 BP: 144/86 Pulse: 68 O2 96% on room air.   Physical Exam: General: Alert, NAD. Trendelenberg Gait  HEENT: EOMI, Good Neck Extension  Pulm: No increased work of breathing.  Clear B/L A/P w/o crackle or wheeze.  CV: RRR, No m/g/r appreciated  GI: soft, NT, ND. BS x 4 quadrants Neuro: CN II-XII grossly intact without focal deficit.  Sensation intact distally Skin: No lesions in the area of chief complaint MSK/Surgical Site: + TTP. Hip pain with ROM. + Stinchfield. - SLR. - FABER. + FADIR. 5/5 strength.  NVI.    Imaging Review Plain radiographs demonstrate moderate degenerative joint disease of the left hip.   The bone quality appears to be fair  for age and reported activity level.  Preoperative templating of the joint replacement has been completed, documented, and submitted to the Operating Room personnel in order to optimize intra-operative equipment management.  Assessment: OSTEOARTHRITIS LEFT HIP Active Problems:   * No active hospital problems. *   Plan: Plan for Procedure(s): TOTAL HIP ARTHROPLASTY  The patient history, physical exam, clinical judgement of the provider and imaging are consistent with end stage degenerative joint disease and total joint arthroplasty is deemed medically necessary. The treatment options including medical management, injection therapy, and arthroplasty were discussed at length. The risks and benefits of Procedure(s): TOTAL HIP ARTHROPLASTY were presented and reviewed.  The risks of nonoperative treatment, versus surgical intervention including but not limited to continued pain, aseptic loosening, stiffness, dislocation/subluxation, infection, bleeding, nerve injury, blood clots, cardiopulmonary complications, morbidity, mortality, among others were discussed. The patient verbalizes understanding and wishes to proceed with the plan.  Patient is being admitted for surgery, pain control, PT, prophylactic antibiotics, VTE prophylaxis, progressive ambulation, ADL's and discharge planning.   Dental prophylaxis discussed and recommended for 2 years postoperatively.  The patient does meet the criteria for TXA which will be used perioperatively.   ASA 81 mg BID will be used postoperatively for DVT prophylaxis in addition to SCDs, and early ambulation. Plan for Oxycodone, Celebrex, Tylenol for pain.   Zofran for nausea and vomiting. Colace for constipation Pharmacy- [place] The patient is planning to be discharged home with OPPT and into the care of his wife Linus Orn who can be reached at [phone] Follow up appt 03-06-22 at [facility] Office [id number] 01/29/2022 5:50  PM

## 2022-01-30 ENCOUNTER — Ambulatory Visit: Payer: BC Managed Care – PPO | Admitting: Cardiology

## 2022-01-30 ENCOUNTER — Encounter: Payer: Self-pay | Admitting: Cardiology

## 2022-01-30 ENCOUNTER — Other Ambulatory Visit: Payer: Self-pay

## 2022-01-30 VITALS — BP 122/70 | HR 64 | Ht 70.5 in | Wt 200.0 lb

## 2022-01-30 DIAGNOSIS — I739 Peripheral vascular disease, unspecified: Secondary | ICD-10-CM | POA: Diagnosis not present

## 2022-01-30 DIAGNOSIS — R079 Chest pain, unspecified: Secondary | ICD-10-CM

## 2022-01-30 DIAGNOSIS — I10 Essential (primary) hypertension: Secondary | ICD-10-CM

## 2022-01-30 DIAGNOSIS — I2510 Atherosclerotic heart disease of native coronary artery without angina pectoris: Secondary | ICD-10-CM | POA: Diagnosis not present

## 2022-01-30 DIAGNOSIS — Z951 Presence of aortocoronary bypass graft: Secondary | ICD-10-CM

## 2022-01-30 DIAGNOSIS — E785 Hyperlipidemia, unspecified: Secondary | ICD-10-CM

## 2022-01-30 DIAGNOSIS — Z01810 Encounter for preprocedural cardiovascular examination: Secondary | ICD-10-CM | POA: Diagnosis not present

## 2022-01-30 NOTE — Patient Instructions (Signed)
Medication Instructions:  Your physician recommends that you continue on your current medications as directed. Please refer to the Current Medication list given to you today.  *If you need a refill on your cardiac medications before your next appointment, please call your pharmacy*   Lab Work: Your physician recommends that you return for lab work in:   Labs today: Direct LDL, BMP  If you have labs (blood work) drawn today and your tests are completely normal, you will receive your results only by: MyChart Message (if you have MyChart) OR A paper copy in the mail If you have any lab test that is abnormal or we need to change your treatment, we will call you to review the results.   Testing/Procedures: Your physician has requested that you have a lower or upper extremity arterial duplex. This test is an ultrasound of the arteries in the legs or arms. It looks at arterial blood flow in the legs and arms. Allow one hour for Lower and Upper Arterial scans. There are no restrictions or special instructions  Medication Instructions:  Your physician recommends that you continue on your current medications as directed. Please refer to the Current Medication list given to you today.  *If you need a refill on your cardiac medications before your next appointment, please call your pharmacy*   Lab Work: None ordered If you have labs (blood work) drawn today and your tests are completely normal, you will receive your results only by: MyChart Message (if you have MyChart) OR A paper copy in the mail If you have any lab test that is abnormal or we need to change your treatment, we will call you to review the results.   Testing/Procedures: Your physician has requested that you have a lexiscan myoview. For further information please visit [email or web address] Please follow instruction sheet, as given.  The test will take approximately 3 to 4 hours to complete; you may bring reading material.  If  someone comes with you to your appointment, they will need to remain in the main lobby due to limited space in the testing area. **If you are pregnant or breastfeeding, please notify the nuclear lab prior to your appointment**  How to prepare for your Myocardial Perfusion Test: Do not eat or drink 3 hours prior to your test, except you may have water. Do not consume products containing caffeine (regular or decaffeinated) 12 hours prior to your test. (ex: coffee, chocolate, sodas, tea). Do bring a list of your current medications with you.  If not listed below, you may take your medications as normal. Do wear comfortable clothes (no dresses or overalls) and walking shoes, tennis shoes preferred (No heels or open toe shoes are allowed). Do NOT wear cologne, perfume, aftershave, or lotions (deodorant is allowed). If these instructions are not followed, your test will have to be rescheduled.    Follow-Up: At [facility], you and your health needs are our priority.  As part of our continuing mission to provide you with exceptional heart care, we have created designated Provider Care Teams.  These Care Teams include your primary Cardiologist (physician) and Advanced Practice Providers (APPs -  Physician Assistants and Nurse Practitioners) who all work together to provide you with the care you need, when you need it.  We recommend signing up for the patient portal called "MyChart".  Sign up information is provided on this After Visit Summary.  MyChart is used to connect with patients for Virtual Visits (Telemedicine).  Patients are able to view  lab/test results, encounter notes, upcoming appointments, etc.  Non-urgent messages can be sent to your provider as well.   To learn more about what you can do with MyChart, go to [email or web address].    Your next appointment:   5 month(s)  The format for your next appointment:   In Person  Provider:   Gypsy Balsam, MD   Other  Instructions Cardiac Nuclear Scan A cardiac nuclear scan is a test that is done to check the flow of blood to your heart. It is done when you are resting and when you are exercising. The test looks for problems such as: Not enough blood reaching a portion of the heart. The heart muscle not working as it should. You may need this test if: You have heart disease. You have had lab results that are not normal. You have had heart surgery or a balloon procedure to open up blocked arteries (angioplasty). You have chest pain. You have shortness of breath. In this test, a special dye (tracer) is put into your bloodstream. The tracer will travel to your heart. A camera will then take pictures of your heart to see how the tracer moves through your heart. This test is usually done at a hospital and takes 2-4 hours. Tell a doctor about: Any allergies you have. All medicines you are taking, including vitamins, herbs, eye drops, creams, and over-the-counter medicines. Any problems you or family members have had with anesthetic medicines. Any blood disorders you have. Any surgeries you have had. Any medical conditions you have. Whether you are pregnant or may be pregnant. What are the risks? Generally, this is a safe test. However, problems may occur, such as: Serious chest pain and heart attack. This is only a risk if the stress portion of the test is done. Rapid heartbeat. A feeling of warmth in your chest. This feeling usually does not last long. Allergic reaction to the tracer. What happens before the test? Ask your doctor about changing or stopping your normal medicines. This is important. Follow instructions from your doctor about what you cannot eat or drink. Remove your jewelry on the day of the test. What happens during the test? An IV tube will be inserted into one of your veins. Your doctor will give you a small amount of tracer through the IV tube. You will wait for 20-40 minutes while  the tracer moves through your bloodstream. Your heart will be monitored with an electrocardiogram (ECG). You will lie down on an exam table. Pictures of your heart will be taken for about 15-20 minutes. You may also have a stress test. For this test, one of these things may be done: You will be asked to exercise on a treadmill or a stationary bike. You will be given medicines that will make your heart work harder. This is done if you are unable to exercise. When blood flow to your heart has peaked, a tracer will again be given through the IV tube. After 20-40 minutes, you will get back on the exam table. More pictures will be taken of your heart. Depending on the tracer that is used, more pictures may need to be taken 3-4 hours later. Your IV tube will be removed when the test is over. The test may vary among doctors and hospitals. What happens after the test? Ask your doctor: Whether you can return to your normal schedule, including diet, activities, and medicines. Whether you should drink more fluids. This will help to remove the tracer from your  body. Drink enough fluid to keep your pee (urine) pale yellow. Ask your doctor, or the department that is doing the test: When will my results be ready? How will I get my results? Summary A cardiac nuclear scan is a test that is done to check the flow of blood to your heart. Tell your doctor whether you are pregnant or may be pregnant. Before the test, ask your doctor about changing or stopping your normal medicines. This is important. Ask your doctor whether you can return to your normal activities. You may be asked to drink more fluids. This information is not intended to replace advice given to you by your health care provider. Make sure you discuss any questions you have with your health care provider. Document Revised: 03/04/2019 Document Reviewed: 04/28/2018 Elsevier Patient Education  2021 Elsevier Inc.       Follow-Up: At [facility], you and your health needs are our priority.  As part of our continuing mission to provide you with exceptional heart care, we have created designated Provider Care Teams.  These Care Teams include your primary Cardiologist (physician) and Advanced Practice Providers (APPs -  Physician Assistants and Nurse Practitioners) who all work together to provide you with the care you need, when you need it.  We recommend signing up for the patient portal called "MyChart".  Sign up information is provided on this After Visit Summary.  MyChart is used to connect with patients for Virtual Visits (Telemedicine).  Patients are able to view lab/test results, encounter notes, upcoming appointments, etc.  Non-urgent messages can be sent to your provider as well.   To learn more about what you can do with MyChart, go to [email or web address].    Your next appointment:   5 month(s)  The format for your next appointment:   In Person  Provider:   Gypsy Balsam, MD    Other Instructions None

## 2022-01-30 NOTE — Progress Notes (Signed)
Cardiology Office Note:    Date:  01/30/2022   ID:  Steven Ford, DOB 1961-01-16, MRN [id number]  PCP:  Carlean Jews, NP  Cardiologist:  Gypsy Balsam, MD    Referring MD: Carlean Jews, NP   No chief complaint on file. I need to left hip replacement surgery  History of Present Illness:    Steven Ford is a 61 y.o. male   with past medical history significant for coronary artery disease, status post coronary artery bypass graft years ago, cardiomyopathy with ejection fraction labral of 20%, peripheral vascular disease status post arthrectomy done on left SFA IM in March 2021 but then he required 3 intervention the summer at that time laser was performed and drug-eluting stent was implanted.  He does have dyslipidemia. Recently he was finding of significant arthritis of the left hip with a lot of pain and decision has been made to pursue surgical intervention.  Overall cardiac wise like always he is doing great but obviously his exercise capacity is severely limited probably because of pain he still does upper body exercises but his hip is very painful.  He did see orthopedic surgeons already and surgery is scheduled.  Denies have any cardiac complaints of the problem is his ability to exercise is limited.  Past Medical History:  Diagnosis Date   Abnormal echocardiogram 02/12/2020   Echo 12/21/2019 shower decreased EF of 40%, grade 2DD, with global hypokinesia. Stress Myoview did not show evidence of ischemia.    Claudication in peripheral vascular disease (HCC) 07/15/2018   Peripheral arterial disease   Coronary artery disease    Coronary artery disease involving native coronary artery of native heart without angina pectoris 11/01/2015   Dyslipidemia 11/01/2015   Hyperlipidemia   Encounter to establish care 03/07/2021   High cholesterol    Hyperlipidemia    Hypertension    Migraine    "none in a long long while" (09/04/2018)   Preop cardiovascular exam 12/11/2019    Primary osteoarthritis of right knee 03/07/2021   PVD (peripheral vascular disease) (HCC)    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    Past Surgical History:  Procedure Laterality Date   ABDOMINAL AORTOGRAM W/LOWER EXTREMITY  02/18/2020   ABDOMINAL AORTOGRAM W/LOWER EXTREMITY    ABDOMINAL AORTOGRAM W/LOWER EXTREMITY Right 02/18/2020   Procedure: ABDOMINAL AORTOGRAM W/LOWER EXTREMITY;  Surgeon: Runell Gess, MD;  Location: [facility] IN[facility]IVE [facility];  Service: Cardiovascular;  Laterality: Right;   ABDOMINAL AORTOGRAM W/LOWER EXTREMITY Right 05/23/2020   Procedure: ABDOMINAL AORTOGRAM W/LOWER EXTREMITY;  Surgeon: Runell Gess, MD;  Location: [facility];  Service: Cardiovascular;  Laterality: Right;   CORONARY ARTERY BYPASS GRAFT  2002   "triple"   KNEE ARTHROSCOPY Right 2019   LOWER EXTREMITY ANGIOGRAPHY N/A 08/28/2018   Procedure: LOWER EXTREMITY ANGIOGRAPHY;  Surgeon: Runell Gess, MD;  Location: [facility];  Service: Cardiovascular;  Laterality: N/A;   LOWER EXTREMITY ANGIOGRAPHY N/A 09/04/2018   Procedure: LOWER EXTREMITY ANGIOGRAPHY;  Surgeon: Runell Gess, MD;  Location: [facility];  Service: Cardiovascular;  Laterality: N/A;   PERIPHERAL VASCULAR ATHERECTOMY  02/18/2020   Procedure: PERIPHERAL VASCULAR ATHERECTOMY;  Surgeon: Runell Gess, MD;  Location[facility] INV[facility] CV LAB;  Service: Cardiovascular;;  rt SFA   PERIPHERAL VASCULAR ATHERECTOMY  05/23/2020   Procedure: PERIPHERAL VASCULAR ATHERECTOMY;  Surgeon: Runell Gess, MD;  Location: [facility] INVASIVE CV LAB;  Service: Cardiovascular;;  Laser-Left SFA   PERIPHERAL VASCULAR BALLOON ANGIOPLASTY  05/23/2020   Procedure: PERIPHERAL VASCULAR BALLOON ANGIOPLASTY;  Surgeon: Runell Gess, MD;  Location: [facility];  Service: Cardiovascular;;  Left SFA   PERIPHERAL VASCULAR INTERVENTION Left 09/04/2018   PERIPHERAL VASCULAR INTERVENTION  Left 09/04/2018   Procedure: PERIPHERAL VASCULAR INTERVENTION;  Surgeon: Runell Gess, MD;  Location: [facility];  Service: Cardiovascular;  Laterality: Left;   PERIPHERAL VASCULAR INTERVENTION  02/18/2020   Procedure: PERIPHERAL VASCULAR INTERVENTION;  Surgeon: Runell Gess, MD;  Location: [facility];  Service: Cardiovascular;;  rt SFA    Current Medications: Current Meds  Medication Sig   aspirin EC 81 MG tablet Take 81 mg by mouth daily.   atorvastatin (LIPITOR) 40 MG tablet TAKE 1 TABLET (40 MG TOTAL) BY MOUTH DAILY AT 6 PM.   Multiple Vitamins-Minerals (MULTIVITAMIN WITH MINERALS) tablet Take 1 tablet by mouth daily. unknown STRENGTH   sacubitril-valsartan (ENTRESTO) 24-26 MG Take 1 tablet by mouth 2 (two) times daily.   Current Facility-Administered Medications for the 01/30/22 encounter (Office Visit) with Georgeanna Lea, MD  Medication   sodium chloride flush (NS) 0.9 % injection 3 mL     Allergies:   Influenza vaccines   Social History   Socioeconomic History   Marital status: Married    Spouse name: Not on file   Number of children: Not on file   Years of education: Not on file   Highest education level: Not on file  Occupational History   Not on file  Tobacco Use   Smoking status: Light Smoker    Types: Cigars   Smokeless tobacco: Never   Tobacco comments:    09/04/2018 "q time I play golf"  Vaping Use   Vaping Use: Never used  Substance and Sexual Activity   Alcohol use: Yes    Alcohol/week: 2.0 standard drinks    Types: 2 Cans of beer per week   Drug use: Not Currently   Sexual activity: Yes    Partners: Female  Other Topics Concern   Not on file  Social History Narrative   Not on file   Social Determinants of Health   Financial Resource Strain: Not on file  Food Insecurity: Not on file  Transportation Needs: Not on file  Physical Activity: Not on file  Stress: Not on file  Social Connections: Not on file     Family  History: The patient's family history includes Anemia in his father; Heart disease in his mother; High blood pressure in his mother. ROS:   Please see the history of present illness.    All 14 point review of systems negative except as described per history of present illness  EKGs/Labs/Other Studies Reviewed:      Recent Labs: 06/01/2021: ALT 33; BUN 17; Creatinine, Ser 0.85; Hemoglobin 14.6; Platelets 291; Potassium 4.1; Sodium 137; TSH 1.970  Recent Lipid Panel    Component Value Date/Time   CHOL 134 06/01/2021 1019   TRIG 54 06/01/2021 1019   HDL 43 06/01/2021 1019   CHOLHDL 3.1 06/01/2021 1019   LDLCALC 79 06/01/2021 1019    Physical Exam:    VS:  BP 122/70    Pulse 64    Ht 5' 10.5" (1.791 m)    Wt 200 lb (90.7 kg)    SpO2 94%    BMI 28.29 kg/m     Wt Readings from Last 3 Encounters:  01/30/22 200 lb (90.7 kg)  06/01/21  198 lb 11.2 oz (90.1 kg)  04/12/21 199 lb (90.3 kg)     GEN:  Well nourished, well developed in no acute distress HEENT: Normal NECK: No JVD; No carotid bruits LYMPHATICS: No lymphadenopathy CARDIAC: RRR, no murmurs, no rubs, no gallops RESPIRATORY:  Clear to auscultation without rales, wheezing or rhonchi  ABDOMEN: Soft, non-tender, non-distended MUSCULOSKELETAL:  No edema; No deformity  SKIN: Warm and dry LOWER EXTREMITIES: no swelling NEUROLOGIC:  Alert and oriented x 3 PSYCHIATRIC:  Normal affect   ASSESSMENT:    1. Coronary artery disease involving native coronary artery of native heart without angina pectoris   2. PVD (peripheral vascular disease) (HCC)   3. Primary hypertension   4. Status post coronary artery bypass graft   5. Dyslipidemia   6. Preop cardiovascular exam    PLAN:    In order of problems listed above:  Coronary artery disease status post coronary artery bypass graft 21 years ago.  Still doing well.  However with lack of exercises in the lower body I think it would be reasonable to perform a stress test.  We will do  Lexiscan to make sure he does not have any inducible ischemia.  Otherwise we will continue present medication which include antiplatelets therapy. Cardiomyopathy.  Last echocardiogram showed ejection fraction Nebido 40%.  He is on Entresto which I will continue we will check Chem-7 today if Chem-7 is fine we will increase dose of Entresto.  We will also consider adding beta-blocker. Dyslipidemia I did review his K PN which show LDL of 79 HDL 43 this is from 06/01/2021.  We will recheck the test. History of peripheral vascular disease.  Doing well from that point review.  I will schedule him to have arterial duplex evaluation of lower extremities   Medication Adjustments/Labs and Tests Ordered: Current medicines are reviewed at length with the patient today.  Concerns regarding medicines are outlined above.  No orders of the defined types were placed in this encounter.  Medication changes: No orders of the defined types were placed in this encounter.   Signed, Georgeanna Lea, MD, [facility] 01/30/2022 2:35 PM    [facility]

## 2022-02-01 ENCOUNTER — Telehealth: Payer: Self-pay

## 2022-02-01 NOTE — Telephone Encounter (Signed)
Spoke with the patient, detailed instructions left with the patient. He stated that he would be here for his test. Asked to call back with any questions. S.Madysen Faircloth EMTP 

## 2022-02-03 LAB — BASIC METABOLIC PANEL
BUN/Creatinine Ratio: 17 (ref 10–24)
BUN: 14 mg/dL (ref 8–27)
CO2: 25 mmol/L (ref 20–29)
Calcium: 8.8 mg/dL (ref 8.6–10.2)
Chloride: 106 mmol/L (ref 96–106)
Creatinine, Ser: 0.83 mg/dL (ref 0.76–1.27)
Glucose: 112 mg/dL — ABNORMAL HIGH (ref 70–99)
Potassium: 4.3 mmol/L (ref 3.5–5.2)
Sodium: 142 mmol/L (ref 134–144)
eGFR: 100 mL/min/{1.73_m2} (ref >59)

## 2022-02-03 LAB — LDL CHOLESTEROL, DIRECT: LDL Direct: 79 mg/dL (ref 0–99)

## 2022-02-06 NOTE — Pre-Procedure Instructions (Signed)
Surgical Instructions ? ? ? Your procedure is scheduled on Friday, March 24. ? Report to [facility] Main Entrance "A" at 5:30 A.M., then check in with the Admitting office. ? Call this number if you have problems the morning of surgery: ? [phone] ? ? If you have any questions prior to your surgery date call [phone]: Open Monday-Friday 8am-4pm ? ? ? Remember: ? Do not eat after midnight the night before your surgery ? ?You may drink clear liquids until 4:30 AM the morning of your surgery.   ?Clear liquids allowed are: Water, Non-Citrus Juices (without pulp), Carbonated Beverages, Clear Tea, Black Coffee ONLY (NO MILK, CREAM OR POWDERED CREAMER of any kind), and Gatorade ? ?Patient Instructions ? ?The night before surgery:  ?No food after midnight. ONLY clear liquids after midnight ? ?The day of surgery (if you do NOT have diabetes):  ?Drink ONE (1) Pre-Surgery Clear Ensure by 4:30AM the morning of surgery. Drink in one sitting. Do not sip.  ?This drink was given to you during your hospital  ?pre-op appointment visit. ? ?Nothing else to drink after completing the  ?Pre-Surgery Clear Ensure. ? ? ?       If you have questions, please contact your surgeon?s office. ? ? ?  ? Take these medicines the morning of surgery with A SIP OF WATER:  ? ?atorvastatin (LIPITOR)  ?acetaminophen (TYLENOL)  ? ?As of today, STOP taking any Aspirin (unless otherwise instructed by your surgeon) Aleve, Naproxen, Ibuprofen, Motrin, Advil, Goody's, BC's, all herbal medications, fish oil, and all vitamins. ? ?         ?Do not wear jewelry or makeup ?Do not wear lotions, powders, perfumes/colognes, or deodorant. ?Do not shave 48 hours prior to surgery.  Men may shave face and neck. ?Do not bring valuables to the hospital. ?Do not wear nail polish, gel polish, artificial nails, or any other type of covering on natural nails (fingers and toes) ?If you have artificial nails or gel coating that need to be removed by a nail salon, please  have this removed prior to surgery. Artificial nails or gel coating may interfere with anesthesia's ability to adequately monitor your vital signs. ? ?[facility] is not responsible for any belongings or valuables. .  ? ?Do NOT Smoke (Tobacco/Vaping)  24 hours prior to your procedure ? ?If you use a CPAP at night, you may bring your mask for your overnight stay. ?  ?Contacts, glasses, hearing aids, dentures or partials may not be worn into surgery, please bring cases for these belongings ?  ?For patients admitted to the hospital, discharge time will be determined by your treatment team. ?  ?Patients discharged the day of surgery will not be allowed to drive home, and someone needs to stay with them for 24 hours. ? ?NO VISITORS WILL BE ALLOWED IN PRE-OP WHERE PATIENTS ARE PREPPED FOR SURGERY.  ONLY 1 SUPPORT PERSON MAY BE PRESENT IN THE WAITING ROOM WHILE YOU ARE IN SURGERY.  IF YOU ARE TO BE ADMITTED, ONCE YOU ARE IN YOUR ROOM YOU WILL BE ALLOWED TWO (2) VISITORS. 1 (ONE) VISITOR MAY STAY OVERNIGHT BUT MUST ARRIVE TO THE ROOM BY 8pm.  Minor children may have two parents present. Special consideration for safety and communication needs will be reviewed on a case by case basis. ? ?Special instructions:   ? ?Oral Hygiene is also important to reduce your risk of infection.  Remember - BRUSH YOUR TEETH THE MORNING OF SURGERY WITH YOUR REGULAR TOOTHPASTE ? ? ?  [facility]- Preparing For Surgery ? ?Before surgery, you can play an important role. Because skin is not sterile, your skin needs to be as free of germs as possible. You can reduce the number of germs on your skin by washing with CHG (chlorahexidine gluconate) Soap before surgery.  CHG is an antiseptic cleaner which kills germs and bonds with the skin to continue killing germs even after washing.   ? ? ?Please do not use if you have an allergy to CHG or antibacterial soaps. If your skin becomes reddened/irritated stop using the CHG.  ?Do not shave (including legs  and underarms) for at least 48 hours prior to first CHG shower. It is OK to shave your face. ? ?Please follow these instructions carefully. ?  ? ? Shower the NIGHT BEFORE SURGERY and the MORNING OF SURGERY with CHG Soap.  ? If you chose to wash your hair, wash your hair first as usual with your normal shampoo. After you shampoo, rinse your hair and body thoroughly to remove the shampoo.  Then [employer] and genitals (private parts) with your normal soap and rinse thoroughly to remove soap. ? ?After that Use CHG Soap as you would any other liquid soap. You can apply CHG directly to the skin and wash gently with a scrungie or a clean washcloth.  ? ?Apply the CHG Soap to your body ONLY FROM THE NECK DOWN.  Do not use on open wounds or open sores. Avoid contact with your eyes, ears, mouth and genitals (private parts). Wash Face and genitals (private parts)  with your normal soap.  ? ?Wash thoroughly, paying special attention to the area where your surgery will be performed. ? ?Thoroughly rinse your body with warm water from the neck down. ? ?DO NOT shower/wash with your normal soap after using and rinsing off the CHG Soap. ? ?Pat yourself dry with a CLEAN TOWEL. ? ?Wear CLEAN PAJAMAS to bed the night before surgery ? ?Place CLEAN SHEETS on your bed the night before your surgery ? ?DO NOT SLEEP WITH PETS. ? ? ?Day of Surgery: ? ?Take a shower with CHG soap. ?Wear Clean/Comfortable clothing the morning of surgery ?Do not apply any deodorants/lotions.   ?Remember to brush your teeth WITH YOUR REGULAR TOOTHPASTE. ? ? ? ?COVID testing ? ?If you are going to stay overnight or be admitted after your procedure/surgery and require a pre-op COVID test, please follow these instructions after your COVID test  ? ?You are not required to quarantine however you are required to wear a well-fitting mask when you are out and around people not in your household.  If your mask becomes wet or soiled, replace with a new one. ? ?Wash your  hands often with soap and water for 20 seconds or clean your hands with an alcohol-based hand sanitizer that contains at least 60% alcohol. ? ?Do not share personal items. ? ?Notify your provider: ?if you are in close contact with someone who has COVID  ?or if you develop a fever of 100.4 or greater, sneezing, cough, sore throat, shortness of breath or body aches. ? ?  ?Please read over the following fact sheets that you were given.  ? ?

## 2022-02-07 ENCOUNTER — Other Ambulatory Visit: Payer: Self-pay

## 2022-02-07 ENCOUNTER — Encounter (HOSPITAL_COMMUNITY)
Admission: RE | Admit: 2022-02-07 | Discharge: 2022-02-07 | Disposition: A | Discharge status: Home / Self Care | Payer: BC Managed Care – PPO | Source: Ambulatory Visit | Attending: Orthopedic Surgery | Admitting: Orthopedic Surgery

## 2022-02-07 ENCOUNTER — Encounter (HOSPITAL_COMMUNITY): Payer: Self-pay

## 2022-02-07 ENCOUNTER — Other Ambulatory Visit (HOSPITAL_COMMUNITY): Payer: BC Managed Care – PPO

## 2022-02-07 VITALS — BP 137/85 | HR 64 | Temp 98.1°F | Resp 17 | Ht 70.5 in | Wt 199.0 lb

## 2022-02-07 DIAGNOSIS — Z01812 Encounter for preprocedural laboratory examination: Secondary | ICD-10-CM | POA: Insufficient documentation

## 2022-02-07 LAB — CBC
HCT: 44.3 % (ref 39.0–52.0)
Hemoglobin: 14.4 g/dL (ref 13.0–17.0)
MCH: 30.1 pg (ref 26.0–34.0)
MCHC: 32.5 g/dL (ref 30.0–36.0)
MCV: 92.7 fL (ref 80.0–100.0)
Platelets: 265 10*3/uL (ref 150–400)
RBC: 4.78 MIL/uL (ref 4.22–5.81)
RDW: 13.6 % (ref 11.5–15.5)
WBC: 6.1 10*3/uL (ref 4.0–10.5)
nRBC: 0 % (ref 0.0–0.2)

## 2022-02-07 NOTE — Progress Notes (Signed)
PCP - Carlean Jews NP ?Cardiologist - Gypsy Balsam MD ? ?PPM/ICD - denies ?Device Orders -  ?Rep Notified -  ? ?Chest x-ray - na ?EKG - 01/30/22 ?Stress Test - 02/08/21 ?ECHO - 05/26/21 ?Cardiac Cath -  ? ?Sleep Study - denies ?CPAP -  ? ?Fasting Blood Sugar - n/a ?Checks Blood Sugar _____ times a day ? ?Blood Thinner Instructions:na ?Aspirin Instructions:pt states he will contact Dr. Kyra Leyland office for instructions regarding asa.  ? ?ERAS Protcol -clears until 0430 ?PRE-SURGERY Ensure or G2- Ensure ? ?COVID TEST- na -ambulatory surgery ? ? ?Anesthesia review: yes-cardiac history ? ?Patient denies shortness of breath, fever, cough and chest pain at PAT appointment ? ? ?All instructions explained to the patient, with a verbal understanding of the material. Patient agrees to go over the instructions while at home for a better understanding. Patient also instructed to self quarantine after being tested for COVID-19. The opportunity to ask questions was provided. ?  ?

## 2022-02-08 ENCOUNTER — Ambulatory Visit (INDEPENDENT_AMBULATORY_CARE_PROVIDER_SITE_OTHER): Payer: BC Managed Care – PPO

## 2022-02-08 ENCOUNTER — Telehealth: Payer: Self-pay

## 2022-02-08 DIAGNOSIS — R079 Chest pain, unspecified: Secondary | ICD-10-CM

## 2022-02-08 DIAGNOSIS — I739 Peripheral vascular disease, unspecified: Secondary | ICD-10-CM | POA: Diagnosis not present

## 2022-02-08 LAB — MYOCARDIAL PERFUSION IMAGING
LV dias vol: 205 mL (ref 62–150)
LV sys vol: 124 mL
Nuc Stress EF: 39 %
Peak HR: 81 {beats}/min
Rest HR: 56 {beats}/min
Rest Nuclear Isotope Dose: 10.6 mCi
SDS: 0
SRS: 2
SSS: 2
ST Depression (mm): 0 mm
Stress Nuclear Isotope Dose: 31.5 mCi
TID: 1.12

## 2022-02-08 LAB — SURGICAL PCR SCREEN
MRSA, PCR: NEGATIVE
Staphylococcus aureus: NEGATIVE

## 2022-02-08 MED ORDER — ATORVASTATIN CALCIUM 80 MG PO TABS: 80.0000 mg | ORAL_TABLET | Freq: Every day | ORAL | 1 refills | Status: DC

## 2022-02-08 MED ORDER — TECHNETIUM TC 99M TETROFOSMIN IV KIT
32.5000 | PACK | Freq: Once | INTRAVENOUS | Status: AC | PRN
Start: 2022-02-08 — End: 2022-02-08
  Administered 2022-02-08: 31.5 via INTRAVENOUS

## 2022-02-08 MED ORDER — AMINOPHYLLINE 25 MG/ML IV SOLN
75.0000 mg | Freq: Once | INTRAVENOUS | Status: AC
  Administered 2022-02-08: 75 mg via INTRAVENOUS

## 2022-02-08 MED ORDER — REGADENOSON 0.4 MG/5ML IV SOLN
0.4000 mg | Freq: Once | INTRAVENOUS | Status: AC
  Administered 2022-02-08: 0.4 mg via INTRAVENOUS

## 2022-02-08 MED ORDER — TECHNETIUM TC 99M TETROFOSMIN IV KIT
10.6000 | PACK | Freq: Once | INTRAVENOUS | Status: AC | PRN
  Administered 2022-02-08: 10.6 via INTRAVENOUS

## 2022-02-08 NOTE — Telephone Encounter (Signed)
-----   Message from Georgeanna Lea, MD sent at 02/06/2022  8:47 AM EDT ----- ?Cholesterol still not sufficiently controlled, we have 2 options either increase Lipitor to 80 mg daily or continue 40 mg of Lipitor daily and Zetia 10 mg daily.  6 weeks later he need to have fasting lipid profile, AST ALT ?

## 2022-02-08 NOTE — Telephone Encounter (Signed)
Patient notified of results and recommendations and he choose to start with increasing Lipitor to 80qd. He will double up on what he currently takes and he is aware new script will have the new strength. Aware to be fasting for labs. Patient also asked for the status of clearance request for upcoming surgery next week. Message sent to Dr. Bing Matter.  ?

## 2022-02-09 ENCOUNTER — Encounter (HOSPITAL_COMMUNITY): Payer: Self-pay | Admitting: Emergency Medicine

## 2022-02-09 ENCOUNTER — Encounter (HOSPITAL_COMMUNITY): Payer: Self-pay

## 2022-02-09 NOTE — Progress Notes (Signed)
Anesthesia Chart Review: ? ? Case: [id number] Date/Time: 02/16/22 0715  ? Procedure: TOTAL HIP ARTHROPLASTY (Left: Hip)  ? Anesthesia type: Choice  ? Pre-op diagnosis: OSTEOARTHRITIS LEFT HIP  ? Location: [facility] ROOM 04 / [facility] OR  ? Surgeons: Joen Laura, MD  ? ?  ? ? ?DISCUSSION: ?Pt is 61 years old with hx CAD (s/p CABG 2002), cardiomyopathy (EF 40-45%), HTN, PVD ( s/p L SFA intervention/stent placement 2019) ? ?VS: BP 137/85   Pulse 64   Temp 36.7 ?C (Oral)   Resp 17   Ht 5' 10.5" (1.791 m)   Wt 90.3 kg   SpO2 97%   BMI 28.15 kg/m?  ? ?PROVIDERS: ?- PCP is Carlean Jews, NP ?- Cardiologist is Gypsy Balsam, MD. Last office visit 01/30/22 ? ? ?LABS: Labs reviewed: Acceptable for surgery. ?- BMP 02/02/22: glucose 112 ? ?(all labs ordered are listed, but only abnormal results are displayed) ? ?Labs Reviewed  ?SURGICAL PCR SCREEN  ?CBC  ? ? ?EKG 01/30/22: NSR. T wave abnormality, consider inferolateral ischemia.  ? ? ?CV: ?Nuclear stress test 02/08/22:  ?  Findings are consistent with no ischemia and no prior myocardial infarction. The study is intermediate risk. ?  No ST deviation was noted. ?  Left ventricular function is abnormal. Global function is moderately reduced. Nuclear stress EF: 39 %. The left ventricular ejection fraction is moderately decreased (30-44%). End diastolic cavity size is normal. ?  Prior study available for comparison from 02/12/2020. ? ?[place] lower extremity 02/08/22:  ?- Right: 50-74% stenosis noted in the superficial femoral artery and/or popliteal artery. Diffuse atherosclerosis. Patent right mid SFA-distal SFA/Pop A stent without evidence of focal stenosis within the stent.  ?- Left: Heterogenous plaque throughout. Moderate progression is noted when compared to previous study. The velocities in the mid stent segment are elevated and have increased suggesting 50-99% (closer to 99%) in-stent restenosis.  ? ?Echo 05/26/21:  ?1. Left ventricular ejection fraction, by estimation, is 40 to  45%. The left ventricle has mildly decreased function. The left ventricle demonstrates global hypokinesis. There is mild concentric left ventricular hypertrophy. Left ventricular diastolic parameters are consistent with Grade II diastolic dysfunction (pseudonormalization). The average left ventricular global longitudinal strain is -12.5 %. The global longitudinal strain is abnormal.  ?2. Right ventricular systolic function is normal. The right ventricular size is normal. There is normal pulmonary artery systolic pressure.  ?3. Left atrial size was mildly dilated.  ?4. Right atrial size was moderately dilated.  ?5. The mitral valve is normal in structure. Trivial mitral valve regurgitation. No evidence of mitral stenosis.  ?6. The aortic valve is normal in structure. Aortic valve regurgitation is not visualized. No aortic stenosis is present.  ?7. Abdominal aorta is mildly dilated (2.3 cm).  ?8. The inferior vena cava is normal in size with greater than 50% respiratory variability, suggesting right atrial pressure of 3 mmHg.  ? ?Past Medical History:  ?Diagnosis Date  ? Cardiomyopathy (HCC) 02/12/2020  ? Echo 12/21/2019 showed decreased EF of 40%, grade 2DD, with global hypokinesia. Stress Myoview did not show evidence of ischemia.  ? Claudication in peripheral vascular disease (HCC) 07/15/2018  ? Peripheral arterial disease  ? Coronary artery disease   ? Coronary artery disease involving native coronary artery of native heart without angina pectoris 11/01/2015  ? Dyslipidemia 11/01/2015  ? Hyperlipidemia  ? Encounter to establish care 03/07/2021  ? High cholesterol   ? Hyperlipidemia   ? Hypertension   ? Migraine   ? "  none in a long long while" (09/04/2018)  ? Preop cardiovascular exam 12/11/2019  ? Primary osteoarthritis of right knee 03/07/2021  ? PVD (peripheral vascular disease) (HCC)   ? 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  ? 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 ? ?Past Surgical  History:  ?Procedure Laterality Date  ? ABDOMINAL AORTOGRAM W/LOWER EXTREMITY  02/18/2020  ? ABDOMINAL AORTOGRAM W/LOWER EXTREMITY   ? ABDOMINAL AORTOGRAM W/LOWER EXTREMITY Right 02/18/2020  ? Procedure: ABDOMINAL AORTOGRAM W/LOWER EXTREMITY;  Surgeon: Runell Gess, MD;  Location: [facility] INVASIVE CV LAB;  Service: Cardiovascular;  Laterality: Right;  ? ABDOMINAL AORTOGRAM W/LOWER EXTREMITY Right 05/23/2020  ? Procedure: ABDOMINAL AORTOGRAM W/LOWER EXTREMITY;  Surgeon: Runell Gess, MD;  Location: [facility] INVASIVE CV LAB;  Service: Cardiovascular;  Laterality: Right;  ? ACHILLES TENDON REPAIR Right 2010  ? CORONARY ARTERY BYPASS GRAFT  2002  ? "triple"  ? KNEE ARTHROSCOPY Right 2019  ? LOWER EXTREMITY ANGIOGRAPHY N/A 08/28/2018  ? Procedure: LOWER EXTREMITY ANGIOGRAPHY;  Surgeon: Runell Gess, MD;  Location: [facility] INVASIVE CV LAB;  Service: Cardiovascular;  Laterality: N/A;  ? LOWER EXTREMITY ANGIOGRAPHY N/A 09/04/2018  ? Procedure: LOWER EXTREMITY ANGIOGRAPHY;  Surgeon: Runell Gess, MD;  Location: [facility] INVASIVE CV LAB;  Service: Cardiovascular;  Laterality: N/A;  ? PERIPHERAL VASCULAR ATHERECTOMY  02/18/2020  ? Procedure: PERIPHERAL VASCULAR ATHERECTOMY;  Surgeon: Runell Gess, MD;  Location: [facility] INVASIVE CV LAB;  Service: Cardiovascular;;  rt SFA  ? PERIPHERAL VASCULAR ATHERECTOMY  05/23/2020  ? Procedure: PERIPHERAL VASCULAR ATHERECTOMY;  Surgeon: Runell Gess, MD;  Location: [facility] INVASIVE CV LAB;  Service: Cardiovascular;;  Laser-Left SFA  ? PERIPHERAL VASCULAR BALLOON ANGIOPLASTY  05/23/2020  ? Procedure: PERIPHERAL VASCULAR BALLOON ANGIOPLASTY;  Surgeon: Runell Gess, MD;  Location: [facility] INVASIVE CV LAB;  Service: Cardiovascular;;  Left SFA  ? PERIPHERAL VASCULAR INTERVENTION Left 09/04/2018  ? PERIPHERAL VASCULAR INTERVENTION Left 09/04/2018  ? Procedure: PERIPHERAL VASCULAR INTERVENTION;  Surgeon: Runell Gess, MD;  Location: [facility] INVASIVE CV LAB;  Service: Cardiovascular;  Laterality:  Left;  ? PERIPHERAL VASCULAR INTERVENTION  02/18/2020  ? Procedure: PERIPHERAL VASCULAR INTERVENTION;  Surgeon: Runell Gess, MD;  Location: [facility] INVASIVE CV LAB;  Service: Cardiovascular;;  rt SFA  ? ? ?MEDICATIONS: ? acetaminophen (TYLENOL) 500 MG tablet  ? aspirin EC 81 MG tablet  ? atorvastatin (LIPITOR) 80 MG tablet  ? Multiple Vitamins-Minerals (MULTIVITAMIN WITH MINERALS) tablet  ? sacubitril-valsartan (ENTRESTO) 24-26 MG  ? ? sodium chloride flush (NS) 0.9 % injection 3 mL  ? ? ?Rica Mast, PhD, FNP-BC ?[facility] Stay [facility]/Anesthesiology ?Phone: [phone] ?02/09/2022 4:04 PM  ? ? ? ? ? ?

## 2022-02-13 ENCOUNTER — Telehealth: Payer: Self-pay | Admitting: Cardiology

## 2022-02-13 NOTE — Telephone Encounter (Signed)
Called the patient and explained that his surgeon's office needed to fax over the questions they have regarding his preop clearance. Then we would send it over to the preop clearance team for them to review the patients chart and give the cardiac clearance for him to have surgery. Patient stated that he was under the impression Dr. Vanetta Shawl office and the surgeon's office were already communicating. He also stated that he would call the surgeon and have them fax over the information they need. ?

## 2022-02-13 NOTE — Telephone Encounter (Signed)
Patient's surgeon told him he needed to speak with Dr. Kirtland Bouchard regarding his clearance. Surgery is Friday. ? ?Please call asap ? ?Thank you ?

## 2022-02-13 NOTE — Telephone Encounter (Signed)
Dr. Agustin Cree ?You saw this patient recently on 01/30/22. Is he cleared for surgery and ASA hold? ?

## 2022-02-13 NOTE — Telephone Encounter (Signed)
? ?  Pre-operative Risk Assessment  ?  ?Patient Name: ARIZ TERRONES  ?DOB: 12/07/1960 ?MRN: [id number]  ? ?  ? ?Request for Surgical Clearance   ? ?Procedure:   Left Total Hip Replacement  ? ?Date of Surgery:  Clearance 02/16/22                              ?   ?Surgeon:  Dr. Blanchie Dessert  ?Surgeon's Group or Practice Name:  [facility] ?Phone number:  [phone] ?Fax number:  [phone] ?  ?Type of Clearance Requested:   ?- Medical  ?- Pharmacy:  Hold Aspirin 7 days prior, pt is already off medication. ?  ?Type of Anesthesia:  Spinal ?  ?Additional requests/questions:    Due to the doppler results from Friday Dr. Bing Matter advised Tresa Endo today that the pt is needing further clearance before procedure  ? ?Signed, ?Brooks Sailors   ?02/13/2022, 4:13 PM   ?

## 2022-02-14 ENCOUNTER — Telehealth: Payer: Self-pay

## 2022-02-14 ENCOUNTER — Telehealth: Payer: Self-pay | Admitting: Cardiovascular Disease

## 2022-02-14 NOTE — Telephone Encounter (Signed)
Spoke to patient he stated Dr.Krasowski ordered lower arterial dopplers which showed he has another blockage in left leg.Stated he needs a left hip replacement and surgery cannot be done until he gets blockage taken care of.Stated he needs to see Dr.Berry soon.Advised I will send message to Dr.Berry's RN to schedule appointment. ?

## 2022-02-14 NOTE — Telephone Encounter (Signed)
Steven Ford recently saw Dr. Bing Matter who sent him for LE Vas study.  Steven Ford states he has the same vein block again.   Steven Ford would like to know if he needs to come in for another visit with Dr. Allyson Sabal or can surgery be set up without him being seen. Please advise.  ?

## 2022-02-14 NOTE — Telephone Encounter (Signed)
Patient notified of results.

## 2022-02-14 NOTE — Telephone Encounter (Signed)
Office is calling back for update °

## 2022-02-14 NOTE — Telephone Encounter (Signed)
-----   Message from Georgeanna Lea, MD sent at 02/12/2022 10:04 AM EDT ----- ?Stress test showing no ischemia ?

## 2022-02-15 NOTE — Telephone Encounter (Signed)
Patient upset because he thought Dr. Allyson Sabal cleared him for surgery, but the patient's surgeon called patient and said he was not cleared for surgery, and that the surgery has been cancelled. Patient want's to know where the mix-up happened. ?

## 2022-02-15 NOTE — Telephone Encounter (Signed)
Spoke with pt regarding need to be seen by Dr. Allyson Sabal to address possible blockage noted in his most resent lower extremity doppler. Dr. Allyson Sabal reviewed dopplers with me and states that pt can have his left hip replaced without moving forward with left SFA intervention. Pt verbalizes understanding.  ?

## 2022-02-15 NOTE — Telephone Encounter (Signed)
Steven Ford calling back asking if we can call pt to explain why he can't be cleared for procedure. They were told by the pt that he was cleared by Dr. Allyson Sabal. ?

## 2022-02-15 NOTE — Telephone Encounter (Addendum)
Dr. Bing Matter has contacted surgeon and discussed patient's SFA which needs to be fixed.  I will remove patient from pulm and defer clearance to Dr. Bing Matter. ? ?[name]. Arsenia Goracke NP-C ? ?  ?02/15/2022, 7:57 AM ?[facility] ?[address] ?Office [phone] Fax [phone] ? ?

## 2022-02-16 ENCOUNTER — Ambulatory Visit (HOSPITAL_COMMUNITY)
Admission: RE | Admit: 2022-02-16 | Payer: BC Managed Care – PPO | Source: Home / Self Care | Admitting: Orthopedic Surgery

## 2022-02-16 ENCOUNTER — Other Ambulatory Visit: Payer: Self-pay

## 2022-02-16 ENCOUNTER — Encounter (HOSPITAL_COMMUNITY): Admission: RE | Payer: Self-pay | Source: Home / Self Care

## 2022-02-16 SURGERY — ARTHROPLASTY, HIP, TOTAL,POSTERIOR APPROACH
Anesthesia: Choice | Site: Hip | Laterality: Left

## 2022-02-16 NOTE — Telephone Encounter (Signed)
Called pt back to let him know that we are still waiting for Dr. Allyson Sabal to advise [place] on what to do moving forward. Told pt we will call him back when we hear back from Dr. Allyson Sabal. Pt verbalizes understanding.  ?

## 2022-02-16 NOTE — Telephone Encounter (Signed)
Patient following up. Would like to further discuss next steps to take to clear possible blockage. ?

## 2022-02-16 NOTE — Telephone Encounter (Signed)
Discussed this patient with Dr. Bing Matter and he will be contacting a vascular surgeon regarding this patients care. ?

## 2022-02-19 NOTE — Addendum Note (Signed)
Addended by: Bernita Buffy on: 02/19/2022 08:15 AM ? ? Modules accepted: Orders ? ?

## 2022-02-28 ENCOUNTER — Telehealth: Payer: Self-pay

## 2022-02-28 DIAGNOSIS — I739 Peripheral vascular disease, unspecified: Secondary | ICD-10-CM

## 2022-02-28 DIAGNOSIS — I2510 Atherosclerotic heart disease of native coronary artery without angina pectoris: Secondary | ICD-10-CM

## 2022-02-28 NOTE — Telephone Encounter (Signed)
Referral made to Dr. Allyson Sabal. Pt aware. ?

## 2022-03-07 ENCOUNTER — Other Ambulatory Visit: Payer: Self-pay | Admitting: Cardiology

## 2022-03-13 ENCOUNTER — Ambulatory Visit (INDEPENDENT_AMBULATORY_CARE_PROVIDER_SITE_OTHER): Payer: BC Managed Care – PPO | Admitting: Cardiovascular Disease

## 2022-03-13 ENCOUNTER — Encounter: Payer: Self-pay | Admitting: Cardiovascular Disease

## 2022-03-13 DIAGNOSIS — I739 Peripheral vascular disease, unspecified: Secondary | ICD-10-CM

## 2022-03-13 NOTE — Addendum Note (Signed)
Addended by: Bernita Buffy on: 03/13/2022 09:01 AM ? ? Modules accepted: Orders ? ?

## 2022-03-13 NOTE — Patient Instructions (Addendum)
Medication Instructions:  ?Your physician recommends that you continue on your current medications as directed. Please refer to the Current Medication list given to you today. ? ?*If you need a refill on your cardiac medications before your next appointment, please call your pharmacy* ? ? ?Testing/Procedures: ?Dr. Allyson Sabal has recommended that you have an Ultrasound of your AORTA/IVC/ILIACS.  ? ?To prepare for this test: ? ?No food after 11PM the night before. Water is OK. (Don't drink liquids if you have been instructed not to for ANOTHER test).  ?Avoid foods that produce bowel gas, for 24 hours prior to exam (see below). ?No breakfast, no chewing gum, no smoking or carbonated beverages. ?Patient may take morning medications with water. ?Come in for test at least 15 minutes early to register. ? ?Your physician has requested that you have an ankle brachial index (ABI). During this test an ultrasound and blood pressure cuff are used to evaluate the arteries that supply the arms and legs with blood. Allow thirty minutes for this exam. There are no restrictions or special instructions. ?These procedures will be done at [address] ? ?Follow-Up: ?At [facility], you and your health needs are our priority.  As part of our continuing mission to provide you with exceptional heart care, we have created designated Provider Care Teams.  These Care Teams include your primary Cardiologist (physician) and Advanced Practice Providers (APPs -  Physician Assistants and Nurse Practitioners) who all work together to provide you with the care you need, when you need it. ? ?We recommend signing up for the patient portal called "MyChart".  Sign up information is provided on this After Visit Summary.  MyChart is used to connect with patients for Virtual Visits (Telemedicine).  Patients are able to view lab/test results, encounter notes, upcoming appointments, etc.  Non-urgent messages can be sent to your provider as well.    ?To learn more about what you can do with MyChart, go to [email or web address].   ? ?Your next appointment:   ?6 month(s) ? ?The format for your next appointment:   ?In Person ? ?Provider:   ?Nanetta Batty, MD ? ?

## 2022-03-13 NOTE — Progress Notes (Signed)
? ? ? ?03/13/2022 ?Steven Ford   ?[birth year]  ?[id number] ? ?Primary Physician Carlean Jews, NP ?Primary Cardiologist: Runell Gess MD Nicholes Calamity, [place] ? ?HPI:  Steven Ford is a 61 y.o.  married, father of 2 boys, who referred to our cardiology clinic due to abnormal ABI.  I last saw him in the office 01/03/2021.  He has Hx orf premature CV disease: CAD s/o CABG 2002 and L. SFA intervention/stent placement 2019. He was supposed to have a rt knee surgery, and was seen by his cardiologist for pre-op eval.  Lower extremities US showed decreased right ABI and he was referred to Dr. Allyson Sabal for evaluation for intervenention. 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 He denies any claudication. No C.P, no DOE, no palpitation. Of note, recent echo 12/21/2019 shower decreased EF of 40%, grade 2DD,  with global hypokinesia. Stress Myoview did not show evidence of ischemia.  ?  ?I performed peripheral angiography on him 02/18/2020 revealing 80% diffuse in-stent restenosis within the previously placed left SFA stent 2 years ago.  I atherectomized and performed drug coated self-expanding stenting of 99% diffuse calcified right SFA stenosis with marked improvement in his symptoms and normalization of his Doppler studies.  He noticed sudden onset of numbness in his left foot approximately 2 months ago similar to his preintervention symptoms.  Based on this we decided to proceed with outpatient diagnostic peripheral angiography and endovascular therapy which was performed on 05/23/2020.  I performed laser atherectomy followed by drug-coated balloon angioplasty of high-grade left SFA "in-stent restenosis" with an excellent result.  He had 0% residual stenosis.  Dopplers normalized and his claudication resolved.  Interestingly, he was placed on Praluent as well and his total cholesterol came down to 75 and LDL 16. ?  ?Since I saw him in the office 1 year ago he continues  to do well.  He still denies left lower extremity claudication but complains of left hip pain.  He apparently he saw an orthopedic surgeon at [facility] and wants to do left total hip replacement but put this on hold because of his PAD.  His most recent lower extremity arterial Dopplers performed 02/08/2022 revealed a right ABI 1.13, left of 0.92 with in-stent restenosis within the left SFA stent.  He has never had aortoiliac disease.  I believe he is stable to undergo his left total hip replacement at low risk from a peripheral vascular standpoint although I am going to check aortoiliac Doppler studies. ? ? ?Current Meds  ?Medication Sig  ? acetaminophen (TYLENOL) 500 MG tablet Take 500-1,000 mg by mouth every 6 (six) hours as needed (for pain.).  ? aspirin EC 81 MG tablet Take 81 mg by mouth in the morning.  ? atorvastatin (LIPITOR) 80 MG tablet TAKE 1 TABLET BY MOUTH EVERY DAY  ? Multiple Vitamins-Minerals (MULTIVITAMIN WITH MINERALS) tablet Take 1 tablet by mouth in the morning.  ? sacubitril-valsartan (ENTRESTO) 24-26 MG Take 1 tablet by mouth 2 (two) times daily. (Patient taking differently: Take 1 tablet by mouth in the morning.)  ?  ? ?Allergies  ?Allergen Reactions  ? Influenza Vaccines Other (See Comments)  ?  Pt gets sick or sicker with flu shot   ? ? ?Social History  ? ?Socioeconomic History  ? Marital status: Married  ?  Spouse name: Not on file  ? Number of children: Not on file  ? Years of education: Not on  file  ? Highest education level: Not on file  ?Occupational History  ? Not on file  ?Tobacco Use  ? Smoking status: Light Smoker  ?  Types: Cigars  ? Smokeless tobacco: Never  ? Tobacco comments:  ?  09/04/2018 "q time I play golf"  ?Vaping Use  ? Vaping Use: Never used  ?Substance and Sexual Activity  ? Alcohol use: Yes  ?  Alcohol/week: 2.0 standard drinks  ?  Types: 2 Cans of beer per week  ? Drug use: Not Currently  ? Sexual activity: Yes  ?  Partners: Female  ?Other Topics Concern  ? Not on  file  ?Social History Narrative  ? Not on file  ? ?Social Determinants of Health  ? ?Financial Resource Strain: Not on file  ?Food Insecurity: Not on file  ?Transportation Needs: Not on file  ?Physical Activity: Not on file  ?Stress: Not on file  ?Social Connections: Not on file  ?Intimate Partner Violence: Not on file  ?  ? ?Review of Systems: ?General: negative for chills, fever, night sweats or weight changes.  ?Cardiovascular: negative for chest pain, dyspnea on exertion, edema, orthopnea, palpitations, paroxysmal nocturnal dyspnea or shortness of breath ?Dermatological: negative for rash ?Respiratory: negative for cough or wheezing ?Urologic: negative for hematuria ?Abdominal: negative for nausea, vomiting, diarrhea, bright red blood per rectum, melena, or hematemesis ?Neurologic: negative for visual changes, syncope, or dizziness ?All other systems reviewed and are otherwise negative except as noted above. ? ? ? ?Blood pressure (!) 110/58, pulse 72, height 5' 10.05" (1.779 m), weight 194 lb 6.4 oz (88.2 kg), SpO2 95 %.  ?General appearance: alert and no distress ?Neck: no adenopathy, no carotid bruit, no JVD, supple, symmetrical, trachea midline, and thyroid not enlarged, symmetric, no tenderness/mass/nodules ?Lungs: clear to auscultation bilaterally ?Heart: regular rate and rhythm, S1, S2 normal, no murmur, click, rub or gallop ?Extremities: extremities normal, atraumatic, no cyanosis or edema ?Pulses: Diminished left pedal pulse ?Skin: Skin color, texture, turgor normal. No rashes or lesions ?Neurologic: Grossly normal ? ?EKG not performed today ? ?ASSESSMENT AND PLAN:  ? ?PVD (peripheral vascular disease) (HCC) ?Steven Ford returns today for evaluation of his PAD.  He has had bilateral SFA stenting dating back to 2019 with reintervention of his left SFA in-stent restenosis with laser atherectomy followed by [facility] by myself 05/15/2020 with marked improvement in his Dopplers and his claudication symptoms.  He  apparently needs his left hip operated on and this was delayed because of his PAD.  His most recent Dopplers performed 02/07/2022 revealed a right ABI of 1.13 and a left of 0.92.  He does have significant "in-stent restenosis" within the mid stent on the left SFA.  He has never had iliac disease.  I am going to check aortoiliac Dopplers and if these are normal I will clear him for his orthopedic surgical procedure at low risk.  He currently denies left lower extremity claudication. ? ? ? ? ?Runell Gess MD FACP,FACC,FAHA, FSCAI ?03/13/2022 ?8:52 AM ?

## 2022-03-13 NOTE — Assessment & Plan Note (Signed)
Steven Ford returns today for evaluation of his PAD.  He has had bilateral SFA stenting dating back to 2019 with reintervention of his left SFA in-stent restenosis with laser atherectomy followed by [facility] by myself 05/15/2020 with marked improvement in his Dopplers and his claudication symptoms.  He apparently needs his left hip operated on and this was delayed because of his PAD.  His most recent Dopplers performed 02/07/2022 revealed a right ABI of 1.13 and a left of 0.92.  He does have significant "in-stent restenosis" within the mid stent on the left SFA.  He has never had iliac disease.  I am going to check aortoiliac Dopplers and if these are normal I will clear him for his orthopedic surgical procedure at low risk.  He currently denies left lower extremity claudication. ?

## 2022-03-16 ENCOUNTER — Ambulatory Visit (HOSPITAL_COMMUNITY)
Admission: RE | Admit: 2022-03-16 | Discharge: 2022-03-16 | Disposition: A | Discharge status: Home / Self Care | Payer: BC Managed Care – PPO | Source: Ambulatory Visit | Attending: Internal Medicine | Admitting: Internal Medicine

## 2022-03-16 ENCOUNTER — Ambulatory Visit (HOSPITAL_BASED_OUTPATIENT_CLINIC_OR_DEPARTMENT_OTHER)
Admission: RE | Admit: 2022-03-16 | Discharge: 2022-03-16 | Disposition: A | Discharge status: Home / Self Care | Payer: BC Managed Care – PPO | Source: Ambulatory Visit | Attending: Cardiovascular Disease | Admitting: Cardiovascular Disease

## 2022-03-16 ENCOUNTER — Institutional Professional Consult (permissible substitution): Payer: BC Managed Care – PPO | Admitting: Cardiovascular Disease

## 2022-03-16 DIAGNOSIS — I739 Peripheral vascular disease, unspecified: Secondary | ICD-10-CM

## 2022-03-16 DIAGNOSIS — Z95820 Peripheral vascular angioplasty status with implants and grafts: Secondary | ICD-10-CM | POA: Diagnosis not present

## 2022-03-26 NOTE — H&P (Signed)
HIP ARTHROPLASTY ADMISSION H&P ? ?Patient ID: ?Steven Ford ?MRN: [id number] ?DOB/AGE: 1961-08-02 61 y.o. ? ?Chief Complaint: left hip pain. ? ?Planned Procedure Date: 04/25/22 ?Medical Clearance by Vincent Gros NP-C   ?Cardiac Clearance by Dr. Nanetta Batty ? ? ?HPI: ?Steven Ford is a 61 y.o. male who presents for evaluation of OA LEFT HIP. The patient has a history of pain and functional disability in the left hip due to arthritis and has failed non-surgical conservative treatments for greater than 12 weeks to include NSAID's and/or analgesics, corticosteriod injections, and activity modification.  Onset of symptoms was gradual, starting 1 year ago with gradually worsening course since that time. The patient noted no past surgery on the left hip.  Patient currently rates pain at 8 out of 10 with activity. Patient has night pain, worsening of pain with activity and weight bearing, and pain that interferes with activities of daily living.  Patient has evidence of subchondral sclerosis, periarticular osteophytes, joint space narrowing, and superior migration of the femoral head in relation to the acetabulum  by imaging studies.  There is no active infection. ? ?Past Medical History:  ?Diagnosis Date  ? Cardiomyopathy (HCC) 02/12/2020  ? Echo 12/21/2019 showed decreased EF of 40%, grade 2DD, with global hypokinesia. Stress Myoview did not show evidence of ischemia.  ? Claudication in peripheral vascular disease (HCC) 07/15/2018  ? Peripheral arterial disease  ? Coronary artery disease   ? Coronary artery disease involving native coronary artery of native heart without angina pectoris 11/01/2015  ? Dyslipidemia 11/01/2015  ? Hyperlipidemia  ? Encounter to establish care 03/07/2021  ? High cholesterol   ? Hyperlipidemia   ? Hypertension   ? Migraine   ? "none in a long long while" (09/04/2018)  ? Preop cardiovascular exam 12/11/2019  ? Primary osteoarthritis of right knee 03/07/2021  ? PVD (peripheral  vascular disease) (HCC)   ? 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  ? 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 ?Past Surgical History:  ?Procedure Laterality Date  ? ABDOMINAL AORTOGRAM W/LOWER EXTREMITY  02/18/2020  ? ABDOMINAL AORTOGRAM W/LOWER EXTREMITY   ? ABDOMINAL AORTOGRAM W/LOWER EXTREMITY Right 02/18/2020  ? Procedure: ABDOMINAL AORTOGRAM W/LOWER EXTREMITY;  Surgeon: Runell Gess, MD;  Location: [facility] INVASIVE CV LAB;  Service: Cardiovascular;  Laterality: Right;  ? ABDOMINAL AORTOGRAM W/LOWER EXTREMITY Right 05/23/2020  ? Procedure: ABDOMINAL AORTOGRAM W/LOWER EXTREMITY;  Surgeon: Runell Gess, MD;  Location: [facility] INVASIVE CV LAB;  Service: Cardiovascular;  Laterality: Right;  ? ACHILLES TENDON REPAIR Right 2010  ? CORONARY ARTERY BYPASS GRAFT  2002  ? "triple"  ? KNEE ARTHROSCOPY Right 2019  ? LOWER EXTREMITY ANGIOGRAPHY N/A 08/28/2018  ? Procedure: LOWER EXTREMITY ANGIOGRAPHY;  Surgeon: Runell Gess, MD;  Location: [facility] INVASIVE CV LAB;  Service: Cardiovascular;  Laterality: N/A;  ? LOWER EXTREMITY ANGIOGRAPHY N/A 09/04/2018  ? Procedure: LOWER EXTREMITY ANGIOGRAPHY;  Surgeon: Runell Gess, MD;  Location: [facility] INVASIVE CV LAB;  Service: Cardiovascular;  Laterality: N/A;  ? PERIPHERAL VASCULAR ATHERECTOMY  02/18/2020  ? Procedure: PERIPHERAL VASCULAR ATHERECTOMY;  Surgeon: Runell Gess, MD;  Location: [facility] INVASIVE CV LAB;  Service: Cardiovascular;;  rt SFA  ? PERIPHERAL VASCULAR ATHERECTOMY  05/23/2020  ? Procedure: PERIPHERAL VASCULAR ATHERECTOMY;  Surgeon: Runell Gess, MD;  Location: [facility] INVASIVE CV LAB;  Service: Cardiovascular;;  Laser-Left SFA  ? PERIPHERAL VASCULAR BALLOON ANGIOPLASTY  05/23/2020  ? Procedure: PERIPHERAL VASCULAR BALLOON ANGIOPLASTY;  Surgeon: Runell Gess, MD;  Location: [facility] INVASIVE CV LAB;  Service: Cardiovascular;;  Left SFA  ? PERIPHERAL VASCULAR INTERVENTION Left 09/04/2018  ? PERIPHERAL VASCULAR INTERVENTION Left 09/04/2018  ?  Procedure: PERIPHERAL VASCULAR INTERVENTION;  Surgeon: Runell GessBerry, Jonathan J, MD;  [facility];  Service: Cardiovascular;  Laterality: Left;  ? PERIPHERAL VASCULAR INTERVENTION  02/18/2020  ? Procedure: PERIPHERAL VASCULAR INTERVENTION;  Surgeon: Runell GessBerry, Jonathan J, MD;  [facility];  Service: Cardiovascular;;  rt SFA  ? ?Allergies  ?Allergen Reactions  ? Influenza Vaccines Other (See Comments)  ?  Pt gets sick or sicker with flu shot   ? ?Prior to Admission medications   ?Medication Sig Start Date End Date Taking? Authorizing Provider  ?acetaminophen (TYLENOL) 500 MG tablet Take 500-1,000 mg by mouth every 6 (six) hours as needed (for pain.).    [provider]  ?aspirin EC 81 MG tablet Take 81 mg by mouth in the morning.    [provider]  ?atorvastatin (LIPITOR) 80 MG tablet TAKE 1 TABLET BY MOUTH EVERY DAY 03/07/22   Georgeanna LeaKrasowski, Robert J, MD  ?Multiple Vitamins-Minerals (MULTIVITAMIN WITH MINERALS) tablet Take 1 tablet by mouth in the morning.    [provider]  ?sacubitril-valsartan (ENTRESTO) 24-26 MG Take 1 tablet by mouth 2 (two) times daily. ?Patient taking differently: Take 1 tablet by mouth in the morning. 05/31/21   Georgeanna LeaKrasowski, Robert J, MD  ? ?Social History  ? ?Socioeconomic History  ? Marital status: Married  ?  Spouse name: Not on file  ? Number of children: Not on file  ? Years of education: Not on file  ? Highest education level: Not on file  ?Occupational History  ? Not on file  ?Tobacco Use  ? Smoking status: Light Smoker  ?  Types: Cigars  ? Smokeless tobacco: Never  ? Tobacco comments:  ?  09/04/2018 "q time I play golf"  ?Vaping Use  ? Vaping Use: Never used  ?Substance and Sexual Activity  ? Alcohol use: Yes  ?  Alcohol/week: 2.0 standard drinks  ?  Types: 2 Cans of beer per week  ? Drug use: Not Currently  ? Sexual activity: Yes  ?  Partners: Female  ?Other Topics Concern  ? Not on file  ?Social History Narrative  ? Not on file  ? ?Social  Determinants of Health  ? ?Financial Resource Strain: Not on file  ?Food Insecurity: Not on file  ?Transportation Needs: Not on file  ?Physical Activity: Not on file  ?Stress: Not on file  ?Social Connections: Not on file  ? ?Family History  ?Problem Relation Age of Onset  ? Heart disease Mother   ? High blood pressure Mother   ? Anemia Father   ? ? ?ROS: Currently denies lightheadedness, dizziness, Fever, chills, CP, SOB.   ?No personal history of DVT, PE, MI, or CVA. ?No loose teeth ?+ Partial dentures ?All other systems have been reviewed and were otherwise currently negative with the exception of those mentioned in the HPI and as above. ? ?Objective: ?Vitals: Ht: 5'10" Wt: 200.8 lbs Temp: 97.7 BP: 144/86 Pulse: 68 O2 96% on room air.   ?Physical Exam: ?General: Alert, NAD. Trendelenberg Gait  ?HEENT: EOMI, Good Neck Extension  ?Pulm: No increased work of breathing.  Clear B/L A/P w/o crackle or wheeze.  ?CV: RRR, No m/g/r appreciated ?GI: soft, NT, ND. BS x 4 quadrants ?Neuro: CN II-XII grossly intact without focal deficit.  Sensation intact distally ?Skin: No lesions in the  area of chief complaint ?MSK/Surgical Site: + TTP. Hip pain with ROM. + Stinchfield. - SLR. - FABER. + FADIR. 5/5 strength.  NVI.   ? ?Imaging Review ?Plain radiographs demonstrate moderate degenerative joint disease of the left hip.  ? ?The bone quality appears to be fair for age and reported activity level. ? ?Preoperative templating of the joint replacement has been completed, documented, and submitted to the Operating Room personnel in order to optimize intra-operative equipment management. ? ?Assessment: ?OA LEFT HIP ?Active Problems: ?  * No active hospital problems. * ? ? ?He had a recent aortoiliac doppler study on 03/16/22 to evaluate for re-stenosis of his left SFA stent placed in 2019 which shows:  ? ?Widely patent bilateral common and external iliac arteries without evidence of stenosis ? ? ? ?Plan: ?Plan for Procedure(s): ?TOTAL  HIP ARTHROPLASTY ? ?The patient history, physical exam, clinical judgement of the provider and imaging are consistent with end stage degenerative joint disease and total joint arthroplasty is deemed medically n

## 2022-04-17 ENCOUNTER — Encounter (HOSPITAL_COMMUNITY): Admission: RE | Admit: 2022-04-17 | Payer: BC Managed Care – PPO | Source: Ambulatory Visit

## 2022-04-17 NOTE — Patient Instructions (Addendum)
DUE TO COVID-19 ONLY TWO VISITORS  (aged 61 and older)  ARE ALLOWED TO COME WITH YOU AND STAY IN THE WAITING ROOM ONLY DURING PRE OP AND PROCEDURE.   **NO VISITORS ARE ALLOWED IN THE SHORT STAY AREA OR RECOVERY ROOM!!**  IF YOU WILL BE ADMITTED INTO THE HOSPITAL YOU ARE ALLOWED ONLY FOUR SUPPORT PEOPLE DURING VISITATION HOURS ONLY (7 AM -8PM)   The support person(s) must pass our screening, gel in and out, and wear a mask at all times, including in the patient's room. Patients must also wear a mask when staff or their support person are in the room. Visitors GUEST BADGE MUST BE WORN VISIBLY  One adult visitor may remain with you overnight and MUST be in the room by 8 P.M.     Your procedure is scheduled on: 04/25/22   Report to [facility] Main Entrance    Report to short stay at : 5:15 AM   Call this number if you have problems the morning of surgery [phone]   Do not eat food :After Midnight.   After Midnight you may have the following liquids until : 4:30 AM DAY OF SURGERY  Water Black Coffee (sugar ok, NO MILK/CREAM OR CREAMERS)  Tea (sugar ok, NO MILK/CREAM OR CREAMERS) regular and decaf                             Plain Jell-O (NO RED)                                           Fruit ices (not with fruit pulp, NO RED)                                     Popsicles (NO RED)                                                                  Juice: apple, WHITE grape, WHITE cranberry Sports drinks like Gatorade (NO RED) Clear broth(vegetable,chicken,beef)  Drink  Ensure drink AT 10:00 AM the morning of surgery.     The day of surgery:  Drink ONE (1) Pre-Surgery Clear Ensure or G2 at AM the morning of surgery. Drink in one sitting. Do not sip.  This drink was given to you during your hospital  pre-op appointment visit. Nothing else to drink after completing the  Pre-Surgery Clear Ensure or G2.          If you have questions, please contact your surgeon's office.    Oral Hygiene is also important to reduce your risk of infection.                                    Remember - BRUSH YOUR TEETH THE MORNING OF SURGERY WITH YOUR REGULAR TOOTHPASTE   Do NOT smoke after Midnight   Take these medicines the morning of surgery with A SIP OF WATER: Tylenol as needed.  DO NOT TAKE  ANY ORAL DIABETIC MEDICATIONS DAY OF YOUR SURGERY  Bring CPAP mask and tubing day of surgery.                              You may not have any metal on your body including hair pins, jewelry, and body piercing             Do not wear lotions, powders, perfumes/cologne, or deodorant               Men may shave face and neck.   Do not bring valuables to the hospital. [facility] IS NOT             RESPONSIBLE   FOR VALUABLES.   Contacts, dentures or bridgework may not be worn into surgery.   Bring small overnight bag day of surgery.    Patients discharged on the day of surgery will not be allowed to drive home.  Someone NEEDS to stay with you for the first 24 hours after anesthesia.   Special Instructions: Bring a copy of your healthcare power of attorney and living will documents         the day of surgery if you haven't scanned them before.              Please read over the following fact sheets you were given: IF YOU HAVE QUESTIONS ABOUT YOUR PRE-OP INSTRUCTIONS PLEASE CALL [phone]     [facility] - Preparing for Surgery Before surgery, you can play an important role.  Because skin is not sterile, your skin needs to be as free of germs as possible.  You can reduce the number of germs on your skin by washing with CHG (chlorahexidine gluconate) soap before surgery.  CHG is an antiseptic cleaner which kills germs and bonds with the skin to continue killing germs even after washing. Please DO NOT use if you have an allergy to CHG or antibacterial soaps.  If your skin becomes reddened/irritated stop using the CHG and inform your nurse when you arrive at Short Stay. Do not  shave (including legs and underarms) for at least 48 hours prior to the first CHG shower.  You may shave your face/neck. Please follow these instructions carefully:  1.  Shower with CHG Soap the night before surgery and the  morning of Surgery.  2.  If you choose to wash your hair, wash your hair first as usual with your  normal  shampoo.  3.  After you shampoo, rinse your hair and body thoroughly to remove the  shampoo.                           4.  Use CHG as you would any other liquid soap.  You can apply chg directly  to the skin and wash                       Gently with a scrungie or clean washcloth.  5.  Apply the CHG Soap to your body ONLY FROM THE NECK DOWN.   Do not use on face/ open                           Wound or open sores. Avoid contact with eyes, ears mouth and genitals (private parts).  Wash face,  Genitals (private parts) with your normal soap.             6.  Wash thoroughly, paying special attention to the area where your surgery  will be performed.  7.  Thoroughly rinse your body with warm water from the neck down.  8.  DO NOT shower/wash with your normal soap after using and rinsing off  the CHG Soap.                9.  Pat yourself dry with a clean towel.            10.  Wear clean pajamas.            11.  Place clean sheets on your bed the night of your first shower and do not  sleep with pets. Day of Surgery : Do not apply any lotions/deodorants the morning of surgery.  Please wear clean clothes to the hospital/surgery center.  FAILURE TO FOLLOW THESE INSTRUCTIONS MAY RESULT IN THE CANCELLATION OF YOUR SURGERY PATIENT SIGNATURE_________________________________  NURSE SIGNATURE__________________________________  ________________________________________________________________________   Rogelia MireIncentive Spirometer  An incentive spirometer is a tool that can help keep your lungs clear and active. This tool measures how well you are filling your lungs  with each breath. Taking long deep breaths may help reverse or decrease the chance of developing breathing (pulmonary) problems (especially infection) following: A long period of time when you are unable to move or be active. BEFORE THE PROCEDURE  If the spirometer includes an indicator to show your best effort, your nurse or respiratory therapist will set it to a desired goal. If possible, sit up straight or lean slightly forward. Try not to slouch. Hold the incentive spirometer in an upright position. INSTRUCTIONS FOR USE  Sit on the edge of your bed if possible, or sit up as far as you can in bed or on a chair. Hold the incentive spirometer in an upright position. Breathe out normally. Place the mouthpiece in your mouth and seal your lips tightly around it. Breathe in slowly and as deeply as possible, raising the piston or the ball toward the top of the column. Hold your breath for 3-5 seconds or for as long as possible. Allow the piston or ball to fall to the bottom of the column. Remove the mouthpiece from your mouth and breathe out normally. Rest for a few seconds and repeat Steps 1 through 7 at least 10 times every 1-2 hours when you are awake. Take your time and take a few normal breaths between deep breaths. The spirometer may include an indicator to show your best effort. Use the indicator as a goal to work toward during each repetition. After each set of 10 deep breaths, practice coughing to be sure your lungs are clear. If you have an incision (the cut made at the time of surgery), support your incision when coughing by placing a pillow or rolled up towels firmly against it. Once you are able to get out of bed, walk around indoors and cough well. You may stop using the incentive spirometer when instructed by your caregiver.  RISKS AND COMPLICATIONS Take your time so you do not get dizzy or light-headed. If you are in pain, you may need to take or ask for pain medication before doing  incentive spirometry. It is harder to take a deep breath if you are having pain. AFTER USE Rest and breathe slowly and easily. It can be helpful to keep track  of a log of your progress. Your caregiver can provide you with a simple table to help with this. If you are using the spirometer at home, follow these instructions: [place] IF:  You are having difficultly using the spirometer. You have trouble using the spirometer as often as instructed. Your pain medication is not giving enough relief while using the spirometer. You develop fever of 100.5 F (38.1 C) or higher. SEEK IMMEDIATE MEDICAL CARE IF:  You cough up bloody sputum that had not been present before. You develop fever of 102 F (38.9 C) or greater. You develop worsening pain at or near the incision site. MAKE SURE YOU:  Understand these instructions. Will watch your condition. Will get help right away if you are not doing well or get worse. Document Released: 03/25/2007 Document Revised: 02/04/2012 Document Reviewed: 05/26/2007 [facility] Patient Information 2014 [place], [place].   ________________________________________________________________________

## 2022-04-18 ENCOUNTER — Other Ambulatory Visit: Payer: Self-pay

## 2022-04-18 ENCOUNTER — Encounter (HOSPITAL_COMMUNITY)
Admission: RE | Admit: 2022-04-18 | Discharge: 2022-04-18 | Disposition: A | Discharge status: Home / Self Care | Payer: BC Managed Care – PPO | Source: Ambulatory Visit | Attending: Orthopedic Surgery | Admitting: Orthopedic Surgery

## 2022-04-18 ENCOUNTER — Encounter (HOSPITAL_COMMUNITY): Payer: Self-pay

## 2022-04-18 VITALS — BP 153/93 | HR 63 | Temp 97.8°F | Ht 70.5 in | Wt 197.0 lb

## 2022-04-18 DIAGNOSIS — F17200 Nicotine dependence, unspecified, uncomplicated: Secondary | ICD-10-CM | POA: Insufficient documentation

## 2022-04-18 DIAGNOSIS — I429 Cardiomyopathy, unspecified: Secondary | ICD-10-CM | POA: Diagnosis not present

## 2022-04-18 DIAGNOSIS — Z01812 Encounter for preprocedural laboratory examination: Secondary | ICD-10-CM | POA: Diagnosis present

## 2022-04-18 DIAGNOSIS — I739 Peripheral vascular disease, unspecified: Secondary | ICD-10-CM | POA: Insufficient documentation

## 2022-04-18 DIAGNOSIS — M1612 Unilateral primary osteoarthritis, left hip: Secondary | ICD-10-CM | POA: Diagnosis not present

## 2022-04-18 DIAGNOSIS — I10 Essential (primary) hypertension: Secondary | ICD-10-CM | POA: Diagnosis not present

## 2022-04-18 DIAGNOSIS — I2510 Atherosclerotic heart disease of native coronary artery without angina pectoris: Secondary | ICD-10-CM | POA: Diagnosis not present

## 2022-04-18 DIAGNOSIS — Z95820 Peripheral vascular angioplasty status with implants and grafts: Secondary | ICD-10-CM | POA: Insufficient documentation

## 2022-04-18 DIAGNOSIS — Z951 Presence of aortocoronary bypass graft: Secondary | ICD-10-CM | POA: Diagnosis not present

## 2022-04-18 DIAGNOSIS — Z01818 Encounter for other preprocedural examination: Secondary | ICD-10-CM

## 2022-04-18 HISTORY — DX: Other specified postprocedural states: Z98.890

## 2022-04-18 HISTORY — DX: Nausea with vomiting, unspecified: R11.2

## 2022-04-18 HISTORY — DX: Other complications of anesthesia, initial encounter: T88.59XA

## 2022-04-18 LAB — CBC
HCT: 44.7 % (ref 39.0–52.0)
Hemoglobin: 14.9 g/dL (ref 13.0–17.0)
MCH: 31.1 pg (ref 26.0–34.0)
MCHC: 33.3 g/dL (ref 30.0–36.0)
MCV: 93.3 fL (ref 80.0–100.0)
Platelets: 254 10*3/uL (ref 150–400)
RBC: 4.79 MIL/uL (ref 4.22–5.81)
RDW: 13.4 % (ref 11.5–15.5)
WBC: 5.4 10*3/uL (ref 4.0–10.5)
nRBC: 0 % (ref 0.0–0.2)

## 2022-04-18 LAB — BASIC METABOLIC PANEL
Anion gap: 4 — ABNORMAL LOW (ref 5–15)
BUN: 14 mg/dL (ref 8–23)
CO2: 26 mmol/L (ref 22–32)
Calcium: 8.8 mg/dL — ABNORMAL LOW (ref 8.9–10.3)
Chloride: 112 mmol/L — ABNORMAL HIGH (ref 98–111)
Creatinine, Ser: 0.8 mg/dL (ref 0.61–1.24)
GFR, Estimated: >60 mL/min (ref >60)
Glucose, Bld: 107 mg/dL — ABNORMAL HIGH (ref 70–99)
Potassium: 4 mmol/L (ref 3.5–5.1)
Sodium: 142 mmol/L (ref 135–145)

## 2022-04-18 LAB — SURGICAL PCR SCREEN
MRSA, PCR: NEGATIVE
Staphylococcus aureus: NEGATIVE

## 2022-04-18 NOTE — Progress Notes (Signed)
For Short Stay: [place] appointment date: Date of COVID positive in last 24 days:  Bowel Prep reminder:   For Anesthesia: PCP - Leretha Pol: NP Cardiologist - Dr. Jenne Campus  Chest x-ray -  EKG - 01/30/22 Stress Test -  ECHO - 05/26/21 Cardiac Cath -  Pacemaker/ICD device last checked: Pacemaker orders received: Device Rep notified:  Spinal Cord Stimulator:  Sleep Study -  CPAP -   Fasting Blood Sugar -  Checks Blood Sugar _____ times a day Date and result of last Hgb A1c-  Blood Thinner Instructions: Aspirin Instructions: NO instructions Last Dose:  Activity level: Can go up a flight of stairs and activities of daily living without stopping and without chest pain and/or shortness of breath   Able to exercise without chest pain and/or shortness of breath   Unable to go up a flight of stairs without chest pain and/or shortness of breath     Anesthesia review: Hx: HTN,CAD,CABG,Smoker  Patient denies shortness of breath, fever, cough and chest pain at PAT appointment   Patient verbalized understanding of instructions that were given to them at the PAT appointment. Patient was also instructed that they will need to review over the PAT instructions again at home before surgery.

## 2022-04-19 NOTE — Progress Notes (Signed)
Anesthesia Chart Review   Case: [id number] Date/Time: 04/25/22 0715   Procedure: TOTAL HIP ARTHROPLASTY (Left: Hip)   Anesthesia type: Spinal   Pre-op diagnosis: OA LEFT HIP   Location: [facility] ROOM 06 / [facility]   Surgeons: Joen Laura, MD       DISCUSSION:61 y.o. smoker with h/o PONV, HTN, CAD (CABG 2002), cardiomyopathy, PVD SFA intervention/stent placement 2019, left hip OA scheduled for above procedure 04/25/2022 with Dr. Weber Cooks.   Left hip replacement postponed due to PAD, significant in stent restenosis within mid stent to left SFA.  He denies left lower extremity claudication symptoms. He was seen by cardiology 03/13/2022. Per Dr. Allyson Sabal, "I believe he is stable to undergo his left total hip replacement at low risk from a peripheral vascular standpoint although I am going to check aortoiliac Doppler studies"  Doppler study 03/16/22, no change from prior study, repeat in 12 months per Dr. Allyson Sabal. Mild left lower extremity arterial disease.   Anticipate pt can proceed with planned procedure barring acute status change.   VS: BP (!) 153/93   Pulse 63   Temp 36.6 C (Oral)   Ht 5' 10.5" (1.791 m)   Wt 89.4 kg   SpO2 95%   BMI 27.87 kg/m   PROVIDERS: Carlean Jews, NP  Georgeanna Lea, MD is Cardiologist  Nanetta Batty, MD is Cardiologist  LABS: Labs reviewed: Acceptable for surgery. (all labs ordered are listed, but only abnormal results are displayed)  Labs Reviewed  BASIC METABOLIC PANEL - Abnormal; Notable for the following components:      Result Value   Chloride 112 (*)    Glucose, Bld 107 (*)    Calcium 8.8 (*)    Anion gap 4 (*)    All other components within normal limits  SURGICAL PCR SCREEN  CBC     IMAGES: LOWER EXTREMITY DOPPLER STUDY 03/16/22 Summary:  Right: Resting right ankle-brachial index is within normal range. No  evidence of significant right lower extremity arterial disease. The right  toe-brachial index is normal.    Left: Resting left ankle-brachial index indicates mild left lower  extremity arterial disease. The left toe-brachial index is abnormal  EKG: 01/30/2022 Rate 64 bpm  NSR T wave abnormality, consider inferolateral ischemia   CV: Myocardial Perfusion 02/08/2022   Findings are consistent with no ischemia and no prior myocardial infarction. The study is intermediate risk.   No ST deviation was noted.   Left ventricular function is abnormal. Global function is moderately reduced. Nuclear stress EF: 39 %. The left ventricular ejection fraction is moderately decreased (30-44%). End diastolic cavity size is normal.   Prior study available for comparison from 02/12/2020.  Echo 05/26/21 1. Left ventricular ejection fraction, by estimation, is 40 to 45%. The  left ventricle has mildly decreased function. The left ventricle  demonstrates global hypokinesis. There is mild concentric left ventricular  hypertrophy. Left ventricular diastolic  parameters are consistent with Grade II diastolic dysfunction  (pseudonormalization). The average left ventricular global longitudinal  strain is -12.5 %. The global longitudinal strain is abnormal.   2. Right ventricular systolic function is normal. The right ventricular  size is normal. There is normal pulmonary artery systolic pressure.   3. Left atrial size was mildly dilated.   4. Right atrial size was moderately dilated.   5. The mitral valve is normal in structure. Trivial mitral valve  regurgitation. No evidence of mitral stenosis.   6. The aortic valve is normal in  structure. Aortic valve regurgitation is  not visualized. No aortic stenosis is present.   7. Abdominal aorta is mildly dilated (2.3 cm).   8. The inferior vena cava is normal in size with greater than 50%  respiratory variability, suggesting right atrial pressure of 3 mmHg.  Past Medical History:  Diagnosis Date   Cardiomyopathy (HCC) 02/12/2020   Echo 12/21/2019 showed decreased EF of  40%, grade 2DD, with global hypokinesia. Stress Myoview did not show evidence of ischemia.   Claudication in peripheral vascular disease (HCC) 07/15/2018   Peripheral arterial disease   Complication of anesthesia    Coronary artery disease    Coronary artery disease involving native coronary artery of native heart without angina pectoris 11/01/2015   Dyslipidemia 11/01/2015   Hyperlipidemia   Encounter to establish care 03/07/2021   High cholesterol    Hyperlipidemia    Hypertension    Migraine    "none in a long long while" (09/04/2018)   PONV (postoperative nausea and vomiting)    Preop cardiovascular exam 12/11/2019   Primary osteoarthritis of right knee 03/07/2021   PVD (peripheral vascular disease) (HCC)    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    Past Surgical History:  Procedure Laterality Date   ABDOMINAL AORTOGRAM W/LOWER EXTREMITY  02/18/2020   ABDOMINAL AORTOGRAM W/LOWER EXTREMITY    ABDOMINAL AORTOGRAM W/LOWER EXTREMITY Right 02/18/2020   Procedure: ABDOMINAL AORTOGRAM W/LOWER EXTREMITY;  Surgeon: Runell Gess, MD;  Location: [facility] INVASIVE CV LAB;  Service: Cardiovascular;  Laterality: Right;   ABDOMINAL AORTOGRAM W/LOWER EXTREMITY Right 05/23/2020   Procedure: ABDOMINAL AORTOGRAM W/LOWER EXTREMITY;  Surgeon: Runell Gess, MD;  Location: [facility] INVASIVE CV LAB;  Service: Cardiovascular;  Laterality: Right;   ACHILLES TENDON REPAIR Right 2010   CORONARY ARTERY BYPASS GRAFT  2002   "triple"   KNEE ARTHROSCOPY Right 2019   LOWER EXTREMITY ANGIOGRAPHY N/A 08/28/2018   Procedure: LOWER EXTREMITY ANGIOGRAPHY;  Surgeon: Runell Gess, MD;  Location: [facility]VAS[facility] CV LAB;  Service: Cardiovascular;  Laterality: N/A;   LOWER EXTREMITY ANGIOGRAPHY N/A 09/04/2018   Procedure: LOWER EXTREMITY ANGIOGRAPHY;  Surgeon: Runell Gess, MD;  Location: [facility]VAS[facility] CV LAB;  Service: Cardiovascular;  Laterality: N/A;    PERIPHERAL VASCULAR ATHERECTOMY  02/18/2020   Procedure: PERIPHERAL VASCULAR ATHERECTOMY;  Surgeon: Runell Gess, MD;  Location: [facility] INVASIVE CV LAB;  Service: Cardiovascular;;  rt SFA   PERIPHERAL VASCULAR ATHERECTOMY  05/23/2020   Procedure: PERIPHERAL VASCULAR ATHERECTOMY;  Surgeon: Runell Gess, MD;  Location: [facility] INVASIVE CV LAB;  Service: Cardiovascular;;  Laser-Left SFA   PERIPHERAL VASCULAR BALLOON ANGIOPLASTY  05/23/2020   Procedure: PERIPHERAL VASCULAR BALLOON ANGIOPLASTY;  Surgeon: Runell Gess, MD;  Location: [facility];  Service: Cardiovascular;;  Left SFA   PERIPHERAL VASCULAR INTERVENTION Left 09/04/2018   PERIPHERAL VASCULAR INTERVENTION Left 09/04/2018   Procedure: PERIPHERAL VASCULAR INTERVENTION;  Surgeon: Runell Gess, MD;  Location: [facility];  Service: Cardiovascular;  Laterality: Left;   PERIPHERAL VASCULAR INTERVENTION  02/18/2020   Procedure: PERIPHERAL VASCULAR INTERVENTION;  Surgeon: Runell Gess, MD;  Location: [facility];  Service: Cardiovascular;;  rt SFA    MEDICATIONS:  acetaminophen (TYLENOL) 500 MG tablet   aspirin EC 81 MG tablet   atorvastatin (LIPITOR) 80 MG tablet   Multiple Vitamins-Minerals (MULTIVITAMIN WITH MINERALS) tablet   sacubitril-valsartan (ENTRESTO) 24-26 MG   No current facility-administered medications for this encounter.  Konrad Felix Ward, PA-C [facility]-Surgical Testing [phone]

## 2022-04-19 NOTE — Anesthesia Preprocedure Evaluation (Addendum)
Anesthesia Evaluation  Patient identified by MRN, date of birth, ID band Patient awake    Reviewed: Allergy & Precautions, NPO status , Patient's Chart, lab work & pertinent test results  History of Anesthesia Complications (+) PONV and history of anesthetic complications  Airway Mallampati: II  TM Distance: >3 FB Neck ROM: Full    Dental no notable dental hx.    Pulmonary Current Smoker and Patient abstained from smoking.,    Pulmonary exam normal breath sounds clear to auscultation       Cardiovascular hypertension, + CAD, + CABG and + Peripheral Vascular Disease  Normal cardiovascular exam Rhythm:Regular Rate:Normal     Neuro/Psych negative neurological ROS  negative psych ROS   GI/Hepatic negative GI ROS, Neg liver ROS,   Endo/Other  negative endocrine ROS  Renal/GU negative Renal ROS  negative genitourinary   Musculoskeletal  (+) Arthritis , Osteoarthritis,    Abdominal   Peds negative pediatric ROS (+)  Hematology negative hematology ROS (+)   Anesthesia Other Findings   Reproductive/Obstetrics negative OB ROS                            Anesthesia Physical Anesthesia Plan  ASA: 3  Anesthesia Plan: Spinal   Post-op Pain Management: Regional block*   Induction: Intravenous  PONV Risk Score and Plan: 2 and Propofol infusion, Treatment may vary due to age or medical condition, Ondansetron and Dexamethasone  Airway Management Planned: Simple Face Mask  Additional Equipment:   Intra-op Plan:   Post-operative Plan:   Informed Consent: I have reviewed the patients History and Physical, chart, labs and discussed the procedure including the risks, benefits and alternatives for the proposed anesthesia with the patient or authorized representative who has indicated his/her understanding and acceptance.     Dental advisory given  Plan Discussed with: CRNA and  Surgeon  Anesthesia Plan Comments: (See PAT note 04/18/2022)       Anesthesia Quick Evaluation

## 2022-04-25 ENCOUNTER — Other Ambulatory Visit: Payer: Self-pay

## 2022-04-25 ENCOUNTER — Ambulatory Visit (HOSPITAL_COMMUNITY): Payer: BC Managed Care – PPO | Admitting: Certified Registered"

## 2022-04-25 ENCOUNTER — Ambulatory Visit (HOSPITAL_COMMUNITY): Payer: BC Managed Care – PPO

## 2022-04-25 ENCOUNTER — Encounter (HOSPITAL_COMMUNITY)
Admission: RE | Disposition: A | Discharge status: Home / Self Care | Payer: Self-pay | Source: Ambulatory Visit | Attending: Orthopedic Surgery

## 2022-04-25 ENCOUNTER — Encounter (HOSPITAL_COMMUNITY): Payer: Self-pay | Admitting: Orthopedic Surgery

## 2022-04-25 ENCOUNTER — Ambulatory Visit (HOSPITAL_COMMUNITY): Payer: BC Managed Care – PPO | Admitting: Physician Assistant

## 2022-04-25 ENCOUNTER — Ambulatory Visit (HOSPITAL_COMMUNITY)
Admission: RE | Admit: 2022-04-25 | Discharge: 2022-04-25 | Disposition: A | Discharge status: Home / Self Care | Payer: BC Managed Care – PPO | Source: Ambulatory Visit | Attending: Orthopedic Surgery | Admitting: Orthopedic Surgery

## 2022-04-25 DIAGNOSIS — M1612 Unilateral primary osteoarthritis, left hip: Secondary | ICD-10-CM | POA: Insufficient documentation

## 2022-04-25 DIAGNOSIS — F17200 Nicotine dependence, unspecified, uncomplicated: Secondary | ICD-10-CM | POA: Diagnosis not present

## 2022-04-25 DIAGNOSIS — I2510 Atherosclerotic heart disease of native coronary artery without angina pectoris: Secondary | ICD-10-CM | POA: Diagnosis not present

## 2022-04-25 DIAGNOSIS — I10 Essential (primary) hypertension: Secondary | ICD-10-CM | POA: Insufficient documentation

## 2022-04-25 DIAGNOSIS — I739 Peripheral vascular disease, unspecified: Secondary | ICD-10-CM | POA: Diagnosis not present

## 2022-04-25 DIAGNOSIS — Z951 Presence of aortocoronary bypass graft: Secondary | ICD-10-CM | POA: Insufficient documentation

## 2022-04-25 HISTORY — PX: TOTAL HIP ARTHROPLASTY: SHX124

## 2022-04-25 SURGERY — ARTHROPLASTY, HIP, TOTAL,POSTERIOR APPROACH
Anesthesia: Spinal | Site: Hip | Laterality: Left

## 2022-04-25 MED ORDER — PROPOFOL 10 MG/ML IV BOLUS
INTRAVENOUS | Status: DC | PRN
  Administered 2022-04-25 (×2): 20 mg via INTRAVENOUS

## 2022-04-25 MED ORDER — OXYCODONE HCL 5 MG PO TABS: 5.0000 mg | ORAL_TABLET | Freq: Once | ORAL | Status: DC | PRN

## 2022-04-25 MED ORDER — CELECOXIB 100 MG PO CAPS: 100.0000 mg | ORAL_CAPSULE | Freq: Two times a day (BID) | ORAL | 0 refills | Status: AC

## 2022-04-25 MED ORDER — PROPOFOL 500 MG/50ML IV EMUL
INTRAVENOUS | Status: DC | PRN
  Administered 2022-04-25: 125 ug/kg/min via INTRAVENOUS

## 2022-04-25 MED ORDER — FENTANYL CITRATE (PF) 100 MCG/2ML IJ SOLN
INTRAMUSCULAR | Status: DC | PRN
  Administered 2022-04-25 (×2): 50 ug via INTRAVENOUS

## 2022-04-25 MED ORDER — PHENYLEPHRINE 80 MCG/ML (10ML) SYRINGE FOR IV PUSH (FOR BLOOD PRESSURE SUPPORT)
PREFILLED_SYRINGE | INTRAVENOUS | Status: AC
  Filled 2022-04-25: qty 10

## 2022-04-25 MED ORDER — PHENYLEPHRINE HCL-NACL 20-0.9 MG/250ML-% IV SOLN
INTRAVENOUS | Status: DC | PRN
  Administered 2022-04-25: 50 ug/min via INTRAVENOUS

## 2022-04-25 MED ORDER — OXYCODONE HCL 5 MG/5ML PO SOLN: 5.0000 mg | Freq: Once | ORAL | Status: DC | PRN

## 2022-04-25 MED ORDER — ONDANSETRON HCL 4 MG/2ML IJ SOLN
INTRAMUSCULAR | Status: DC | PRN
  Administered 2022-04-25: 4 mg via INTRAVENOUS

## 2022-04-25 MED ORDER — METHOCARBAMOL 500 MG IVPB - SIMPLE MED: 500.0000 mg | Freq: Four times a day (QID) | INTRAVENOUS | Status: DC | PRN

## 2022-04-25 MED ORDER — EPHEDRINE SULFATE-NACL 50-0.9 MG/10ML-% IV SOSY
PREFILLED_SYRINGE | INTRAVENOUS | Status: DC | PRN
  Administered 2022-04-25 (×2): 5 mg via INTRAVENOUS

## 2022-04-25 MED ORDER — PHENYLEPHRINE 80 MCG/ML (10ML) SYRINGE FOR IV PUSH (FOR BLOOD PRESSURE SUPPORT)
PREFILLED_SYRINGE | INTRAVENOUS | Status: DC | PRN
  Administered 2022-04-25 (×5): 160 ug via INTRAVENOUS

## 2022-04-25 MED ORDER — LACTATED RINGERS IV BOLUS
250.0000 mL | Freq: Once | INTRAVENOUS | Status: AC
  Administered 2022-04-25: 250 mL via INTRAVENOUS

## 2022-04-25 MED ORDER — SODIUM CHLORIDE 0.9 % IR SOLN
Status: DC | PRN
  Administered 2022-04-25 (×2): 1000 mL

## 2022-04-25 MED ORDER — CHLORHEXIDINE GLUCONATE 0.12 % MT SOLN: 15.0000 mL | Freq: Once | OROMUCOSAL | Status: DC

## 2022-04-25 MED ORDER — PHENYLEPHRINE HCL (PRESSORS) 10 MG/ML IV SOLN
INTRAVENOUS | Status: AC
  Filled 2022-04-25: qty 1

## 2022-04-25 MED ORDER — SODIUM CHLORIDE (PF) 0.9 % IJ SOLN
INTRAMUSCULAR | Status: AC
  Filled 2022-04-25: qty 10

## 2022-04-25 MED ORDER — CHLORHEXIDINE GLUCONATE 0.12 % MT SOLN
15.0000 mL | Freq: Once | OROMUCOSAL | Status: AC
  Administered 2022-04-25: 15 mL via OROMUCOSAL

## 2022-04-25 MED ORDER — CELECOXIB 200 MG PO CAPS
400.0000 mg | ORAL_CAPSULE | Freq: Once | ORAL | Status: AC
  Administered 2022-04-25: 400 mg via ORAL
  Filled 2022-04-25: qty 2

## 2022-04-25 MED ORDER — MIDAZOLAM HCL 5 MG/5ML IJ SOLN
INTRAMUSCULAR | Status: DC | PRN
  Administered 2022-04-25: 2 mg via INTRAVENOUS

## 2022-04-25 MED ORDER — HYDROMORPHONE HCL 1 MG/ML IJ SOLN: 0.2500 mg | INTRAMUSCULAR | Status: DC | PRN

## 2022-04-25 MED ORDER — ONDANSETRON HCL 4 MG PO TABS: 4.0000 mg | ORAL_TABLET | Freq: Three times a day (TID) | ORAL | 0 refills | Status: AC | PRN

## 2022-04-25 MED ORDER — 0.9 % SODIUM CHLORIDE (POUR BTL) OPTIME
TOPICAL | Status: DC | PRN
  Administered 2022-04-25: 1000 mL

## 2022-04-25 MED ORDER — METHOCARBAMOL 500 MG PO TABS: 500.0000 mg | ORAL_TABLET | Freq: Four times a day (QID) | ORAL | Status: DC | PRN

## 2022-04-25 MED ORDER — LACTATED RINGERS IV SOLN: INTRAVENOUS | Status: DC

## 2022-04-25 MED ORDER — SODIUM CHLORIDE (PF) 0.9 % IJ SOLN
INTRAMUSCULAR | Status: AC
  Filled 2022-04-25: qty 50

## 2022-04-25 MED ORDER — BUPIVACAINE LIPOSOME 1.3 % IJ SUSP: 10.0000 mL | Freq: Once | INTRAMUSCULAR | Status: DC

## 2022-04-25 MED ORDER — ONDANSETRON HCL 4 MG/2ML IJ SOLN
4.0000 mg | Freq: Once | INTRAMUSCULAR | Status: DC | PRN
Start: 2022-04-25 — End: 2022-04-25

## 2022-04-25 MED ORDER — ACETAMINOPHEN 500 MG PO TABS: 1000.0000 mg | ORAL_TABLET | Freq: Three times a day (TID) | ORAL | 0 refills | Status: AC | PRN

## 2022-04-25 MED ORDER — DEXAMETHASONE SODIUM PHOSPHATE 10 MG/ML IJ SOLN
8.0000 mg | Freq: Once | INTRAMUSCULAR | Status: AC
  Administered 2022-04-25: 10 mg via INTRAVENOUS

## 2022-04-25 MED ORDER — MIDAZOLAM HCL 2 MG/2ML IJ SOLN
INTRAMUSCULAR | Status: AC
  Filled 2022-04-25: qty 2

## 2022-04-25 MED ORDER — METHOCARBAMOL 500 MG PO TABS: 500.0000 mg | ORAL_TABLET | Freq: Three times a day (TID) | ORAL | 0 refills | Status: AC | PRN

## 2022-04-25 MED ORDER — BUPIVACAINE LIPOSOME 1.3 % IJ SUSP
INTRAMUSCULAR | Status: AC
  Filled 2022-04-25: qty 20

## 2022-04-25 MED ORDER — ASPIRIN 81 MG PO TBEC: 81.0000 mg | DELAYED_RELEASE_TABLET | Freq: Two times a day (BID) | ORAL | 0 refills | Status: AC

## 2022-04-25 MED ORDER — SODIUM CHLORIDE (PF) 0.9 % IJ SOLN
INTRAMUSCULAR | Status: DC | PRN
  Administered 2022-04-25: 60 mL

## 2022-04-25 MED ORDER — PROPOFOL 500 MG/50ML IV EMUL
INTRAVENOUS | Status: AC
  Filled 2022-04-25: qty 50

## 2022-04-25 MED ORDER — ONDANSETRON HCL 4 MG/2ML IJ SOLN
INTRAMUSCULAR | Status: AC
  Filled 2022-04-25: qty 2

## 2022-04-25 MED ORDER — CEFAZOLIN SODIUM-DEXTROSE 2-4 GM/100ML-% IV SOLN
2.0000 g | INTRAVENOUS | Status: AC
  Administered 2022-04-25: 2 g via INTRAVENOUS
  Filled 2022-04-25: qty 100

## 2022-04-25 MED ORDER — LACTATED RINGERS IV BOLUS
500.0000 mL | Freq: Once | INTRAVENOUS | Status: AC
  Administered 2022-04-25: 500 mL via INTRAVENOUS

## 2022-04-25 MED ORDER — ORAL CARE MOUTH RINSE: 15.0000 mL | Freq: Once | OROMUCOSAL | Status: AC

## 2022-04-25 MED ORDER — BUPIVACAINE LIPOSOME 1.3 % IJ SUSP
INTRAMUSCULAR | Status: DC | PRN
  Administered 2022-04-25: 20 mL

## 2022-04-25 MED ORDER — ACETAMINOPHEN 500 MG PO TABS
1000.0000 mg | ORAL_TABLET | Freq: Once | ORAL | Status: AC
  Administered 2022-04-25: 1000 mg via ORAL
  Filled 2022-04-25: qty 2

## 2022-04-25 MED ORDER — FENTANYL CITRATE (PF) 100 MCG/2ML IJ SOLN
INTRAMUSCULAR | Status: AC
  Filled 2022-04-25: qty 2

## 2022-04-25 MED ORDER — CEFAZOLIN SODIUM-DEXTROSE 2-4 GM/100ML-% IV SOLN: 2.0000 g | Freq: Four times a day (QID) | INTRAVENOUS | Status: DC

## 2022-04-25 MED ORDER — OXYCODONE HCL 5 MG PO TABS: 5.0000 mg | ORAL_TABLET | ORAL | 0 refills | Status: AC | PRN

## 2022-04-25 MED ORDER — WATER FOR IRRIGATION, STERILE IR SOLN
Status: DC | PRN
  Administered 2022-04-25: 2000 mL

## 2022-04-25 MED ORDER — DEXAMETHASONE SODIUM PHOSPHATE 10 MG/ML IJ SOLN
INTRAMUSCULAR | Status: AC
  Filled 2022-04-25: qty 1

## 2022-04-25 MED ORDER — BUPIVACAINE IN DEXTROSE 0.75-8.25 % IT SOLN
INTRATHECAL | Status: DC | PRN
  Administered 2022-04-25: 2 mL via INTRATHECAL

## 2022-04-25 MED ORDER — POVIDONE-IODINE 10 % EX SWAB
2.0000 "application " | Freq: Once | CUTANEOUS | Status: AC
  Administered 2022-04-25: 2 via TOPICAL

## 2022-04-25 MED ORDER — POVIDONE-IODINE 10 % EX SWAB: 2.0000 "application " | Freq: Once | CUTANEOUS | Status: DC

## 2022-04-25 MED ORDER — TRANEXAMIC ACID-NACL 1000-0.7 MG/100ML-% IV SOLN
1000.0000 mg | INTRAVENOUS | Status: AC
  Administered 2022-04-25: 1000 mg via INTRAVENOUS
  Filled 2022-04-25: qty 100

## 2022-04-25 MED ORDER — PROPOFOL 500 MG/50ML IV EMUL
INTRAVENOUS | Status: AC
Start: 2022-04-25 — End: ?
  Filled 2022-04-25: qty 50

## 2022-04-25 MED ORDER — ORAL CARE MOUTH RINSE: 15.0000 mL | Freq: Once | OROMUCOSAL | Status: DC

## 2022-04-25 SURGICAL SUPPLY — 64 items
BAG COUNTER SPONGE SURGICOUNT (BAG) ×1 IMPLANT
BAG DECANTER FOR FLEXI CONT (MISCELLANEOUS) ×2 IMPLANT
BAG ZIPLOCK 12X15 (MISCELLANEOUS) ×2 IMPLANT
BLADE SAW SAG 25X90X1.19 (BLADE) ×1 IMPLANT
CHLORAPREP W/TINT 26 (MISCELLANEOUS) ×4 IMPLANT
COVER SURGICAL LIGHT HANDLE (MISCELLANEOUS) ×2 IMPLANT
DERMABOND ADVANCED (GAUZE/BANDAGES/DRESSINGS) ×1
DERMABOND ADVANCED .7 DNX12 (GAUZE/BANDAGES/DRESSINGS) ×1 IMPLANT
DRAPE 3/4 80X56 (DRAPES) ×4 IMPLANT
DRAPE HIP W/POCKET STRL (MISCELLANEOUS) ×2 IMPLANT
DRAPE INCISE IOBAN 66X45 STRL (DRAPES) ×2 IMPLANT
DRAPE INCISE IOBAN 85X60 (DRAPES) ×2 IMPLANT
DRAPE POUCH INSTRU U-SHP 10X18 (DRAPES) ×2 IMPLANT
DRAPE SURG 17X11 SM STRL (DRAPES) ×2 IMPLANT
DRAPE U-SHAPE 47X51 STRL (DRAPES) ×2 IMPLANT
DRESSING AQUACEL AG SP 3.5X10 (GAUZE/BANDAGES/DRESSINGS) ×1 IMPLANT
DRSG AQUACEL AG SP 3.5X10 (GAUZE/BANDAGES/DRESSINGS) ×2
ELECT BLADE TIP CTD 4 INCH (ELECTRODE) ×2 IMPLANT
ELECT REM PT RETURN 15FT ADLT (MISCELLANEOUS) ×2 IMPLANT
GLOVE BIOGEL PI IND STRL 8 (GLOVE) ×2 IMPLANT
GLOVE BIOGEL PI INDICATOR 8 (GLOVE) ×2
GLOVE SURG LX 8.0 MICRO (GLOVE) ×2
GLOVE SURG LX STRL 8.0 MICRO (GLOVE) ×2 IMPLANT
GLOVE SURG ORTHO 8.0 STRL STRW (GLOVE) ×2 IMPLANT
GOWN STRL REUS W/ TWL XL LVL3 (GOWN DISPOSABLE) ×1 IMPLANT
GOWN STRL REUS W/TWL XL LVL3 (GOWN DISPOSABLE) ×2
HANDPIECE INTERPULSE COAX TIP (DISPOSABLE) ×1
HEAD CERAMIC FEMORAL 36MM (Head) ×1 IMPLANT
HOLDER FOLEY CATH W/STRAP (MISCELLANEOUS) ×1 IMPLANT
HOOD PEEL AWAY FLYTE STAYCOOL (MISCELLANEOUS) ×6 IMPLANT
INSERT 0 DEG POLY  36 F (Miscellaneous) ×1 IMPLANT
INSERT 0 DEG POLY 36 F (Miscellaneous) IMPLANT
KIT BASIN OR (CUSTOM PROCEDURE TRAY) ×2 IMPLANT
KIT TURNOVER KIT A (KITS) ×1 IMPLANT
MANIFOLD NEPTUNE II (INSTRUMENTS) ×2 IMPLANT
MARKER SKIN DUAL TIP RULER LAB (MISCELLANEOUS) ×2 IMPLANT
NEEDLE HYPO 22GX1.5 SAFETY (NEEDLE) IMPLANT
NS IRRIG 1000ML POUR BTL (IV SOLUTION) ×2 IMPLANT
PACK TOTAL JOINT (CUSTOM PROCEDURE TRAY) ×2 IMPLANT
PROTECTOR NERVE ULNAR (MISCELLANEOUS) ×1 IMPLANT
RETRIEVER SUT HEWSON (MISCELLANEOUS) ×2 IMPLANT
SCREW HEX LP 6.5X25 (Screw) ×1 IMPLANT
SCREW HEX LP 6.5X35 (Screw) ×1 IMPLANT
SEALER BIPOLAR AQUA 6.0 (INSTRUMENTS) ×2 IMPLANT
SET HNDPC FAN SPRY TIP SCT (DISPOSABLE) IMPLANT
SHELL TRIDENT II CLUST SZ 56MM (Shell) ×1 IMPLANT
SPIKE FLUID TRANSFER (MISCELLANEOUS) ×6 IMPLANT
STEM ACCOLADE SZ 6 (Hips) ×1 IMPLANT
SUCTION FRAZIER HANDLE 12FR (TUBING) ×2
SUCTION TUBE FRAZIER 12FR DISP (TUBING) ×1 IMPLANT
SUT BONE WAX W31G (SUTURE) ×2 IMPLANT
SUT ETHIBOND #5 BRAIDED 30INL (SUTURE) ×2 IMPLANT
SUT MNCRL AB 3-0 PS2 18 (SUTURE) ×2 IMPLANT
SUT STRATAFIX 0 PDS 27 VIOLET (SUTURE) ×2
SUT STRATAFIX PDO 1 14 VIOLET (SUTURE) ×1
SUT STRATFX PDO 1 14 VIOLET (SUTURE) ×1
SUT VIC AB 2-0 CT2 27 (SUTURE) ×4 IMPLANT
SUTURE STRATFX 0 PDS 27 VIOLET (SUTURE) ×1 IMPLANT
SUTURE STRATFX PDO 1 14 VIOLET (SUTURE) ×1 IMPLANT
SYR 20ML LL LF (SYRINGE) ×4 IMPLANT
TOWEL OR 17X26 10 PK STRL BLUE (TOWEL DISPOSABLE) ×2 IMPLANT
TRAY FOLEY MTR SLVR 16FR STAT (SET/KITS/TRAYS/PACK) ×2 IMPLANT
UNDERPAD 30X36 HEAVY ABSORB (UNDERPADS AND DIAPERS) ×2 IMPLANT
WATER STERILE IRR 1000ML POUR (IV SOLUTION) ×4 IMPLANT

## 2022-04-25 NOTE — Op Note (Signed)
04/25/2022  10:39 AM  PATIENT:  Steven Ford   MRN: [id number]  PRE-OPERATIVE DIAGNOSIS: End-stage left hip osteoarthritis  POST-OPERATIVE DIAGNOSIS:  same  PROCEDURE:  Procedure(s): TOTAL HIP ARTHROPLASTY  PREOPERATIVE INDICATIONS:    Steven Ford is an 61 y.o. male who has a diagnosis of end-stage left hip osteoarthritis and elected for surgical management after failing conservative treatment.  The risks benefits and alternatives were discussed with the patient including but not limited to the risks of nonoperative treatment, versus surgical intervention including infection, bleeding, nerve injury, periprosthetic fracture, the need for revision surgery, dislocation, leg length discrepancy, blood clots, cardiopulmonary complications, morbidity, mortality, among others, and they were willing to proceed.     OPERATIVE REPORT     SURGEON:  Charlies Constable, MD    ASSISTANT:  Izola Price, RNFA, (Present throughout the entire procedure,  necessary for completion of procedure in a timely manner, assisting with retraction, instrumentation, and closure)     ANESTHESIA: Spinal  ESTIMATED BLOOD LOSS: 353 cc    COMPLICATIONS:  None.     COMPONENTS:   [employer] Trident 2 56 mm acetabular shell, 127 degree Accolade 2 size 6 femoral stem, 36+0 ceramic femoral head, 6.5 hex screws x2  PROCEDURE IN DETAIL:   The patient was met in the holding area and  identified.  The appropriate hip was identified and marked at the operative site.  The patient was then transported to the OR  and  placed under anesthesia.  At that point, the patient was  placed in the lateral decubitus position with the operative side up and  secured to the operating room table  and all bony prominences padded. A subaxillary role was also placed.    The operative lower extremity was prepped from the iliac crest to the distal leg.  Sterile draping was performed.  Time out was performed prior to incision.      A  routine posterolateral approach was utilized via sharp dissection  carried down to the subcutaneous tissue.  Gross bleeders were Bovie coagulated.  The iliotibial band was identified and incised along the length of the skin incision through the glute max fascia.  Charnley retractor was placed with care to protect the sciatic nerve posteriorly.  With the hip internally rotated, the piriformis tendon was identified and released from the femoral insertion and tagged with a #5 Ethibond.  A capsulotomy was then performed off the femoral insertion and also tagged with a #5 Ethibond.    The femoral neck was exposed, and I resected the femoral neck based on preoperative templating relative to the lesser trochanter.    I then exposed the deep acetabulum, cleared out any tissue including the ligamentum teres.  After adequate visualization, I excised the labrum.  I then started reaming with a 52 mm reamer, first medializing to the floor of the cotyloid fossa, and then in the position of the cup aiming towards the greater sciatic notch, matching the version of the transverse acetabular ligament and tucked under the anterior wall. I reamed up to 56 mm reamer with good bony bed preparation and a 56 mm cup was chosen.  The real cup was then impacted into place.  Appropriate version and inclination was confirmed clinically matching their bony anatomy, and also with the use of the jig.  I placed 2 screws in the posterior superior quadrant to augment fixation.  A neutral liner was placed and impacted. It was confirmed to be appropriately seated and the acetabular retractors  were removed.    I then prepared the proximal femur using the box cutter, Charnley awl, and then sequentially broached starting with 0 up to a size 6.   A trial broach, neck, and head was utilized, and I reduced the hip and it was found to have excellent stability.  There was no impingement with full extension and 90 degrees external rotation.  The hip  was stable at the position of sleep and with 90 degrees flexion and 70 degrees of internal rotation.  Leg lengths were also clinically assessed in the lateral position and felt to be equal.  Intra-Op from flatplate with the broach and trial head was obtained which confirmed appropriate restoration of length, offset and good fill of the femoral canal  A final femoral prosthesis size 6 was selected. I then impacted the real femoral prosthesis into place.I again trialed and selected a + 0 ball. and I impacted the real head ball into place. The hip was then reduced and taken through a range of motion. There was no impingement with full extension and 90 degrees external rotation.  The hip was stable at the position of sleep and with 90 degrees flexion and 70 degrees of internal rotation. Leg lengths were  again assessed and felt to be restored.  The posterior capsule was then closed with #5 Ethibond.  The piriformis was repaired through the base of the abductor tendon using a [place] suture passer.  I then irrigated the hip copiously again with pulse lavage. Periarticular injection was then performed with Exparel.  Intraop flat plate xray was obtained and components were confirmed to be in good position without fracture. We repaired the fascia #1 barbed suture, followed by 0 barbed suture for the subcutaneous fat.  Skin was closed with 2-0 Vicryl and 3-0 Monocryl.  Dermabond and Aquacel dressing were applied. The patient was then awakened and returned to PACU in stable and satisfactory condition.  Leg lengths in the supine position were assessed and felt to be clinically equal. There were no complications.  Post op recs: WB: WBAT LLE, posterior hip precautions x6 weeks Abx: ancef   Imaging: PACU pelvis Xray Dressing: Aquacell, keep intact until follow up DVT prophylaxis: Aspirin 81BID starting POD1 Follow up: 2 weeks after surgery for a wound check with Dr. Zachery Dakins at [facility].   Address: [address] [place] [address], [place],  [address]  Office Phone: [phone]   Charlies Constable, MD Orthopedic Surgeon

## 2022-04-25 NOTE — Anesthesia Postprocedure Evaluation (Signed)
Anesthesia Post Note  Patient: Steven Ford  Procedure(s) Performed: TOTAL HIP ARTHROPLASTY (Left: Hip)     Patient location during evaluation: PACU Anesthesia Type: Spinal Level of consciousness: oriented and awake and alert Pain management: pain level controlled Vital Signs Assessment: post-procedure vital signs reviewed and stable Respiratory status: spontaneous breathing, respiratory function stable and patient connected to nasal cannula oxygen Cardiovascular status: blood pressure returned to baseline and stable Postop Assessment: no headache, no backache and no apparent nausea or vomiting Anesthetic complications: no   No notable events documented.  Last Vitals:  Vitals:   04/25/22 1130 04/25/22 1145  BP: (!) 144/88   Pulse: (!) 59 70  Resp: 15 12  Temp:  (!) 36.4 C  SpO2: 99% 100%    Last Pain:  Vitals:   04/25/22 1145  TempSrc:   PainSc: 0-No pain                 Novella Abraha S

## 2022-04-25 NOTE — Discharge Instructions (Signed)

## 2022-04-25 NOTE — Evaluation (Signed)
Physical Therapy Evaluation Patient Details Name: Steven Ford MRN: [id number] DOB: [birth year] Today's Date: 04/25/2022  History of Present Illness  Pt is a 61yo male presenting s/p L-THA, posterior approach on 04/25/22. PMH: PAD, cardiomyopathy, CAD, HLD, HTN, CABG 2002 and 2016, R-TKA 2019.  Clinical Impression  Steven Ford is a 61 y.o. male POD 0 s/p L-THA, posterior approach. Patient reports independence with mobility at baseline. Patient is now limited by functional impairments (see PT problem list below) and requires min guard for transfers and gait with RW. Patient was able to ambulate 40 feet with RW and min guard and cues for safe walker management. Patient instructed in exercises to facilitate ROM and circulation. Patient will benefit from continued skilled PT interventions to address impairments and progress towards PLOF. Patient has met mobility goals at adequate level for discharge home; will continue to follow if pt continues acute stay to progress towards Mod I goals.       Recommendations for follow up therapy are one component of a multi-disciplinary discharge planning process, led by the attending physician.  Recommendations may be updated based on patient status, additional functional criteria and insurance authorization.  Follow Up Recommendations Follow physician's recommendations for discharge plan and follow up therapies    Assistance Recommended at Discharge Set up Supervision/Assistance  Patient can return home with the following  A little help with walking and/or transfers;A little help with bathing/dressing/bathroom;Assistance with cooking/housework;Assist for transportation;Help with stairs or ramp for entrance    Equipment Recommendations Rolling walker (2 wheels)  Recommendations for Other Services       Functional Status Assessment Patient has had a recent decline in their functional status and demonstrates the ability to make significant  improvements in function in a reasonable and predictable amount of time.     Precautions / Restrictions Precautions Precautions: None;Posterior Hip Precaution Booklet Issued: Yes (comment) Precaution Comments: REviewed via teachback Restrictions Weight Bearing Restrictions: No      Mobility  Bed Mobility Overal bed mobility: Modified Independent                  Transfers Overall transfer level: Needs assistance Equipment used: Rolling walker (2 wheels) Transfers: Sit to/from Stand Sit to Stand: Min guard           General transfer comment: for safety only, no physical assist required.    Ambulation/Gait Ambulation/Gait assistance: Min guard Gait Distance (Feet): 40 Feet Assistive device: Rolling walker (2 wheels) Gait Pattern/deviations: Step-to pattern       General Gait Details: Pt ambulated with RW and min guard assist, no physical assist required or overt LOB. VCs for rolling walker not picking up, proximity to device, and taking shorter steps.  Stairs            Wheelchair Mobility    Modified Rankin (Stroke Patients Only)       Balance Overall balance assessment: Needs assistance Sitting-balance support: Feet supported, No upper extremity supported Sitting balance-Leahy Scale: Good     Standing balance support: Reliant on assistive device for balance, During functional activity, Bilateral upper extremity supported Standing balance-Leahy Scale: Poor                               Pertinent Vitals/Pain Pain Assessment Pain Assessment: No/denies pain    Home Living Family/patient expects to be discharged to:: Private residence Living Arrangements: Spouse/significant other Available Help at Discharge: Family;Available 24 hours/day Type  of Home: House Home Access: Level entry     Alternate Level Stairs-Number of Steps: 20 Home Layout: Two level;Able to live on main level with bedroom/bathroom;Full bath on main  level Home Equipment: Cane - single point      Prior Function Prior Level of Function : Independent/Modified Independent             Mobility Comments: ind ADLs Comments: ind     Hand Dominance        Extremity/Trunk Assessment   Upper Extremity Assessment Upper Extremity Assessment: Generalized weakness    Lower Extremity Assessment Lower Extremity Assessment: RLE deficits/detail;LLE deficits/detail RLE Deficits / Details: MMT ank DF/PF 5/5 RLE Sensation: WNL LLE Deficits / Details: MMT ank DF/PF 5/5 LLE Sensation: WNL    Cervical / Trunk Assessment Cervical / Trunk Assessment: Normal  Communication   Communication: No difficulties  Cognition Arousal/Alertness: Awake/alert Behavior During Therapy: WFL for tasks assessed/performed Overall Cognitive Status: Within Functional Limits for tasks assessed                                          General Comments      Exercises Total Joint Exercises Ankle Circles/Pumps: AROM, Both, 10 reps Quad Sets: AROM, Both, Other reps (comment) (2) Short Arc Quad: AROM, Left, Other reps (comment) (2) Heel Slides: AROM, Left, Other reps (comment) (3) Hip ABduction/ADduction: AROM, Left, Other reps (comment) (3) Long Arc Quad: Other (comment) (reviewed, not performed) Marching in Standing: Other (comment) (reviewed, not performed)   Assessment/Plan    PT Assessment Patient needs continued PT services  PT Problem List Decreased strength;Decreased range of motion;Decreased activity tolerance;Decreased balance;Decreased mobility;Decreased coordination;Decreased knowledge of use of DME;Pain       PT Treatment Interventions DME instruction;Gait training;Stair training;Functional mobility training;Therapeutic activities;Therapeutic exercise;Balance training;Neuromuscular re-education;Patient/family education    PT Goals (Current goals can be found in the Care Plan section)  Acute Rehab PT Goals Patient Stated  Goal: Work out in the gym PT Goal Formulation: With patient Time For Goal Achievement: 05/02/22 Potential to Achieve Goals: Good    Frequency 7X/week     Co-evaluation               AM-PAC PT "6 Clicks" Mobility  Outcome Measure Help needed turning from your back to your side while in a flat bed without using bedrails?: None Help needed moving from lying on your back to sitting on the side of a flat bed without using bedrails?: None Help needed moving to and from a bed to a chair (including a wheelchair)?: A Little Help needed standing up from a chair using your arms (e.g., wheelchair or bedside chair)?: A Little Help needed to walk in hospital room?: A Little Help needed climbing 3-5 steps with a railing? : A Little 6 Click Score: 20    End of Session Equipment Utilized During Treatment: Gait belt Activity Tolerance: Patient tolerated treatment well;No increased pain Patient left: in chair;with call bell/phone within reach Nurse Communication: Mobility status PT Visit Diagnosis: Difficulty in walking, not elsewhere classified (R26.2)    Time: 7209-4709 PT Time Calculation (min) (ACUTE ONLY): 23 min   Charges:   PT Evaluation $PT Eval Low Complexity: 1 Low PT Treatments $Therapeutic Exercise: 8-22 mins        Coolidge Breeze, PT, DPT [facility] Department Office: [phone] Pager: [phone]  Coolidge Breeze 04/25/2022, 3:49 PM

## 2022-04-25 NOTE — Interval H&P Note (Signed)
The patient has been re-examined, and the chart reviewed, and there have been no interval changes to the documented history and physical.    Plan for left THA today for left hip OA.  The operative side was examined and the patient was confirmed to have. Sens DPN, SPN, TN intact, Motor EHL, ext, flex 5/5, and dopplerable DP and PT pulses, No significant edema.  Patient with history of significant peripheral vascular disease.  Had been canceled back in March but has since been seen by Dr. Allyson Sabal, his cardiologist, who had cleared him from a vascular standpoint.  The risks, benefits, and alternatives have been discussed at length with patient, and the patient is willing to proceed.  Left hip marked. Consent has been signed.

## 2022-04-25 NOTE — Anesthesia Procedure Notes (Signed)
Spinal  Patient location during procedure: OR Reason for block: surgical anesthesia Staffing Performed: anesthesiologist  Anesthesiologist: Myrtie Soman, MD Preanesthetic Checklist Completed: patient identified, IV checked, site marked, risks and benefits discussed, surgical consent, monitors and equipment checked, pre-op evaluation and timeout performed Spinal Block Patient position: sitting Prep: Betadine Patient monitoring: heart rate, continuous pulse ox and blood pressure Approach: midline Location: L3-4 Injection technique: single-shot Needle Needle type: Sprotte  Needle gauge: 24 G Needle length: 9 cm Assessment Sensory level: T6 Events: CSF return Additional Notes

## 2022-04-25 NOTE — Transfer of Care (Signed)
Immediate Anesthesia Transfer of Care Note  Patient: Steven Ford  Procedure(s) Performed: TOTAL HIP ARTHROPLASTY (Left: Hip)  Patient Location: PACU  Anesthesia Type:Spinal  Level of Consciousness: awake, alert  and oriented  Airway & Oxygen Therapy: Patient Spontanous Breathing and Patient connected to face mask oxygen  Post-op Assessment: Report given to RN and Post -op Vital signs reviewed and stable  Post vital signs: Reviewed and stable  Last Vitals:  Vitals Value Taken Time  BP    Temp    Pulse 71 04/25/22 1110  Resp 13 04/25/22 1110  SpO2 100 % 04/25/22 1110  Vitals shown include unvalidated device data.  Last Pain:  Vitals:   04/25/22 0601  TempSrc:   PainSc: 6       Patients Stated Pain Goal: 3 ([id number])  Complications: No notable events documented.

## 2022-04-25 NOTE — OR Nursing (Signed)
At 0800 I went to short stay and made patient aware that we had to get a new tray sent over due to the tray we had not being sterile, and that we were delayed due to that. I apologized to the patient for the delay, and explained that I was for his safety. Patient request the lights turned down to rest while waiting.

## 2022-04-26 ENCOUNTER — Encounter (HOSPITAL_COMMUNITY): Payer: Self-pay | Admitting: Orthopedic Surgery

## 2022-06-25 DIAGNOSIS — H6501 Acute serous otitis media, right ear: Secondary | ICD-10-CM | POA: Insufficient documentation

## 2022-06-25 DIAGNOSIS — H9011 Conductive hearing loss, unilateral, right ear, with unrestricted hearing on the contralateral side: Secondary | ICD-10-CM | POA: Insufficient documentation

## 2022-07-06 ENCOUNTER — Encounter: Payer: Self-pay | Admitting: Cardiology

## 2022-07-06 ENCOUNTER — Ambulatory Visit (INDEPENDENT_AMBULATORY_CARE_PROVIDER_SITE_OTHER): Payer: BC Managed Care – PPO | Admitting: Cardiology

## 2022-07-06 VITALS — BP 140/80 | HR 66 | Ht 70.5 in | Wt 192.0 lb

## 2022-07-06 DIAGNOSIS — I10 Essential (primary) hypertension: Secondary | ICD-10-CM

## 2022-07-06 DIAGNOSIS — E785 Hyperlipidemia, unspecified: Secondary | ICD-10-CM | POA: Diagnosis not present

## 2022-07-06 DIAGNOSIS — R0600 Dyspnea, unspecified: Secondary | ICD-10-CM

## 2022-07-06 DIAGNOSIS — Z951 Presence of aortocoronary bypass graft: Secondary | ICD-10-CM

## 2022-07-06 DIAGNOSIS — I739 Peripheral vascular disease, unspecified: Secondary | ICD-10-CM | POA: Diagnosis not present

## 2022-07-06 DIAGNOSIS — I2510 Atherosclerotic heart disease of native coronary artery without angina pectoris: Secondary | ICD-10-CM

## 2022-07-06 NOTE — Patient Instructions (Signed)
Medication Instructions:  Your physician recommends that you continue on your current medications as directed. Please refer to the Current Medication list given to you today.  *If you need a refill on your cardiac medications before your next appointment, please call your pharmacy*   Lab Work: Lipid, AST, ALT- Today If you have labs (blood work) drawn today and your tests are completely normal, you will receive your results only by: MyChart Message (if you have MyChart) OR A paper copy in the mail If you have any lab test that is abnormal or we need to change your treatment, we will call you to review the results.   Testing/Procedures: Your physician has requested that you have an echocardiogram. Echocardiography is a painless test that uses sound waves to create images of your heart. It provides your doctor with information about the size and shape of your heart and how well your heart's chambers and valves are working. This procedure takes approximately one hour. There are no restrictions for this procedure.    Follow-Up: At [facility], you and your health needs are our priority.  As part of our continuing mission to provide you with exceptional heart care, we have created designated Provider Care Teams.  These Care Teams include your primary Cardiologist (physician) and Advanced Practice Providers (APPs -  Physician Assistants and Nurse Practitioners) who all work together to provide you with the care you need, when you need it.  We recommend signing up for the patient portal called "MyChart".  Sign up information is provided on this After Visit Summary.  MyChart is used to connect with patients for Virtual Visits (Telemedicine).  Patients are able to view lab/test results, encounter notes, upcoming appointments, etc.  Non-urgent messages can be sent to your provider as well.   To learn more about what you can do with MyChart, go to [email or web address].    Your next appointment:    6 month(s)  The format for your next appointment:   In Person  Provider:   Robert Krasowski, MD    Other Instructions NA  

## 2022-07-06 NOTE — Addendum Note (Signed)
Addended by: Baldo Ash D on: 07/06/2022 08:46 AM   Modules accepted: Orders

## 2022-07-06 NOTE — Progress Notes (Signed)
Cardiology Office Note:    Date:  07/06/2022   ID:  Steven Ford, DOB Apr 05, 1961, MRN [id number]  PCP:  Carlean Jews, NP  Cardiologist:  Gypsy Balsam, MD    Referring MD: Carlean Jews, NP   Chief Complaint  Patient presents with   Follow-up    History of Present Illness:    Steven Ford is a 61 y.o. male  past medical history significant for coronary artery disease, status post coronary artery bypass graft years ago, cardiomyopathy with ejection fraction labral of 20%, peripheral vascular disease status post arthrectomy done on left SFA IM in March 2021 but then he required 3 intervention the summer at that time laser was performed and drug-eluting stent was implanted.  He does have dyslipidemia. He comes today for follow-up.  In May he did have his left hip replacement surgery very happy with and satisfied with the surgery.  Pain is dramatically subsided he started doing his exercises on the regular basis and doing very well denies have any chest pain tightness squeezing pressure burning chest.  Past Medical History:  Diagnosis Date   Cardiomyopathy (HCC) 02/12/2020   Echo 12/21/2019 showed decreased EF of 40%, grade 2DD, with global hypokinesia. Stress Myoview did not show evidence of ischemia.   Claudication in peripheral vascular disease (HCC) 07/15/2018   Peripheral arterial disease   Complication of anesthesia    Coronary artery disease    Coronary artery disease involving native coronary artery of native heart without angina pectoris 11/01/2015   Dyslipidemia 11/01/2015   Hyperlipidemia   Encounter to establish care 03/07/2021   High cholesterol    Hyperlipidemia    Hypertension    Migraine    "none in a long long while" (09/04/2018)   PONV (postoperative nausea and vomiting)    Preop cardiovascular exam 12/11/2019   Primary osteoarthritis of right knee 03/07/2021   PVD (peripheral vascular disease) (HCC)    Status post coronary artery bypass  graft 11/01/2015   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    Past Surgical History:  Procedure Laterality Date   ABDOMINAL AORTOGRAM W/LOWER EXTREMITY  02/18/2020   ABDOMINAL AORTOGRAM W/LOWER EXTREMITY    ABDOMINAL AORTOGRAM W/LOWER EXTREMITY Right 02/18/2020   Procedure: ABDOMINAL AORTOGRAM W/LOWER EXTREMITY;  Surgeon: Runell Gess, MD;  Location: [facility];  Service: Cardiovascular;  Laterality: Right;   ABDOMINAL AORTOGRAM W/LOWER EXTREMITY Right 05/23/2020   Procedure: ABDOMINAL AORTOGRAM W/LOWER EXTREMITY;  Surgeon: Runell Gess, MD;  Location: [facility];  Service: Cardiovascular;  Laterality: Right;   ACHILLES TENDON REPAIR Right 2010   CORONARY ARTERY BYPASS GRAFT  2002   "triple"   KNEE ARTHROSCOPY Right 2019   LOWER EXTREMITY ANGIOGRAPHY N/A 08/28/2018   Procedure: LOWER EXTREMITY ANGIOGRAPHY;  Surgeon: Runell Gess, MD;  Location: [facility];  Service: Cardiovascular;  Laterality: N/A;   LOWER EXTREMITY ANGIOGRAPHY N/A 09/04/2018   Procedure: LOWER EXTREMITY ANGIOGRAPHY;  Surgeon: Runell Gess, MD;  Location: [facility];  Service: Cardiovascular;  Laterality: N/A;   PERIPHERAL VASCULAR ATHERECTOMY  02/18/2020   Procedure: PERIPHERAL VASCULAR ATHERECTOMY;  Surgeon: Runell Gess, MD;  Location: [facility];  Service: Cardiovascular;;  rt SFA   PERIPHERAL VASCULAR ATHERECTOMY  05/23/2020   Procedure: PERIPHERAL VASCULAR ATHERECTOMY;  Surgeon: Runell Gess, MD;  Location: [facility];  Service: Cardiovascular;;  Laser-Left SFA   PERIPHERAL VASCULAR BALLOON ANGIOPLASTY  05/23/2020   Procedure: PERIPHERAL VASCULAR BALLOON  ANGIOPLASTY;  Surgeon: Runell Gess, MD;  Location: [facility] INV[facility] CV [facility];  Service: Cardiovascular;;  Left SFA   PERIPHERAL VASCULAR INTERVENTION Left 09/04/2018   PERIPHERAL VASCULAR INTERVENTION Left 09/04/2018   Procedure: PERIPHERAL VASCULAR INTERVENTION;  Surgeon: Runell Gess, MD;  Location: [facility];  Service: Cardiovascular;  Laterality: Left;   PERIPHERAL VASCULAR INTERVENTION  02/18/2020   Procedure: PERIPHERAL VASCULAR INTERVENTION;  Surgeon: Runell Gess, MD;  Location: [facility];  Service: Cardiovascular;;  rt SFA   TOTAL HIP ARTHROPLASTY Left 04/25/2022   Procedure: TOTAL HIP ARTHROPLASTY;  Surgeon: Joen Laura, MD;  Location: [facility];  Service: Orthopedics;  Laterality: Left;    Current Medications: Current Meds  Medication Sig   atorvastatin (LIPITOR) 80 MG tablet TAKE 1 TABLET BY MOUTH EVERY DAY   Multiple Vitamins-Minerals (MULTIVITAMIN WITH MINERALS) tablet Take 1 tablet by mouth in the morning.   sacubitril-valsartan (ENTRESTO) 24-26 MG Take 1 tablet by mouth daily.     Allergies:   Influenza vaccines   Social History   Socioeconomic History   Marital status: Married    Spouse name: Not on file   Number of children: Not on file   Years of education: Not on file   Highest education level: Not on file  Occupational History   Not on file  Tobacco Use   Smoking status: Some Days    Types: Cigars   Smokeless tobacco: Never   Tobacco comments:    09/04/2018 "q time I play golf"  Vaping Use   Vaping Use: Never used  Substance and Sexual Activity   Alcohol use: Yes    Alcohol/week: 2.0 standard drinks of alcohol    Types: 2 Cans of beer per week    Comment: occas.   Drug use: Not Currently   Sexual activity: Yes    Partners: Female  Other Topics Concern   Not on file  Social History Narrative   Not on file   Social Determinants of Health   Financial Resource Strain: Not on file  Food Insecurity: Not on file  Transportation Needs: Not on file  Physical Activity: Not on file  Stress: Not on file  Social Connections: Not on file     Family History: The patient's family history includes Anemia in his father; Heart disease in his mother; High blood pressure in his mother. ROS:   Please  see the history of present illness.    All 14 point review of systems negative except as described per history of present illness  EKGs/Labs/Other Studies Reviewed:      Recent Labs: 04/18/2022: BUN 14; Creatinine, Ser 0.80; Hemoglobin 14.9; Platelets 254; Potassium 4.0; Sodium 142  Recent Lipid Panel    Component Value Date/Time   CHOL 134 06/01/2021 1019   TRIG 54 06/01/2021 1019   HDL 43 06/01/2021 1019   CHOLHDL 3.1 06/01/2021 1019   LDLCALC 79 06/01/2021 1019   LDLDIRECT 79 02/02/2022 0853    Physical Exam:    VS:  BP (!) 140/80 (BP Location: Right Arm, Patient Position: Sitting, Cuff Size: Normal)   Pulse 66   Ht 5' 10.5" (1.791 m)   Wt 192 lb (87.1 kg)   SpO2 93%   BMI 27.16 kg/m     Wt Readings from Last 3 Encounters:  07/06/22 192 lb (87.1 kg)  04/25/22 197 lb (89.4 kg)  04/18/22 197 lb (89.4 kg)     GEN:  Well nourished, well developed in no  acute distress HEENT: Normal NECK: No JVD; No carotid bruits LYMPHATICS: No lymphadenopathy CARDIAC: RRR, no murmurs, no rubs, no gallops RESPIRATORY:  Clear to auscultation without rales, wheezing or rhonchi  ABDOMEN: Soft, non-tender, non-distended MUSCULOSKELETAL:  No edema; No deformity  SKIN: Warm and dry LOWER EXTREMITIES: no swelling NEUROLOGIC:  Alert and oriented x 3 PSYCHIATRIC:  Normal affect   ASSESSMENT:    1. Coronary artery disease involving native coronary artery of native heart without angina pectoris   2. PVD (peripheral vascular disease) (HCC)   3. Primary hypertension   4. Dyslipidemia   5. Status post coronary artery bypass graft    PLAN:    In order of problems listed above:  Coronary artery disease stable from that point review on antiplatelet therapy which I will continue Cardiomyopathy with ejection fraction 4045%, he is on Entresto however I discovered that he takes only Entresto once a day.  I will ask him to have another echocardiogram done to see what his ejection fraction is  clearly we have to maximize his therapy to which standards for guideline directed medical therapy. Vascular disease.  That being followed by Dr. Nanetta Batty.  He is stable doing well asymptomatic This could edema did review his K PN which show me data from 2023 March with LDL of 79.  I wish to see it less than 70 a favorable less than 55.  He is already on high intense statin form of Lipitor 80 which I will continue.  I will ask him to have fasting lipid profile done today.   Medication Adjustments/Labs and Tests Ordered: Current medicines are reviewed at length with the patient today.  Concerns regarding medicines are outlined above.  No orders of the defined types were placed in this encounter.  Medication changes: No orders of the defined types were placed in this encounter.   Signed, Georgeanna Lea, MD, [facility] 07/06/2022 8:29 AM    [facility]

## 2022-07-07 LAB — LIPID PANEL
Chol/HDL Ratio: 3.2 ratio (ref 0.0–5.0)
Cholesterol, Total: 127 mg/dL (ref 100–199)
HDL: 40 mg/dL (ref >39)
LDL Chol Calc (NIH): 75 mg/dL (ref 0–99)
Triglycerides: 52 mg/dL (ref 0–149)
VLDL Cholesterol Cal: 12 mg/dL (ref 5–40)

## 2022-07-07 LAB — ALT: ALT: 22 IU/L (ref 0–44)

## 2022-07-07 LAB — AST: AST: 23 IU/L (ref 0–40)

## 2022-07-12 ENCOUNTER — Ambulatory Visit (INDEPENDENT_AMBULATORY_CARE_PROVIDER_SITE_OTHER): Payer: BC Managed Care – PPO

## 2022-07-12 DIAGNOSIS — I2510 Atherosclerotic heart disease of native coronary artery without angina pectoris: Secondary | ICD-10-CM

## 2022-07-12 DIAGNOSIS — R0600 Dyspnea, unspecified: Secondary | ICD-10-CM

## 2022-07-12 LAB — ECHOCARDIOGRAM COMPLETE
Area-P 1/2: 3.06 cm2
S' Lateral: 4.3 cm

## 2022-07-12 MED ORDER — EZETIMIBE 10 MG PO TABS: 10.0000 mg | ORAL_TABLET | Freq: Every day | ORAL | 3 refills | Status: DC

## 2022-07-12 NOTE — Addendum Note (Signed)
Addended by: Eleonore Chiquito on: 07/12/2022 04:43 PM   Modules accepted: Orders

## 2022-07-17 ENCOUNTER — Other Ambulatory Visit: Payer: Self-pay | Admitting: Cardiology

## 2022-07-18 ENCOUNTER — Telehealth: Payer: Self-pay

## 2022-07-18 NOTE — Telephone Encounter (Signed)
Entresto 24 - 26 mg # 180 x 3 refills sent to [id number] - [place], [place] - [address]

## 2022-07-18 NOTE — Telephone Encounter (Signed)
Patient notified of results.

## 2022-07-18 NOTE — Telephone Encounter (Signed)
-----   Message from Georgeanna Lea, MD sent at 07/13/2022  8:47 AM EDT ----- Echocardiogram showed normal left ventricle ejection fraction, overall looks good

## 2022-08-05 IMAGING — DX DG PORTABLE PELVIS
2 series · 2 of 2 positions shown · non-contrast
Comparison: None Available.

CLINICAL DATA: Intraoperative image.

EXAM:
PORTABLE PELVIS 1-2 VIEWS

[pelvis ap]
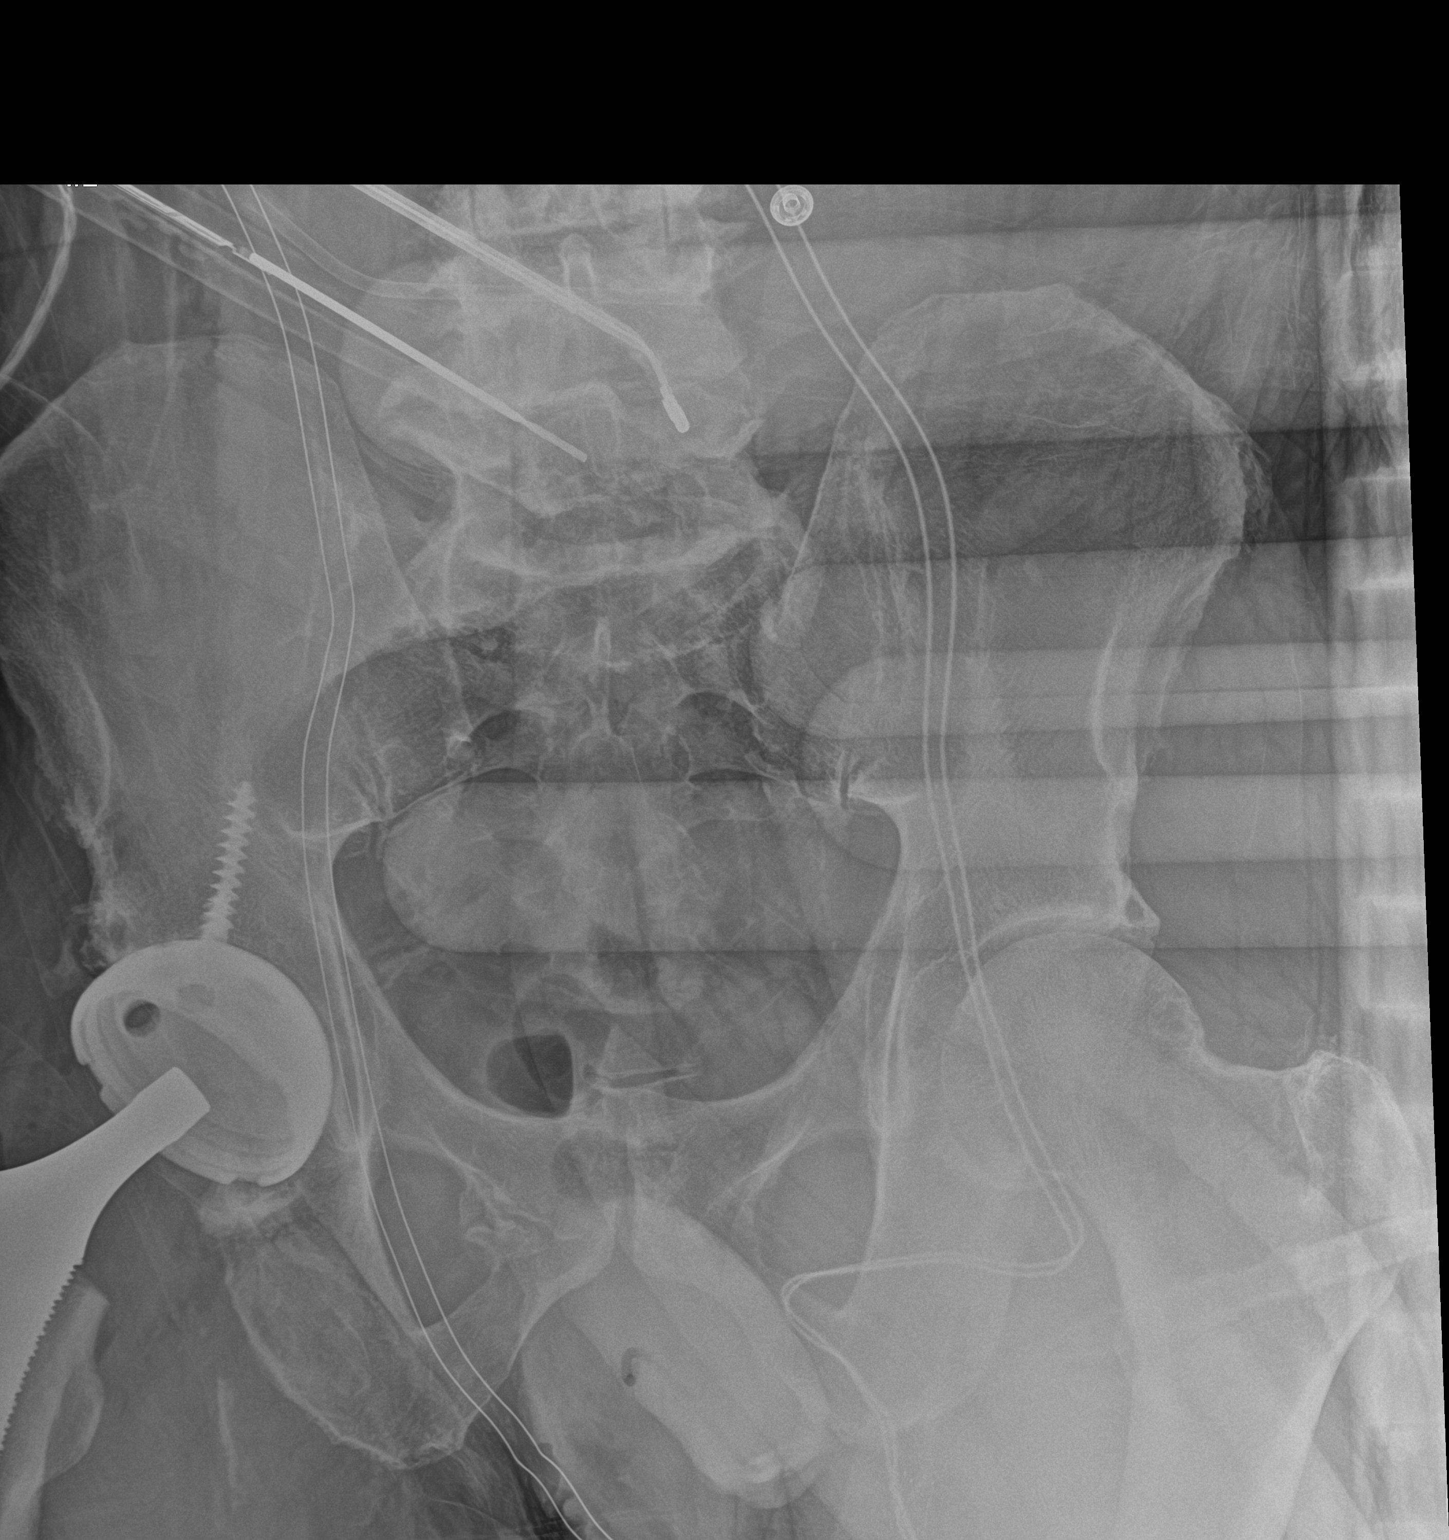

[pelvis lat]
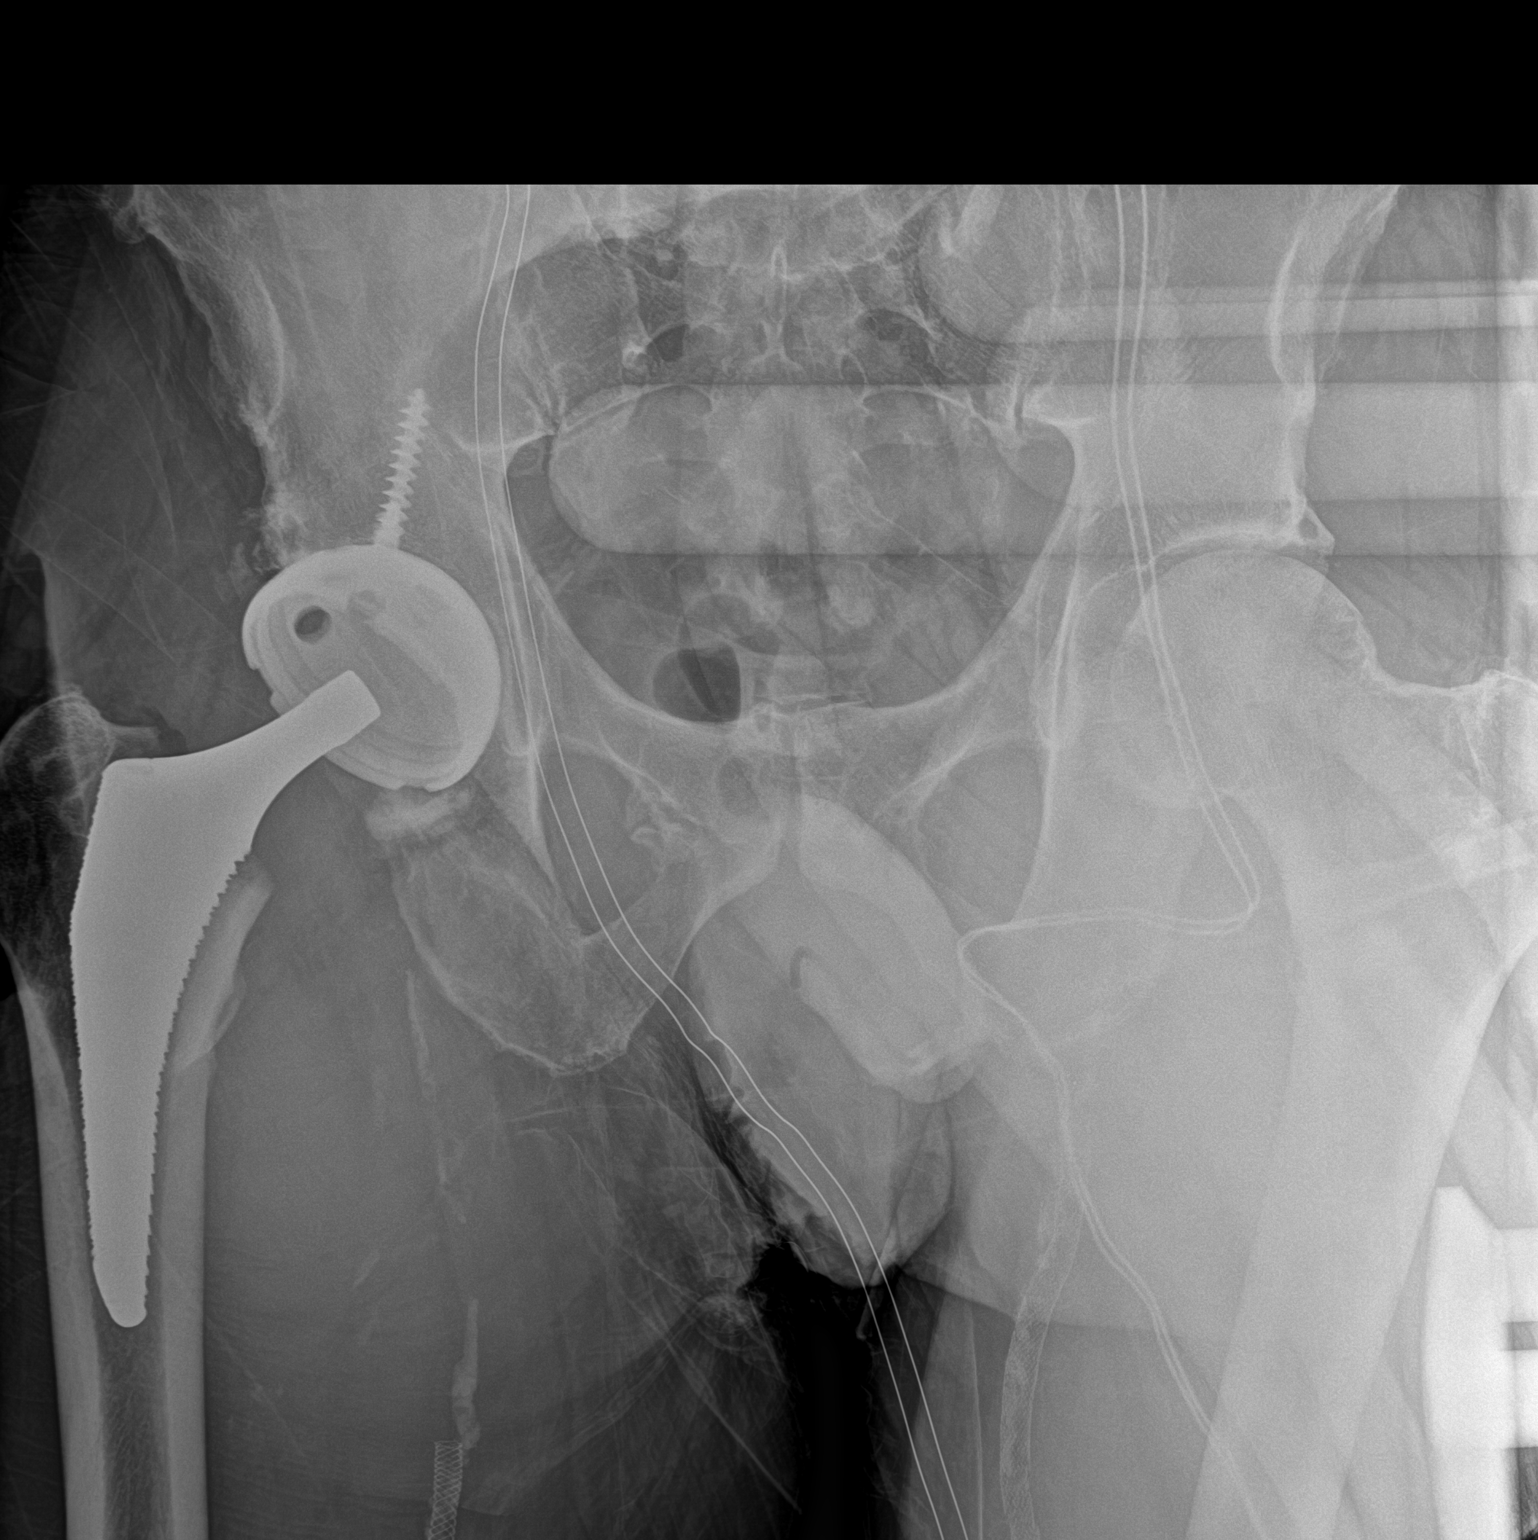

[2 of 2 positions shown; findings below may reference images not displayed]

FINDINGS: Two intraoperative fluoroscopic images were obtained. Patient is
undergoing right total hip arthroplasty. Right acetabular and
femoral components are noted. On the first image, 2 surgical
instruments are seen positioned over the lower lumbar spine.
Overlying metallic wiring or other probable external artifact is
noted. No other definite radiopaque foreign body is noted.
IMPRESSION: Femoral and acetabular components of right hip arthroplasty are
noted. Two surgical tools are seen overlying lower lumbar spine on
first image. Probable overlying linear metallic external object is
noted. There is no indication on the notes for this exam indicating
that this is a call report. No phone number or OR number is listed
either.

## 2022-10-23 ENCOUNTER — Ambulatory Visit (INDEPENDENT_AMBULATORY_CARE_PROVIDER_SITE_OTHER): Payer: BC Managed Care – PPO | Admitting: Nurse Practitioner

## 2022-10-23 ENCOUNTER — Encounter: Payer: Self-pay | Admitting: Nurse Practitioner

## 2022-10-23 VITALS — BP 146/93 | HR 64 | Ht 70.5 in | Wt 200.1 lb

## 2022-10-23 DIAGNOSIS — M5441 Lumbago with sciatica, right side: Secondary | ICD-10-CM

## 2022-10-23 DIAGNOSIS — R319 Hematuria, unspecified: Secondary | ICD-10-CM

## 2022-10-23 LAB — POCT URINALYSIS DIP (CLINITEK)
Bilirubin, UA: NEGATIVE
Glucose, UA: NEGATIVE mg/dL
Ketones, POC UA: NEGATIVE mg/dL
Leukocytes, UA: NEGATIVE
Nitrite, UA: NEGATIVE
POC PROTEIN,UA: NEGATIVE
Spec Grav, UA: 1.025 (ref 1.010–1.025)
Urobilinogen, UA: 0.2 E.U./dL
pH, UA: 5.5 (ref 5.0–8.0)

## 2022-10-23 MED ORDER — CYCLOBENZAPRINE HCL 10 MG PO TABS: 10.0000 mg | ORAL_TABLET | Freq: Every evening | ORAL | 1 refills | Status: DC | PRN

## 2022-10-23 MED ORDER — AMOXICILLIN-POT CLAVULANATE 875-125 MG PO TABS
1.0000 | ORAL_TABLET | Freq: Two times a day (BID) | ORAL | 0 refills | Status: DC
Start: 2022-10-23 — End: 2023-01-09

## 2022-10-23 NOTE — Progress Notes (Signed)
Established patient visit   Patient: Steven Ford   DOB: 1961-06-09   61 y.o. Male  MRN: [id number] Visit Date: 10/23/2022  Chief Complaint  Patient presents with   Flank Pain   Subjective    Abdominal Pain This is a new problem. The current episode started 1 to 4 weeks ago. The onset quality is sudden. The problem occurs 2 to 4 times per day. The pain is located in the right flank. The quality of the pain is sharp. The abdominal pain radiates to the back and right flank. Associated symptoms include nausea. Pertinent negatives include no constipation, diarrhea, dysuria, fever, frequency, headaches, myalgias or vomiting. The pain is aggravated by certain positions. The pain is relieved by Being still, sitting up and recumbency. He has tried nothing for the symptoms.        Medications: Outpatient Medications Prior to Visit  Medication Sig   atorvastatin (LIPITOR) 80 MG tablet TAKE 1 TABLET BY MOUTH EVERY DAY   ENTRESTO 24-26 MG TAKE 1 TABLET BY MOUTH TWICE A DAY. DO NOT START UNTIL 48 HOURS AFTER LAST LISINOPRIL DOSE   ezetimibe (ZETIA) 10 MG tablet Take 1 tablet (10 mg total) by mouth daily.   Multiple Vitamins-Minerals (MULTIVITAMIN WITH MINERALS) tablet Take 1 tablet by mouth in the morning.   No facility-administered medications prior to visit.    Review of Systems  Constitutional:  Negative for activity change, chills, fatigue and fever.  HENT:  Negative for congestion, postnasal drip, rhinorrhea, sinus pressure, sinus pain, sneezing and sore throat.   Eyes: Negative.   Respiratory:  Negative for cough, shortness of breath and wheezing.   Cardiovascular:  Negative for chest pain and palpitations.  Gastrointestinal:  Positive for abdominal pain and nausea. Negative for constipation, diarrhea and vomiting.  Endocrine: Negative for cold intolerance, heat intolerance, polydipsia and polyuria.  Genitourinary:  Positive for flank pain. Negative for dysuria, frequency and  urgency.  Musculoskeletal:  Negative for back pain and myalgias.  Skin:  Negative for rash.  Allergic/Immunologic: Negative for environmental allergies.  Neurological:  Negative for dizziness, weakness and headaches.  Psychiatric/Behavioral:  The patient is not nervous/anxious.      Objective     Today's Vitals   10/23/22 1003  BP: (Abnormal) 146/93  Pulse: 64  SpO2: 97%  Weight: 200 lb 1.9 oz (90.8 kg)  Height: 5' 10.5" (1.791 m)   Body mass index is 28.31 kg/m.   Physical Exam Vitals and nursing note reviewed.  Constitutional:      Appearance: Normal appearance. He is well-developed.  HENT:     Head: Normocephalic and atraumatic.     Nose: Nose normal.     Mouth/Throat:     Mouth: Mucous membranes are moist.     Pharynx: Oropharynx is clear.  Eyes:     Extraocular Movements: Extraocular movements intact.     Conjunctiva/sclera: Conjunctivae normal.     Pupils: Pupils are equal, round, and reactive to light.  Cardiovascular:     Rate and Rhythm: Normal rate and regular rhythm.     Pulses: Normal pulses.     Heart sounds: Normal heart sounds.  Pulmonary:     Effort: Pulmonary effort is normal.     Breath sounds: Normal breath sounds.  Abdominal:     General: Bowel sounds are normal.     Palpations: Abdomen is soft.     Tenderness: There is abdominal tenderness. There is right CVA tenderness.  Genitourinary:    Comments: Urine sample  positive for  trace blood  Musculoskeletal:        General: Tenderness present. Normal range of motion.     Cervical back: Normal range of motion and neck supple.       Back:  Lymphadenopathy:     Cervical: No cervical adenopathy.  Skin:    General: Skin is warm and dry.     Capillary Refill: Capillary refill takes less than 2 seconds.  Neurological:     General: No focal deficit present.     Mental Status: He is alert and oriented to person, place, and time.  Psychiatric:        Mood and Affect: Mood normal.         Behavior: Behavior normal.        Thought Content: Thought content normal.        Judgment: Judgment normal.      Results for orders placed or performed in visit on 10/23/22  Urine Culture   Specimen: Urine   Urine  Result Value Ref Range   Urine Culture, Routine Final report    Organism ID, Bacteria No growth   POCT URINALYSIS DIP (CLINITEK)  Result Value Ref Range   Color, UA yellow yellow   Clarity, UA clear clear   Glucose, UA negative negative mg/dL   Bilirubin, UA negative negative   Ketones, POC UA negative negative mg/dL   Spec Grav, UA 8.657 8.469 - 1.025   Blood, UA trace-lysed (A) negative   pH, UA 5.5 5.0 - 8.0   POC PROTEIN,UA negative negative, trace   Urobilinogen, UA 0.2 0.2 or 1.0 E.U./dL   Nitrite, UA Negative Negative   Leukocytes, UA Negative Negative    Assessment & Plan     1. Acute right-sided low back pain with right-sided sciatica This is most likely musculoskeletal in nature. Treat with OTC tylenol and ibuprofen as needed and as indicated. May take flexeril at bedtime as needed to relieve tight muscles. Stretching exercises discussed and written information was provided   - cyclobenzaprine (FLEXERIL) 10 MG tablet; Take 1 tablet (10 mg total) by mouth at bedtime as needed for muscle spasms.  Dispense: 30 tablet; Refill: 1  2. Hematuria, unspecified type Trace of blood noted in urine sample. Will treat for possible infection with augmentin twice daily for 5 days. Will send urine for culture and sensitivity and adjust treatment as indicated.  - POCT URINALYSIS DIP (CLINITEK) - Urine Culture; Future - Urine Culture - amoxicillin-clavulanate (AUGMENTIN) 875-125 MG tablet; Take 1 tablet by mouth 2 (two) times daily.  Dispense: 20 tablet; Refill: 0   Return for prn worsening or persistent symptoms.        Carlean Jews, NP  [facility] [phone] (phone) [phone] (fax)  [facility]

## 2022-10-25 LAB — URINE CULTURE: Organism ID, Bacteria: NO GROWTH

## 2022-10-31 ENCOUNTER — Ambulatory Visit: Payer: BC Managed Care – PPO | Admitting: Nurse Practitioner

## 2022-11-04 DIAGNOSIS — R319 Hematuria, unspecified: Secondary | ICD-10-CM | POA: Insufficient documentation

## 2022-11-04 DIAGNOSIS — M5441 Lumbago with sciatica, right side: Secondary | ICD-10-CM | POA: Insufficient documentation

## 2022-12-19 ENCOUNTER — Other Ambulatory Visit: Payer: Self-pay | Admitting: Cardiology

## 2022-12-27 ENCOUNTER — Telehealth: Payer: Self-pay | Admitting: *Deleted

## 2022-12-27 NOTE — Telephone Encounter (Signed)
   Pre-operative Risk Assessment    Patient Name: Steven Ford  DOB: 1961-02-27 MRN: [id number]      Request for Surgical Clearance    Procedure:   RIGHT TOTAL KNEE ARTHROPLASTY  Date of Surgery:  Clearance TBD                                 Surgeon:  DR. DANIEL MARCHWIANY Surgeon's Group or Practice Name:  [facility] Phone number:  [phone] EXT [phone] ATTN: KELLY HIGH Fax number:  [phone]   Type of Clearance Requested:   - Medical ; NO MEDICATIONS LISTED AS NEEDING TO BE HELD   Type of Anesthesia:  Spinal   Additional requests/questions:    Steven Ford   12/27/2022, 5:26 PM

## 2022-12-28 ENCOUNTER — Telehealth: Payer: Self-pay | Admitting: Cardiology

## 2022-12-28 NOTE — Telephone Encounter (Signed)
LVM for pt to call and schedule pre op clearance appt/kbl 12/28/22

## 2022-12-28 NOTE — Telephone Encounter (Signed)
I will send a message to the [place] scheduling team to help with an appt for pre op clearance.

## 2022-12-28 NOTE — Telephone Encounter (Signed)
Lenise Herald, CMA LVM for pt to call and schedule appt- not a lot to choose from with Dr Marthann Schiller schedule so double and triple booked.  Thank you!

## 2022-12-28 NOTE — Telephone Encounter (Signed)
   Name: ESAIAH WANLESS  DOB: [birth year]  MRN: [id number]  Primary Cardiologist: Jenne Campus, MD  Chart reviewed as part of pre-operative protocol coverage. Because of Mare Ludtke Pickney's past medical history and time since last visit, he will require a follow-up in-office visit in order to better assess preoperative cardiovascular risk.  Pre-op covering staff: - Please schedule appointment and call patient to inform them. If patient already had an upcoming appointment within acceptable timeframe, please add "pre-op clearance" to the appointment notes so provider is aware. - Please contact requesting surgeon's office via preferred method (i.e, phone, fax) to inform them of need for appointment prior to surgery.   [place], [place]  12/28/2022, 7:54 AM

## 2022-12-28 NOTE — Telephone Encounter (Signed)
-----   Message from Michae Kava, [place] sent at 12/28/2022  4:53 PM EST ----- Regarding: PRE OP APPT HELP PLEASE :) Hi everyone,   I know you all get tire of me, I am sorry. But can someone please help find an appt. I appreciate any help you can give.   Thank you Steven Ford    I will send a message to the [place] scheduling team to help with an appt for pre op clearance.

## 2022-12-31 NOTE — Telephone Encounter (Signed)
Pt has appt 01/31/23 with Dr. Agustin Cree. I will update all parties involved.

## 2023-01-09 ENCOUNTER — Ambulatory Visit: Payer: BC Managed Care – PPO | Admitting: Nurse Practitioner

## 2023-01-09 ENCOUNTER — Encounter: Payer: Self-pay | Admitting: Nurse Practitioner

## 2023-01-09 VITALS — BP 114/73 | HR 68 | Ht 70.5 in | Wt 199.0 lb

## 2023-01-09 DIAGNOSIS — I2510 Atherosclerotic heart disease of native coronary artery without angina pectoris: Secondary | ICD-10-CM | POA: Diagnosis not present

## 2023-01-09 DIAGNOSIS — Z01818 Encounter for other preprocedural examination: Secondary | ICD-10-CM

## 2023-01-09 DIAGNOSIS — T148XXA Other injury of unspecified body region, initial encounter: Secondary | ICD-10-CM | POA: Diagnosis not present

## 2023-01-09 DIAGNOSIS — M1711 Unilateral primary osteoarthritis, right knee: Secondary | ICD-10-CM

## 2023-01-09 DIAGNOSIS — M545 Low back pain, unspecified: Secondary | ICD-10-CM

## 2023-01-09 MED ORDER — ETODOLAC 400 MG PO TABS: 400.0000 mg | ORAL_TABLET | Freq: Two times a day (BID) | ORAL | 0 refills | Status: DC | PRN

## 2023-01-09 MED ORDER — CYCLOBENZAPRINE HCL 10 MG PO TABS: 10.0000 mg | ORAL_TABLET | Freq: Every evening | ORAL | 1 refills | Status: DC | PRN

## 2023-01-09 MED ORDER — PREDNISONE 10 MG (21) PO TBPK
ORAL_TABLET | ORAL | 0 refills | Status: DC
Start: 2023-01-09 — End: 2023-01-31

## 2023-01-09 NOTE — Progress Notes (Signed)
Established patient visit   Patient: Steven Ford   DOB: 1961-10-19   62 y.o. Male  MRN: [id number] Visit Date: 01/09/2023   Chief Complaint  Patient presents with   Medical Clearance   Subjective    HPI  Surgical clearance  -right knee replacement on approximately 04/26/2023.  -does have history of heart failure. Will get clearance from cardiology prior to surgery -twisted while running on basketball court 4 or 5 days ago. Felt something "catch" in the right lower back.  -has been bothering him ever since  -has taken some aleve or ibuprofen. This has not helped much.  He denies chest pain, chest pressure, or shortness of breath. He denies headaches or visual disturbances. He denies abdominal pain, nausea, vomiting, or changes in bowel or bladder habits.     Medications: Outpatient Medications Prior to Visit  Medication Sig   atorvastatin (LIPITOR) 80 MG tablet Take 1 tablet (80 mg total) by mouth daily.   ENTRESTO 24-26 MG TAKE 1 TABLET BY MOUTH TWICE A DAY. DO NOT START UNTIL 48 HOURS AFTER LAST LISINOPRIL DOSE   ezetimibe (ZETIA) 10 MG tablet Take 1 tablet (10 mg total) by mouth daily.   Multiple Vitamins-Minerals (MULTIVITAMIN WITH MINERALS) tablet Take 1 tablet by mouth in the morning.   [DISCONTINUED] amoxicillin-clavulanate (AUGMENTIN) 875-125 MG tablet Take 1 tablet by mouth 2 (two) times daily.   [DISCONTINUED] cyclobenzaprine (FLEXERIL) 10 MG tablet Take 1 tablet (10 mg total) by mouth at bedtime as needed for muscle spasms.   No facility-administered medications prior to visit.    Review of Systems  Constitutional:  Negative for activity change, chills, fatigue and fever.  HENT:  Negative for congestion, postnasal drip, rhinorrhea, sinus pressure, sinus pain, sneezing and sore throat.   Eyes: Negative.   Respiratory:  Negative for cough, shortness of breath and wheezing.   Cardiovascular:  Negative for chest pain and palpitations.  Gastrointestinal:  Negative  for constipation, diarrhea, nausea and vomiting.  Endocrine: Negative for cold intolerance, heat intolerance, polydipsia and polyuria.  Genitourinary:  Negative for dysuria, frequency and urgency.  Musculoskeletal:  Positive for arthralgias, back pain and myalgias.  Skin:  Negative for rash.  Allergic/Immunologic: Negative for environmental allergies.  Neurological:  Negative for dizziness, weakness and headaches.  Psychiatric/Behavioral:  The patient is not nervous/anxious.     Last CBC Lab Results  Component Value Date   WBC 5.4 04/18/2022   HGB 14.9 04/18/2022   HCT 44.7 04/18/2022   MCV 93.3 04/18/2022   MCH 31.1 04/18/2022   RDW 13.4 04/18/2022   PLT 254 [id number]   Last metabolic panel Lab Results  Component Value Date   GLUCOSE 107 (H) 04/18/2022   NA 142 04/18/2022   K 4.0 04/18/2022   CL 112 (H) 04/18/2022   CO2 26 04/18/2022   BUN 14 04/18/2022   CREATININE 0.80 04/18/2022   GFRNONAA >60 04/18/2022   CALCIUM 8.8 (L) 04/18/2022   PROT 6.6 06/01/2021   ALBUMIN 4.5 06/01/2021   LABGLOB 2.1 06/01/2021   AGRATIO 2.1 06/01/2021   BILITOT 0.3 06/01/2021   ALKPHOS 109 06/01/2021   AST 23 07/06/2022   ALT 22 07/06/2022   ANIONGAP 4 (L) 04/18/2022   Last lipids Lab Results  Component Value Date   CHOL 127 07/06/2022   HDL 40 07/06/2022   LDLCALC 75 07/06/2022   LDLDIRECT 79 02/02/2022   TRIG 52 07/06/2022   CHOLHDL 3.2 07/06/2022   Last hemoglobin A1c Lab Results  Component Value  Date   HGBA1C 5.9 (H) 06/01/2021   Last thyroid functions Lab Results  Component Value Date   TSH 1.970 06/01/2021       Objective     Today's Vitals   01/09/23 1455  BP: 114/73  Pulse: 68  SpO2: 97%  Weight: 199 lb (90.3 kg)  Height: 5' 10.5" (1.791 m)   Body mass index is 28.15 kg/m.  BP Readings from Last 3 Encounters:  01/09/23 114/73  10/23/22 (Abnormal) 146/93  07/06/22 (Abnormal) 140/80    Wt Readings from Last 3 Encounters:  01/09/23 199 lb (90.3  kg)  10/23/22 200 lb 1.9 oz (90.8 kg)  07/06/22 192 lb (87.1 kg)    Physical Exam Vitals and nursing note reviewed.  Constitutional:      Appearance: Normal appearance. He is well-developed.  HENT:     Head: Normocephalic and atraumatic.  Eyes:     Pupils: Pupils are equal, round, and reactive to light.  Neck:     Vascular: No carotid bruit.  Cardiovascular:     Rate and Rhythm: Normal rate and regular rhythm.     Pulses: Normal pulses.     Heart sounds: Normal heart sounds.  Pulmonary:     Effort: Pulmonary effort is normal.     Breath sounds: Normal breath sounds.  Abdominal:     Palpations: Abdomen is soft.  Musculoskeletal:        General: Normal range of motion.     Cervical back: Normal range of motion and neck supple.       Back:  Lymphadenopathy:     Cervical: No cervical adenopathy.  Skin:    General: Skin is warm and dry.     Capillary Refill: Capillary refill takes less than 2 seconds.  Neurological:     General: No focal deficit present.     Mental Status: He is alert and oriented to person, place, and time.  Psychiatric:        Mood and Affect: Mood normal.        Behavior: Behavior normal.        Thought Content: Thought content normal.        Judgment: Judgment normal.     Assessment & Plan    1. Encounter for preoperative examination for general surgical procedure Patient to have total right knee replacement around 04/26/2023. He is clear for surgery from medical perspective with moderate risk due to history of CAD. He will have additional clearance from cardiology and vascular surgery.   2. Acute right-sided low back pain without sciatica May take lodine 400 mg twice daily as needed to reduce pai and inflammation. Flexeril 10 mg may be taken at bedtime if needed for muscle pain and tightness.  - etodolac (LODINE) 400 MG tablet; Take 1 tablet (400 mg total) by mouth 2 (two) times daily as needed for mild pain.  Dispense: 45 tablet; Refill: 0 -  cyclobenzaprine (FLEXERIL) 10 MG tablet; Take 1 tablet (10 mg total) by mouth at bedtime as needed for muscle spasms.  Dispense: 30 tablet; Refill: 1  3. Muscle strain Add prednisone taper. Take as directed for 6 days. May take lodine 400 mg twice daily as needed to reduce pai and inflammation. Flexeril 10 mg may be taken at bedtime if needed for muscle pain and tightness.  - predniSONE (STERAPRED UNI-PAK 21 TAB) 10 MG (21) TBPK tablet; 6 day taper - take by mouth as directed for 6 days  Dispense: 21 tablet; Refill: 0 -  etodolac (LODINE) 400 MG tablet; Take 1 tablet (400 mg total) by mouth 2 (two) times daily as needed for mild pain.  Dispense: 45 tablet; Refill: 0 - cyclobenzaprine (FLEXERIL) 10 MG tablet; Take 1 tablet (10 mg total) by mouth at bedtime as needed for muscle spasms.  Dispense: 30 tablet; Refill: 1  4. Primary osteoarthritis of right knee Patient set to have total knee replacement of right knee around 04/26/2023.   5. Coronary artery disease involving native coronary artery of native heart without angina pectoris Continue regular visits with cardiology as scheduled.     Problem List Items Addressed This Visit       Cardiovascular and Mediastinum   Coronary artery disease involving native coronary artery of native heart without angina pectoris     Musculoskeletal and Integument   Primary osteoarthritis of right knee   Relevant Medications   predniSONE (STERAPRED UNI-PAK 21 TAB) 10 MG (21) TBPK tablet   etodolac (LODINE) 400 MG tablet   cyclobenzaprine (FLEXERIL) 10 MG tablet   Muscle strain   Relevant Medications   predniSONE (STERAPRED UNI-PAK 21 TAB) 10 MG (21) TBPK tablet   etodolac (LODINE) 400 MG tablet   cyclobenzaprine (FLEXERIL) 10 MG tablet     Other   Acute right-sided low back pain without sciatica   Relevant Medications   predniSONE (STERAPRED UNI-PAK 21 TAB) 10 MG (21) TBPK tablet   etodolac (LODINE) 400 MG tablet   cyclobenzaprine (FLEXERIL) 10 MG  tablet   Other Visit Diagnoses     Encounter for preoperative examination for general surgical procedure    -  Primary        Return in about 8 months (around 09/09/2023) for health maintenance exam with blood work at time of visit .         Ronnell Freshwater, NP  [facility] [phone] (phone) [phone] (fax)  [place]

## 2023-01-15 NOTE — Telephone Encounter (Signed)
Pt has appt with Dr. Agustin Cree 01/31/23 for pre op clearance. I will send FYI to requesting office as they sent another request. Once pt has been cleared; Dr. Agustin Cree will have his nurse/CMA fax ov notes with clearance and any recommendations.

## 2023-01-28 ENCOUNTER — Other Ambulatory Visit: Payer: Self-pay | Admitting: Nurse Practitioner

## 2023-01-28 DIAGNOSIS — M545 Low back pain, unspecified: Secondary | ICD-10-CM

## 2023-01-28 DIAGNOSIS — T148XXA Other injury of unspecified body region, initial encounter: Secondary | ICD-10-CM

## 2023-01-31 ENCOUNTER — Ambulatory Visit: Payer: BC Managed Care – PPO | Attending: Cardiology | Admitting: Cardiology

## 2023-01-31 ENCOUNTER — Telehealth: Payer: Self-pay

## 2023-01-31 ENCOUNTER — Encounter: Payer: Self-pay | Admitting: Cardiology

## 2023-01-31 VITALS — BP 130/90 | HR 61 | Ht 70.5 in | Wt 200.0 lb

## 2023-01-31 DIAGNOSIS — I739 Peripheral vascular disease, unspecified: Secondary | ICD-10-CM

## 2023-01-31 DIAGNOSIS — R0609 Other forms of dyspnea: Secondary | ICD-10-CM

## 2023-01-31 DIAGNOSIS — Z951 Presence of aortocoronary bypass graft: Secondary | ICD-10-CM | POA: Diagnosis not present

## 2023-01-31 DIAGNOSIS — I10 Essential (primary) hypertension: Secondary | ICD-10-CM

## 2023-01-31 DIAGNOSIS — E785 Hyperlipidemia, unspecified: Secondary | ICD-10-CM

## 2023-01-31 DIAGNOSIS — I2510 Atherosclerotic heart disease of native coronary artery without angina pectoris: Secondary | ICD-10-CM

## 2023-01-31 MED ORDER — ENTRESTO 24-26 MG PO TABS: 1.0000 | ORAL_TABLET | Freq: Two times a day (BID) | ORAL | 3 refills | Status: DC

## 2023-01-31 NOTE — Progress Notes (Signed)
Cardiology Office Note:    Date:  01/31/2023   ID:  Steven Ford, DOB [birth year], MRN [id number]  PCP:  Ronnell Freshwater, NP  Cardiologist:  Jenne Campus, MD    Referring MD: Ronnell Freshwater, NP   Chief Complaint  Patient presents with   Pre-op Exam    Rt knee replacement    History of Present Illness:    Steven Ford is a 62 y.o. male   past medical history significant for coronary artery disease, status post coronary artery bypass graft years ago, cardiomyopathy with ejection fraction labral of 20%, peripheral vascular disease status post arthrectomy done on left SFA IM in March 2021 but then he required 3 intervention the summer at that time laser was performed and drug-eluting stent was implanted.  He does have dyslipidemia. He comes today for follow-up.  In May he did have his left hip replacement surgery very happy with and satisfied with the surgery. Comes today to months for follow-up.  He is scheduled to have left knee replacement surgery.  Overall he is doing very well cardiac wise denies of any chest pain tightness squeezing pressure burning chest in spite of fact that he does have a little pain in the knee he takes an milligrams of ibuprofen is able to walk exercise aggressively with no difficulties no chest pain tightness squeezing pressure burning chest sadly he ran out of Entresto and there was some issue with refilling the medication so for 3 weeks he has not been taking it  Past Medical History:  Diagnosis Date   Cardiomyopathy ([place]) 02/12/2020   Echo 12/21/2019 showed decreased EF of 40%, grade 2DD, with global hypokinesia. Stress Myoview did not show evidence of ischemia.   Claudication in peripheral vascular disease ([place]) 07/15/2018   Peripheral arterial disease   Complication of anesthesia    Coronary artery disease    Coronary artery disease involving native coronary artery of native heart without angina pectoris 11/01/2015   Dyslipidemia  11/01/2015   Hyperlipidemia   Encounter to establish care 03/07/2021   High cholesterol    Hyperlipidemia    Hypertension    Migraine    "none in a long long while" (09/04/2018)   PONV (postoperative nausea and vomiting)    Preop cardiovascular exam 12/11/2019   Primary osteoarthritis of right knee 03/07/2021   PVD (peripheral vascular disease) ([place])    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    Past Surgical History:  Procedure Laterality Date   ABDOMINAL AORTOGRAM W/LOWER EXTREMITY  02/18/2020   ABDOMINAL AORTOGRAM W/LOWER EXTREMITY    ABDOMINAL AORTOGRAM W/LOWER EXTREMITY Right 02/18/2020   Procedure: ABDOMINAL AORTOGRAM W/LOWER EXTREMITY;  Surgeon: Lorretta Harp, MD;  Location: [place] [facility];  Service: Cardiovascular;  Laterality: Right;   ABDOMINAL AORTOGRAM W/LOWER EXTREMITY Right 05/23/2020   Procedure: ABDOMINAL AORTOGRAM W/LOWER EXTREMITY;  Surgeon: Lorretta Harp, MD;  Location: [place] [facility];  Service: Cardiovascular;  Laterality: Right;   ACHILLES TENDON REPAIR Right 2010   CORONARY ARTERY BYPASS GRAFT  2002   "triple"   KNEE ARTHROSCOPY Right 2019   LOWER EXTREMITY ANGIOGRAPHY N/A 08/28/2018   Procedure: LOWER EXTREMITY ANGIOGRAPHY;  Surgeon: Lorretta Harp, MD;  Location: [place] [facility];  Service: Cardiovascular;  Laterality: N/A;   LOWER EXTREMITY ANGIOGRAPHY N/A 09/04/2018   Procedure: LOWER EXTREMITY ANGIOGRAPHY;  Surgeon: Lorretta Harp, MD;  Location: [place] [facility];  Service: Cardiovascular;  Laterality:  N/A;   PERIPHERAL VASCULAR ATHERECTOMY  02/18/2020   Procedure: PERIPHERAL VASCULAR ATHERECTOMY;  Surgeon: Lorretta Harp, MD;  Location: [place] [facility];  Service: Cardiovascular;;  rt SFA   PERIPHERAL VASCULAR ATHERECTOMY  05/23/2020   Procedure: PERIPHERAL VASCULAR ATHERECTOMY;  Surgeon: Lorretta Harp, MD;  Location: [facility];  Service:  Cardiovascular;;  Laser-Left SFA   PERIPHERAL VASCULAR BALLOON ANGIOPLASTY  05/23/2020   Procedure: PERIPHERAL VASCULAR BALLOON ANGIOPLASTY;  Surgeon: Lorretta Harp, MD;  Location: [place] [facility];  Service: Cardiovascular;;  Left SFA   PERIPHERAL VASCULAR INTERVENTION Left 09/04/2018   PERIPHERAL VASCULAR INTERVENTION Left 09/04/2018   Procedure: PERIPHERAL VASCULAR INTERVENTION;  Surgeon: Lorretta Harp, MD;  Location: [place] [facility];  Service: Cardiovascular;  Laterality: Left;   PERIPHERAL VASCULAR INTERVENTION  02/18/2020   Procedure: PERIPHERAL VASCULAR INTERVENTION;  Surgeon: Lorretta Harp, MD;  Location: [place] [facility];  Service: Cardiovascular;;  rt SFA   TOTAL HIP ARTHROPLASTY Left 04/25/2022   Procedure: TOTAL HIP ARTHROPLASTY;  Surgeon: Willaim Sheng, MD;  Location: [facility];  Service: Orthopedics;  Laterality: Left;    Current Medications: Current Meds  Medication Sig   atorvastatin (LIPITOR) 80 MG tablet Take 1 tablet (80 mg total) by mouth daily.   ezetimibe (ZETIA) 10 MG tablet Take 1 tablet (10 mg total) by mouth daily.   Multiple Vitamins-Minerals (MULTIVITAMIN WITH MINERALS) tablet Take 1 tablet by mouth in the morning.     Allergies:   Influenza vaccines   Social History   Socioeconomic History   Marital status: Married    Spouse name: Not on file   Number of children: Not on file   Years of education: Not on file   Highest education level: Not on file  Occupational History   Not on file  Tobacco Use   Smoking status: Some Days    Types: Cigars   Smokeless tobacco: Never   Tobacco comments:    09/04/2018 "q time I play golf"  Vaping Use   Vaping Use: Never used  Substance and Sexual Activity   Alcohol use: Yes    Alcohol/week: 2.0 standard drinks of alcohol    Types: 2 Cans of beer per week    Comment: occas.   Drug use: Not Currently   Sexual activity: Yes    Partners: Female  Other Topics Concern   Not on file  Social  History Narrative   Not on file   Social Determinants of Health   Financial Resource Strain: Not on file  Food Insecurity: Not on file  Transportation Needs: Not on file  Physical Activity: Not on file  Stress: Not on file  Social Connections: Not on file     Family History: The patient's family history includes Anemia in his father; Heart disease in his mother; High blood pressure in his mother. ROS:   Please see the history of present illness.    All 14 point review of systems negative except as described per history of present illness  EKGs/Labs/Other Studies Reviewed:      Recent Labs: 04/18/2022: BUN 14; Creatinine, Ser 0.80; Hemoglobin 14.9; Platelets 254; Potassium 4.0; Sodium 142 07/06/2022: ALT 22  Recent Lipid Panel    Component Value Date/Time   CHOL 127 07/06/2022 0846   TRIG 52 07/06/2022 0846   HDL 40 07/06/2022 0846   CHOLHDL 3.2 07/06/2022 0846   LDLCALC 75 07/06/2022 0846   LDLDIRECT 79 02/02/2022 0853    Physical  Exam:    VS:  BP (!) 130/90 (BP Location: Right Arm, Patient Position: Sitting, Cuff Size: Normal)   Pulse 61   Ht 5' 10.5" (1.791 m)   Wt 200 lb (90.7 kg)   SpO2 96%   BMI 28.29 kg/m     Wt Readings from Last 3 Encounters:  01/31/23 200 lb (90.7 kg)  01/09/23 199 lb (90.3 kg)  10/23/22 200 lb 1.9 oz (90.8 kg)     GEN:  Well nourished, well developed in no acute distress HEENT: Normal NECK: No JVD; No carotid bruits LYMPHATICS: No lymphadenopathy CARDIAC: RRR, no murmurs, no rubs, no gallops RESPIRATORY:  Clear to auscultation without rales, wheezing or rhonchi  ABDOMEN: Soft, non-tender, non-distended MUSCULOSKELETAL:  No edema; No deformity  SKIN: Warm and dry LOWER EXTREMITIES: no swelling NEUROLOGIC:  Alert and oriented x 3 PSYCHIATRIC:  Normal affect   ASSESSMENT:    1. Coronary artery disease involving native coronary artery of native heart without angina pectoris   2. PVD (peripheral vascular disease) ([place])   3.  Dyslipidemia   4. Status post coronary artery bypass graft   5. Primary hypertension    PLAN:    In order of problems listed above:  Coronary disease stable from that point review like always very active have no difficulty doing activities. Peripheral vascular disease doing well from that point review. Dyslipidemia I did review his K PN which show me his LDL of 75 HDL 40.  He is on the high intense statin which I will continue. History of cardiomyopathy will repeat his echocardiogram after I will start him with Eliquis couple weeks later we will recheck it. Cardiovascular evaluation before elective right knee replacement surgery.  I think is reasonable candidate for that surgery from cardiac standpoint of view.  He did have a stress test done within the year which was negative.  Will repeat his echocardiogram.  He does not have any signs and symptoms that would indicate reactivation of the problem   Medication Adjustments/Labs and Tests Ordered: Current medicines are reviewed at length with the patient today.  Concerns regarding medicines are outlined above.  No orders of the defined types were placed in this encounter.  Medication changes: No orders of the defined types were placed in this encounter.   Signed, Park Liter, MD, [facility] 01/31/2023 [facility]

## 2023-01-31 NOTE — Telephone Encounter (Signed)
Prior Auth for [employer] initiated, determination pending: [employer]

## 2023-01-31 NOTE — Patient Instructions (Signed)

## 2023-02-04 NOTE — Telephone Encounter (Signed)
PA approved for Entresto from 01/31/23-01/31/24.

## 2023-02-05 NOTE — Telephone Encounter (Signed)
Patient notified of approval. 

## 2023-02-18 NOTE — Telephone Encounter (Signed)
Patient calling in to get update to see if he is cleared or not. Please advise

## 2023-02-18 NOTE — Telephone Encounter (Signed)
Returned patient's call about cardiac clearance. I made patient aware of dr. Marthann Schiller ov note and told him that I would fax the requesting surgeon's office the clearance recommendations. Patient verbalized understanding and thanked me for the call.

## 2023-02-19 ENCOUNTER — Other Ambulatory Visit: Payer: Self-pay | Admitting: Nurse Practitioner

## 2023-02-19 DIAGNOSIS — T148XXA Other injury of unspecified body region, initial encounter: Secondary | ICD-10-CM

## 2023-02-19 DIAGNOSIS — M545 Low back pain, unspecified: Secondary | ICD-10-CM

## 2023-03-28 ENCOUNTER — Ambulatory Visit: Payer: BC Managed Care – PPO | Attending: Cardiology

## 2023-03-28 DIAGNOSIS — R0609 Other forms of dyspnea: Secondary | ICD-10-CM

## 2023-03-28 LAB — ECHOCARDIOGRAM COMPLETE
Calc EF: 51.6 %
S' Lateral: 4 cm
Single Plane A2C EF: 55.3 %
Single Plane A4C EF: 42.7 %

## 2023-04-08 NOTE — Progress Notes (Signed)
Sent message, via epic in basket, requesting orders in epic from surgeon.  

## 2023-04-11 ENCOUNTER — Telehealth: Payer: Self-pay

## 2023-04-11 NOTE — Telephone Encounter (Signed)
-----   Message from Georgeanna Lea, MD sent at 04/03/2023  9:47 AM EDT ----- Echocardiogram showed low normal ejection fraction, mild mitral valve regurgitation.  Overall looks good

## 2023-04-11 NOTE — Telephone Encounter (Signed)
Patient notified of results.

## 2023-04-12 NOTE — Progress Notes (Signed)
Spoke with Tresa Endo at Dr. Kyra Leyland office requesting orders in epic from Careers adviser.

## 2023-04-14 NOTE — Progress Notes (Signed)
COVID Vaccine received:  []  No [x]  Yes Date of any COVID positive Test in last 90 days:  PCP -Vincent Gros, NP  Clearance 01-09-2023 Epic note Cardiologist - Gypsy Balsam, MD  Clearance 01-31-2023 Epic note  Chest x-ray -  EKG -  02-01-2023  Epic Stress Test -  ECHO - 03-28-2023  Epic Cardiac Cath -   PCR screen: [x]  Ordered & Completed           []   No Order but Needs PROFEND           []   N/A for this surgery  Surgery Plan:  [x]  Ambulatory                            []  Outpatient in bed                            []  Admit  Anesthesia:    []  General  [x]  Spinal                           []   Choice []   MAC  Pacemaker / ICD device [x]  No []  Yes   Spinal Cord Stimulator:[x]  No []  Yes       History of Sleep Apnea? [x]  No []  Yes   CPAP used?- []  No []  Yes    Does the patient monitor blood sugar?          []  No []  Yes  [x]  N/A  Patient has: []  NO Hx DM   [x]  Pre-DM                 []  DM1  []   DM2 Does patient have a [employer] or [employer]? []  No []  Yes   Fasting Blood Sugar Ranges-  Checks Blood Sugar _____ times a day  Blood Thinner / Instructions: Not on Eliquis at the present time Aspirin Instructions:  ASA 81 mg    ERAS Protocol Ordered: []  No  []  Yes PRE-SURGERY []  ENSURE  []  G2  []  No Drink Ordered  Patient is to be NPO after:   Comments: NO ORDERS as of 04-14-23  Tamika sent 1 IB msg and 1 VM to office  Activity level: Patient is able / unable to climb a flight of stairs without difficulty; []  No CP  []  No SOB, but would have ___   Patient can / can not perform ADLs without assistance.   Anesthesia review: CAD- CABG x3, CHF/CM, Mild MR on ECHO, HTN, PVD-Claudication, Some days smoker, PONV  Patient denies shortness of breath, fever, cough and chest pain at PAT appointment.  Patient verbalized understanding and agreement to the Pre-Surgical Instructions that were given to them at this PAT appointment. Patient was also educated of the need to review these  PAT instructions again prior to his surgery.I reviewed the appropriate phone numbers to call if they have any and questions or concerns.

## 2023-04-14 NOTE — Patient Instructions (Signed)
SURGICAL WAITING ROOM VISITATION Patients having surgery or a procedure may have no more than 2 support people in the waiting area - these visitors may rotate in the visitor waiting room.   Due to an increase in RSV and influenza rates and associated hospitalizations, children ages 14 and under may not visit patients in [facility] hospitals. If the patient needs to stay at the hospital during part of their recovery, the visitor guidelines for inpatient rooms apply.  PRE-OP VISITATION  Pre-op nurse will coordinate an appropriate time for 1 support person to accompany the patient in pre-op.  This support person may not rotate.  This visitor will be contacted when the time is appropriate for the visitor to come back in the pre-op area.  Please refer to the [facility] website for the visitor guidelines for Inpatients (after your surgery is over and you are in a regular room).  You are not required to quarantine at this time prior to your surgery. However, you must do this: Hand Hygiene often Do NOT share personal items Notify your provider if you are in close contact with someone who has COVID or you develop fever 100.4 or greater, new onset of sneezing, cough, sore throat, shortness of breath or body aches.  If you test positive for Covid or have been in contact with anyone that has tested positive in the last 10 days please notify you surgeon.    Your procedure is scheduled on:  Friday   Apr 26, 2023  Report to [facility] Main Entrance: [facility] entrance where the [employer] is available.   Report to admitting at: 05:15    AM  +++++Call this number if you have any questions or problems the morning of surgery [phone]  Do not eat food after Midnight the night prior to your surgery/procedure.  After Midnight you may have the following liquids until   04:15 AM  DAY OF SURGERY  Clear Liquid Diet Water Black Coffee (sugar ok, NO MILK/CREAM OR CREAMERS)  Tea (sugar ok, NO  MILK/CREAM OR CREAMERS) regular and decaf                             Plain Jell-O  with no fruit (NO RED)                                           Fruit ices (not with fruit pulp, NO RED)                                     Popsicles (NO RED)                                                                  Juice: apple, WHITE grape, WHITE cranberry Sports drinks like Gatorade or Powerade (NO RED)         The day of surgery:  Drink ONE (1) Pre-Surgery Clear Ensure at  04:15  AM the morning of surgery. Drink in one sitting. Do not sip.  This drink was  given to you during your hospital pre-op appointment visit. Nothing else to drink after completing the Pre-Surgery Clear Ensure or G2 : No candy, chewing gum or throat lozenges.                  FOLLOW ANY ADDITIONAL PRE OP INSTRUCTIONS YOU RECEIVED FROM YOUR SURGEON'S OFFICE!!!   Oral Hygiene is also important to reduce your risk of infection.        Remember - BRUSH YOUR TEETH THE MORNING OF SURGERY WITH YOUR REGULAR TOOTHPASTE  Do NOT smoke after Midnight the night before surgery.  Take ONLY these medicines the morning of surgery with A SIP OF WATER: None                    You may not have any metal on your body including  jewelry, and body piercing  Do not wear lotions, powders, cologne, or deodorant  Men may shave face and neck.  Contacts, Hearing Aids, dentures or bridgework may not be worn into surgery. DENTURES WILL BE REMOVED PRIOR TO SURGERY PLEASE DO NOT APPLY "Poly grip" OR ADHESIVES!!!  Patients discharged on the day of surgery will not be allowed to drive home.  Someone NEEDS to stay with you for the first 24 hours after anesthesia.  Do not bring your home medications to the hospital. The Pharmacy will dispense medications listed on your medication list to you during your admission in the Hospital.  Special Instructions: Bring a copy of your healthcare power of attorney and living will documents the day of  surgery, if you wish to have them scanned into your [facility]- EPIC  Please read over the following fact sheets you were given: IF YOU HAVE QUESTIONS ABOUT YOUR PRE-OP INSTRUCTIONS, PLEASE CALL [phone].      Pre-operative 5 CHG Bath Instructions   You can play a key role in reducing the risk of infection after surgery. Your skin needs to be as free of germs as possible. You can reduce the number of germs on your skin by washing with CHG (chlorhexidine gluconate) soap before surgery. CHG is an antiseptic soap that kills germs and continues to kill germs even after washing.   DO NOT use if you have an allergy to chlorhexidine/CHG or antibacterial soaps. If your skin becomes reddened or irritated, stop using the CHG and notify one of our RNs at [phone]  Please shower with the CHG soap starting 4 days before surgery using the following schedule:  START SHOWERS on [facility] 04-22-2023  Please keep in mind the following:  DO NOT shave, including legs and underarms, starting the day of your first shower.   You may shave your face at any point before/day of surgery.   Place clean sheets on your bed the day you start using CHG soap. Use a clean washcloth (not used since being washed) for each shower. DO NOT sleep with pets once you start using the CHG.   CHG Shower Instructions:  If you choose to wash your hair and private area, wash first with your normal shampoo/soap.  After you use shampoo/soap, rinse your hair and body thoroughly to remove shampoo/soap residue.  Turn the water OFF and apply about 3 tablespoons (45 ml) of CHG soap to a CLEAN washcloth.  Apply CHG soap ONLY FROM YOUR NECK DOWN TO YOUR TOES (washing for 3-5 minutes)  DO NOT use CHG soap on face, private areas, open wounds,  or sores.  Pay special attention to the area where your surgery is being performed.  If you are having back surgery, having someone wash your back for you may be helpful.  Wait 2 minutes after CHG soap is applied, then you may rinse off the CHG soap.  Pat dry with a clean towel  Put on clean clothes/pajamas   If you choose to wear lotion, please use ONLY the CHG-compatible lotions on the back of this paper.     Additional instructions for the day of surgery: DO NOT APPLY any lotions, deodorants, cologne, or perfumes.   Put on clean/comfortable clothes.  Brush your teeth.  Ask your nurse before applying any prescription medications to the skin.      CHG Compatible Lotions   Aveeno Moisturizing lotion  Cetaphil Moisturizing Cream  Cetaphil Moisturizing Lotion  Clairol Herbal Essence Moisturizing Lotion, Dry Skin  Clairol Herbal Essence Moisturizing Lotion, Extra Dry Skin  Clairol Herbal Essence Moisturizing Lotion, Normal Skin  Curel Age Defying Therapeutic Moisturizing Lotion with Alpha Hydroxy  Curel Extreme Care Body Lotion  Curel Soothing Hands Moisturizing Hand Lotion  Curel Therapeutic Moisturizing Cream, Fragrance-Free  Curel Therapeutic Moisturizing Lotion, Fragrance-Free  Curel Therapeutic Moisturizing Lotion, Original Formula  Eucerin Daily Replenishing Lotion  Eucerin Dry Skin Therapy Plus Alpha Hydroxy Crme  Eucerin Dry Skin Therapy Plus Alpha Hydroxy Lotion  Eucerin Original Crme  Eucerin Original Lotion  Eucerin Plus Crme Eucerin Plus Lotion  Eucerin TriLipid Replenishing Lotion  Keri Anti-Bacterial Hand Lotion  Keri Deep Conditioning Original Lotion Dry Skin Formula Softly Scented  Keri Deep Conditioning Original Lotion, Fragrance Free Sensitive Skin Formula  Keri Lotion Fast Absorbing Fragrance Free Sensitive Skin Formula  Keri Lotion Fast Absorbing Softly Scented Dry Skin Formula  Keri Original Lotion  Keri Skin Renewal Lotion Keri Silky Smooth Lotion   Keri Silky Smooth Sensitive Skin Lotion  Nivea Body Creamy Conditioning Oil  Nivea Body Extra Enriched Lotion  Nivea Body Original Lotion  Nivea Body Sheer Moisturizing Lotion Nivea Crme  Nivea Skin Firming Lotion  NutraDerm 30 Skin Lotion  NutraDerm Skin Lotion  NutraDerm Therapeutic Skin Cream  NutraDerm Therapeutic Skin Lotion  ProShield Protective Hand Cream  Provon moisturizing lotion  ON THE DAY OF SURGERY : Do not apply any lotions/deodorants the morning of surgery.  Please wear clean clothes to the hospital/surgery center.    FAILURE TO FOLLOW THESE INSTRUCTIONS MAY RESULT IN THE CANCELLATION OF YOUR SURGERY  PATIENT SIGNATURE_________________________________  NURSE SIGNATURE__________________________________  ________________________________________________________________________

## 2023-04-15 ENCOUNTER — Encounter (HOSPITAL_COMMUNITY)
Admission: RE | Admit: 2023-04-15 | Discharge: 2023-04-15 | Disposition: A | Discharge status: Home / Self Care | Payer: BC Managed Care – PPO | Source: Ambulatory Visit | Attending: Nurse Practitioner | Admitting: Nurse Practitioner

## 2023-04-15 ENCOUNTER — Ambulatory Visit (HOSPITAL_COMMUNITY): Payer: Self-pay | Admitting: Emergency Medicine

## 2023-04-15 DIAGNOSIS — G8929 Other chronic pain: Secondary | ICD-10-CM

## 2023-04-15 NOTE — H&P (View-Only) (Signed)
TOTAL KNEE ADMISSION H&P  Patient is being admitted for right total knee arthroplasty.  Subjective:  Chief Complaint:right knee pain.  HPI: Steven Ford, 62 y.o. male, has a history of pain and functional disability in the right knee due to trauma and arthritis and has failed non-surgical conservative treatments for greater than 12 weeks to includeNSAID's and/or analgesics, corticosteriod injections, and activity modification.  Onset of symptoms was gradual, starting >10 years ago with gradually worsening course since that time. The patient noted prior procedures on the knee to include  menisectomy and ACL reconstruction on the right knee(s).  Patient currently rates pain in the right knee(s) at 8 out of 10 with activity. Patient has night pain, worsening of pain with activity and weight bearing, and pain that interferes with activities of daily living.  Patient has evidence of periarticular osteophytes and joint space narrowing by imaging studies.  There is no active infection.  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 06/25/2022   Non-recurrent acute serous otitis media of right ear 06/25/2022   Hand dermatitis 08/24/2021   Preprocedural general physical examination 06/11/2021   Screening for prostate cancer 06/11/2021   Body mass index 28.0-28.9, adult 06/11/2021   Healthcare maintenance 06/11/2021   PVD (peripheral vascular disease) (HCC)    Migraine    Hypertension    Hyperlipidemia    High cholesterol    Coronary artery disease    Encounter to establish care 03/07/2021   Primary osteoarthritis of right knee 03/07/2021   Abnormal echocardiogram 02/12/2020   Preop cardiovascular exam 12/11/2019    Claudication in peripheral vascular disease (HCC) 07/15/2018   Coronary artery disease involving native coronary artery of native heart without angina pectoris 11/01/2015   Dyslipidemia 11/01/2015   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 Stress Myoview did not show evidence of ischemia.   Claudication in peripheral vascular disease (HCC) 07/15/2018   Peripheral arterial disease   Complication of anesthesia    Coronary artery disease    Coronary artery disease involving native coronary artery of native heart without angina pectoris 11/01/2015   Dyslipidemia 11/01/2015   Hyperlipidemia   Encounter to establish care 03/07/2021   High cholesterol    Hyperlipidemia    Hypertension    Migraine    "none in a long long while" (09/04/2018)   PONV (postoperative nausea and vomiting)    Preop cardiovascular exam 12/11/2019   Primary osteoarthritis of right knee 03/07/2021   PVD (peripheral vascular disease) (HCC)    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    Past Surgical History:  Procedure Laterality Date   ABDOMINAL AORTOGRAM W/LOWER EXTREMITY  02/18/2020   ABDOMINAL AORTOGRAM W/LOWER EXTREMITY    ABDOMINAL AORTOGRAM W/LOWER EXTREMITY Right 02/18/2020   Procedure: ABDOMINAL AORTOGRAM W/LOWER EXTREMITY;  Surgeon: Berry, Jonathan J, MD;  Location: [facility];  Service: Cardiovascular;  Laterality: Right;   ABDOMINAL AORTOGRAM W/LOWER EXTREMITY Right 05/23/2020   Procedure: ABDOMINAL AORTOGRAM W/LOWER EXTREMITY;  Surgeon: Berry, Jonathan J, MD;    Location: [facility];  Service: Cardiovascular;  Laterality: Right;   ACHILLES TENDON REPAIR Right 2010   CORONARY ARTERY BYPASS GRAFT  2002   "triple"   KNEE  ARTHROSCOPY Right 2019   LOWER EXTREMITY ANGIOGRAPHY N/A 08/28/2018   Procedure: LOWER EXTREMITY ANGIOGRAPHY;  Surgeon: Berry, Jonathan J, MD;  Location: [facility];  Service: Cardiovascular;  Laterality: N/A;   LOWER EXTREMITY ANGIOGRAPHY N/A 09/04/2018   Procedure: LOWER EXTREMITY ANGIOGRAPHY;  Surgeon: Berry, Jonathan J, MD;  Location: [facility];  Service: Cardiovascular;  Laterality: N/A;   PERIPHERAL VASCULAR ATHERECTOMY  02/18/2020   Procedure: PERIPHERAL VASCULAR ATHERECTOMY;  Surgeon: Berry, Jonathan J, MD;  Location: [facility];  Service: Cardiovascular;;  rt SFA   PERIPHERAL VASCULAR ATHERECTOMY  05/23/2020   Procedure: PERIPHERAL VASCULAR ATHERECTOMY;  Surgeon: Berry, Jonathan J, MD;  Location: [facility];  Service: Cardiovascular;;  Laser-Left SFA   PERIPHERAL VASCULAR BALLOON ANGIOPLASTY  05/23/2020   Procedure: PERIPHERAL VASCULAR BALLOON ANGIOPLASTY;  Surgeon: Berry, Jonathan J, MD;  Location: [facility];  Service: Cardiovascular;;  Left SFA   PERIPHERAL VASCULAR INTERVENTION Left 09/04/2018   PERIPHERAL VASCULAR INTERVENTION Left 09/04/2018   Procedure: PERIPHERAL VASCULAR INTERVENTION;  Surgeon: Berry, Jonathan J, MD;  Location: [facility];  Service: Cardiovascular;  Laterality: Left;   PERIPHERAL VASCULAR INTERVENTION  02/18/2020   Procedure: PERIPHERAL VASCULAR INTERVENTION;  Surgeon: Berry, Jonathan J, MD;  Location: [facility];  Service: Cardiovascular;;  rt SFA   TOTAL HIP ARTHROPLASTY Left 04/25/2022   Procedure: TOTAL HIP ARTHROPLASTY;  Surgeon: Marchwiany, Daniel A, MD;  Location: [facility];  Service: Orthopedics;  Laterality: Left;    Current Outpatient Medications  Medication Sig Dispense Refill Last Dose   aspirin EC 81 MG tablet Take 81 mg by mouth daily. Swallow whole.      atorvastatin (LIPITOR) 80 MG tablet Take 1 tablet (80 mg total) by mouth daily. 90 tablet 1    ezetimibe (ZETIA) 10 MG tablet Take 1 tablet  (10 mg total) by mouth daily. 90 tablet 3    Multiple Vitamins-Minerals (MULTIVITAMIN WITH MINERALS) tablet Take 1 tablet by mouth in the morning.      sacubitril-valsartan (ENTRESTO) 24-26 MG Take 1 tablet by mouth 2 (two) times daily. (Patient taking differently: Take 1 tablet by mouth daily.) 180 tablet 3    No current facility-administered medications for this visit.   Allergies  Allergen Reactions   Influenza Vaccines Other (See Comments)    Pt gets sick or sicker with flu shot     Social History   Tobacco Use   Smoking status: Some Days    Types: Cigars   Smokeless tobacco: Never   Tobacco comments:    09/04/2018 "q time I play golf"  Substance Use Topics   Alcohol use: Yes    Alcohol/week: 2.0 standard drinks of alcohol    Types: 2 Cans of beer per week    Comment: occas.    Family History  Problem Relation Age of Onset   Heart disease Mother    High blood pressure Mother    Anemia Father      Review of Systems  Musculoskeletal:  Positive for arthralgias.  All other systems reviewed and are negative.   Objective:  Physical Exam Constitutional:      General: He is not in acute distress.    Appearance: Normal appearance. He is normal weight.  HENT:       Head: Normocephalic and atraumatic.  Eyes:     Extraocular Movements: Extraocular movements intact.     Conjunctiva/sclera: Conjunctivae normal.     Pupils: Pupils are equal, round, and reactive to light.  Cardiovascular:     Rate and Rhythm: Normal rate and regular rhythm.     Pulses: Normal pulses.     Heart sounds: Normal heart sounds.  Pulmonary:     Effort: Pulmonary effort is normal. No respiratory distress.     Breath sounds: Normal breath sounds.  Abdominal:     General: Bowel sounds are normal. There is no distension.     Palpations: Abdomen is soft.     Tenderness: There is no abdominal tenderness.  Musculoskeletal:        General: Tenderness present.     Cervical back: Normal range of motion  and neck supple.     Comments: Right knee TTP, most over medial joint line. ROM 5-115.  Painful ROM in all directions due to pain.  No lesion overlying area of chief complaint.  BLE appear grossly neurovascularly intact. Gait mildly antalgic without use of assistive device.  No erythema or edema of knee joint appreciated.  No calf pain, erythema, or warmth.  Lymphadenopathy:     Cervical: No cervical adenopathy.  Skin:    General: Skin is warm and dry.     Capillary Refill: Capillary refill takes less than 2 seconds.     Findings: No erythema or rash.  Neurological:     General: No focal deficit present.     Mental Status: He is alert and oriented to person, place, and time.  Psychiatric:        Mood and Affect: Mood normal.        Behavior: Behavior normal.     Vital signs in last 24 hours: @VSRANGES@  Labs:   Estimated body mass index is 28.29 kg/m as calculated from the following:   Height as of 01/31/23: 5' 10.5" (1.791 m).   Weight as of 01/31/23: 90.7 kg.   Imaging Review Plain radiographs demonstrate severe degenerative joint disease of the right knee(s). The overall alignment ismild varus. The bone quality appears to be good for age and reported activity level.    Assessment/Plan:  End stage arthritis, right knee   The patient history, physical examination, clinical judgment of the provider and imaging studies are consistent with end stage degenerative joint disease of the right knee(s) and total knee arthroplasty is deemed medically necessary. The treatment options including medical management, injection therapy arthroscopy and arthroplasty were discussed at length. The risks and benefits of total knee arthroplasty were presented and reviewed. The risks due to aseptic loosening, infection, stiffness, patella tracking problems, thromboembolic complications and other imponderables were discussed. The patient acknowledged the explanation, agreed to proceed with the plan and  consent was signed. Patient is being admitted for inpatient treatment for surgery, pain control, PT, OT, prophylactic antibiotics, VTE prophylaxis, progressive ambulation and ADL's and discharge planning. The patient is planning to be discharged home with outpatient PT.   

## 2023-04-15 NOTE — H&P (Signed)
TOTAL KNEE ADMISSION H&P  Patient is being admitted for right total knee arthroplasty.  Subjective:  Chief Complaint:right knee pain.  HPI: Steven Ford, 62 y.o. male, has a history of pain and functional disability in the right knee due to trauma and arthritis and has failed non-surgical conservative treatments for greater than 12 weeks to includeNSAID's and/or analgesics, corticosteriod injections, and activity modification.  Onset of symptoms was gradual, starting >10 years ago with gradually worsening course since that time. The patient noted prior procedures on the knee to include  menisectomy and ACL reconstruction on the right knee(s).  Patient currently rates pain in the right knee(s) at 8 out of 10 with activity. Patient has night pain, worsening of pain with activity and weight bearing, and pain that interferes with activities of daily living.  Patient has evidence of periarticular osteophytes and joint space narrowing by imaging studies.  There is no active infection.  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 06/25/2022   Non-recurrent acute serous otitis media of right ear 06/25/2022   Hand dermatitis 08/24/2021   Preprocedural general physical examination 06/11/2021   Screening for prostate cancer 06/11/2021   Body mass index 28.0-28.9, adult 06/11/2021   Healthcare maintenance 06/11/2021   PVD (peripheral vascular disease) (HCC)    Migraine    Hypertension    Hyperlipidemia    High cholesterol    Coronary artery disease    Encounter to establish care 03/07/2021   Primary osteoarthritis of right knee 03/07/2021   Abnormal echocardiogram 02/12/2020   Preop cardiovascular exam 12/11/2019    Claudication in peripheral vascular disease (HCC) 07/15/2018   Coronary artery disease involving native coronary artery of native heart without angina pectoris 11/01/2015   Dyslipidemia 11/01/2015   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 Stress Myoview did not show evidence of ischemia.   Claudication in peripheral vascular disease (HCC) 07/15/2018   Peripheral arterial disease   Complication of anesthesia    Coronary artery disease    Coronary artery disease involving native coronary artery of native heart without angina pectoris 11/01/2015   Dyslipidemia 11/01/2015   Hyperlipidemia   Encounter to establish care 03/07/2021   High cholesterol    Hyperlipidemia    Hypertension    Migraine    "none in a long long while" (09/04/2018)   PONV (postoperative nausea and vomiting)    Preop cardiovascular exam 12/11/2019   Primary osteoarthritis of right knee 03/07/2021   PVD (peripheral vascular disease) (HCC)    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    Past Surgical History:  Procedure Laterality Date   ABDOMINAL AORTOGRAM W/LOWER EXTREMITY  02/18/2020   ABDOMINAL AORTOGRAM W/LOWER EXTREMITY    ABDOMINAL AORTOGRAM W/LOWER EXTREMITY Right 02/18/2020   Procedure: ABDOMINAL AORTOGRAM W/LOWER EXTREMITY;  Surgeon: Runell Gess, MD;  Location: [facility];  Service: Cardiovascular;  Laterality: Right;   ABDOMINAL AORTOGRAM W/LOWER EXTREMITY Right 05/23/2020   Procedure: ABDOMINAL AORTOGRAM W/LOWER EXTREMITY;  Surgeon: Runell Gess, MD;  Location: [facility];  Service: Cardiovascular;  Laterality: Right;   ACHILLES TENDON REPAIR Right 2010   CORONARY ARTERY BYPASS GRAFT  2002   "triple"   KNEE  ARTHROSCOPY Right 2019   LOWER EXTREMITY ANGIOGRAPHY N/A 08/28/2018   Procedure: LOWER EXTREMITY ANGIOGRAPHY;  Surgeon: Runell Gess, MD;  Location: [facility];  Service: Cardiovascular;  Laterality: N/A;   LOWER EXTREMITY ANGIOGRAPHY N/A 09/04/2018   Procedure: LOWER EXTREMITY ANGIOGRAPHY;  Surgeon: Runell Gess, MD;  Location: [facility];  Service: Cardiovascular;  Laterality: N/A;   PERIPHERAL VASCULAR ATHERECTOMY  02/18/2020   Procedure: PERIPHERAL VASCULAR ATHERECTOMY;  Surgeon: Runell Gess, MD;  Location: [facility] CV [facility];  Service: Cardiovascular;;  rt SFA   PERIPHERAL VASCULAR ATHERECTOMY  05/23/2020   Procedure: PERIPHERAL VASCULAR ATHERECTOMY;  Surgeon: Runell Gess, MD;  Location: [facility] IN[facility] CV [facility];  Service: Cardiovascular;;  Laser-Left SFA   PERIPHERAL VASCULAR BALLOON ANGIOPLASTY  05/23/2020   Procedure: PERIPHERAL VASCULAR BALLOON ANGIOPLASTY;  Surgeon: Runell Gess, MD;  [facility]: [facility];  Service: Cardiovascular;;  Left SFA   PERIPHERAL VASCULAR INTERVENTION Left 09/04/2018   PERIPHERAL VASCULAR INTERVENTION Left 09/04/2018   Procedure: PERIPHERAL VASCULAR INTERVENTION;  Surgeon: Runell Gess, MD;  Location: [facility];  Service: Cardiovascular;  Laterality: Left;   PERIPHERAL VASCULAR INTERVENTION  02/18/2020   Procedure: PERIPHERAL VASCULAR INTERVENTION;  Surgeon: Runell Gess, MD;  Location: [facility];  Service: Cardiovascular;;  rt SFA   TOTAL HIP ARTHROPLASTY Left 04/25/2022   Procedure: TOTAL HIP ARTHROPLASTY;  Surgeon: Joen Laura, MD;  Location: [facility];  Service: Orthopedics;  Laterality: Left;    Current Outpatient Medications  Medication Sig Dispense Refill Last Dose   aspirin EC 81 MG tablet Take 81 mg by mouth daily. Swallow whole.      atorvastatin (LIPITOR) 80 MG tablet Take 1 tablet (80 mg total) by mouth daily. 90 tablet 1    ezetimibe (ZETIA) 10 MG tablet Take 1 tablet  (10 mg total) by mouth daily. 90 tablet 3    Multiple Vitamins-Minerals (MULTIVITAMIN WITH MINERALS) tablet Take 1 tablet by mouth in the morning.      sacubitril-valsartan (ENTRESTO) 24-26 MG Take 1 tablet by mouth 2 (two) times daily. (Patient taking differently: Take 1 tablet by mouth daily.) 180 tablet 3    No current facility-administered medications for this visit.   Allergies  Allergen Reactions   Influenza Vaccines Other (See Comments)    Pt gets sick or sicker with flu shot     Social History   Tobacco Use   Smoking status: Some Days    Types: Cigars   Smokeless tobacco: Never   Tobacco comments:    09/04/2018 "q time I play golf"  Substance Use Topics   Alcohol use: Yes    Alcohol/week: 2.0 standard drinks of alcohol    Types: 2 Cans of beer per week    Comment: occas.    Family History  Problem Relation Age of Onset   Heart disease Mother    High blood pressure Mother    Anemia Father      Review of Systems  Musculoskeletal:  Positive for arthralgias.  All other systems reviewed and are negative.   Objective:  Physical Exam Constitutional:      General: He is not in acute distress.    Appearance: Normal appearance. He is normal weight.  HENT:  Head: Normocephalic and atraumatic.  Eyes:     Extraocular Movements: Extraocular movements intact.     Conjunctiva/sclera: Conjunctivae normal.     Pupils: Pupils are equal, round, and reactive to light.  Cardiovascular:     Rate and Rhythm: Normal rate and regular rhythm.     Pulses: Normal pulses.     Heart sounds: Normal heart sounds.  Pulmonary:     Effort: Pulmonary effort is normal. No respiratory distress.     Breath sounds: Normal breath sounds.  Abdominal:     General: Bowel sounds are normal. There is no distension.     Palpations: Abdomen is soft.     Tenderness: There is no abdominal tenderness.  Musculoskeletal:        General: Tenderness present.     Cervical back: Normal range of motion  and neck supple.     Comments: Right knee TTP, most over medial joint line. ROM 5-115.  Painful ROM in all directions due to pain.  No lesion overlying area of chief complaint.  BLE appear grossly neurovascularly intact. Gait mildly antalgic without use of assistive device.  No erythema or edema of knee joint appreciated.  No calf pain, erythema, or warmth.  Lymphadenopathy:     Cervical: No cervical adenopathy.  Skin:    General: Skin is warm and dry.     Capillary Refill: Capillary refill takes less than 2 seconds.     Findings: No erythema or rash.  Neurological:     General: No focal deficit present.     Mental Status: He is alert and oriented to person, place, and time.  Psychiatric:        Mood and Affect: Mood normal.        Behavior: Behavior normal.     Vital signs in last 24 hours: @VSRANGES @  Labs:   Estimated body mass index is 28.29 kg/m as calculated from the following:   Height as of 01/31/23: 5' 10.5" (1.791 m).   Weight as of 01/31/23: 90.7 kg.   Imaging Review Plain radiographs demonstrate severe degenerative joint disease of the right knee(s). The overall alignment ismild varus. The bone quality appears to be good for age and reported activity level.    Assessment/Plan:  End stage arthritis, right knee   The patient history, physical examination, clinical judgment of the provider and imaging studies are consistent with end stage degenerative joint disease of the right knee(s) and total knee arthroplasty is deemed medically necessary. The treatment options including medical management, injection therapy arthroscopy and arthroplasty were discussed at length. The risks and benefits of total knee arthroplasty were presented and reviewed. The risks due to aseptic loosening, infection, stiffness, patella tracking problems, thromboembolic complications and other imponderables were discussed. The patient acknowledged the explanation, agreed to proceed with the plan and  consent was signed. Patient is being admitted for inpatient treatment for surgery, pain control, PT, OT, prophylactic antibiotics, VTE prophylaxis, progressive ambulation and ADL's and discharge planning. The patient is planning to be discharged home with outpatient PT.

## 2023-04-16 ENCOUNTER — Encounter (HOSPITAL_COMMUNITY): Payer: Self-pay

## 2023-04-16 ENCOUNTER — Other Ambulatory Visit: Payer: Self-pay

## 2023-04-16 ENCOUNTER — Encounter (HOSPITAL_COMMUNITY)
Admission: RE | Admit: 2023-04-16 | Discharge: 2023-04-16 | Disposition: A | Discharge status: Home / Self Care | Payer: BC Managed Care – PPO | Source: Ambulatory Visit | Attending: Orthopedic Surgery | Admitting: Orthopedic Surgery

## 2023-04-16 VITALS — BP 118/74 | HR 66 | Temp 98.6°F | Resp 14 | Ht 70.0 in | Wt 202.0 lb

## 2023-04-16 DIAGNOSIS — Z01812 Encounter for preprocedural laboratory examination: Secondary | ICD-10-CM | POA: Diagnosis present

## 2023-04-16 DIAGNOSIS — F17210 Nicotine dependence, cigarettes, uncomplicated: Secondary | ICD-10-CM | POA: Diagnosis not present

## 2023-04-16 DIAGNOSIS — I2510 Atherosclerotic heart disease of native coronary artery without angina pectoris: Secondary | ICD-10-CM | POA: Diagnosis not present

## 2023-04-16 DIAGNOSIS — M1711 Unilateral primary osteoarthritis, right knee: Secondary | ICD-10-CM | POA: Insufficient documentation

## 2023-04-16 DIAGNOSIS — I110 Hypertensive heart disease with heart failure: Secondary | ICD-10-CM | POA: Diagnosis not present

## 2023-04-16 DIAGNOSIS — G8929 Other chronic pain: Secondary | ICD-10-CM

## 2023-04-16 DIAGNOSIS — I739 Peripheral vascular disease, unspecified: Secondary | ICD-10-CM | POA: Diagnosis not present

## 2023-04-16 DIAGNOSIS — M25561 Pain in right knee: Secondary | ICD-10-CM | POA: Insufficient documentation

## 2023-04-16 DIAGNOSIS — Z951 Presence of aortocoronary bypass graft: Secondary | ICD-10-CM | POA: Insufficient documentation

## 2023-04-16 DIAGNOSIS — Z01818 Encounter for other preprocedural examination: Secondary | ICD-10-CM

## 2023-04-16 DIAGNOSIS — I10 Essential (primary) hypertension: Secondary | ICD-10-CM

## 2023-04-16 LAB — CBC WITH DIFFERENTIAL/PLATELET
Abs Immature Granulocytes: 0.01 10*3/uL (ref 0.00–0.07)
Basophils Absolute: 0.1 10*3/uL (ref 0.0–0.1)
Basophils Relative: 1 %
Eosinophils Absolute: 0.3 10*3/uL (ref 0.0–0.5)
Eosinophils Relative: 6 %
HCT: 43.8 % (ref 39.0–52.0)
Hemoglobin: 14 g/dL (ref 13.0–17.0)
Immature Granulocytes: 0 %
Lymphocytes Relative: 33 %
Lymphs Abs: 1.6 10*3/uL (ref 0.7–4.0)
MCH: 29.9 pg (ref 26.0–34.0)
MCHC: 32 g/dL (ref 30.0–36.0)
MCV: 93.4 fL (ref 80.0–100.0)
Monocytes Absolute: 0.5 10*3/uL (ref 0.1–1.0)
Monocytes Relative: 10 %
Neutro Abs: 2.5 10*3/uL (ref 1.7–7.7)
Neutrophils Relative %: 50 %
Platelets: 261 10*3/uL (ref 150–400)
RBC: 4.69 MIL/uL (ref 4.22–5.81)
RDW: 14 % (ref 11.5–15.5)
WBC: 5 10*3/uL (ref 4.0–10.5)
nRBC: 0 % (ref 0.0–0.2)

## 2023-04-16 LAB — COMPREHENSIVE METABOLIC PANEL
ALT: 37 U/L (ref 0–44)
AST: 32 U/L (ref 15–41)
Albumin: 4 g/dL (ref 3.5–5.0)
Alkaline Phosphatase: 99 U/L (ref 38–126)
Anion gap: 8 (ref 5–15)
BUN: 14 mg/dL (ref 8–23)
CO2: 23 mmol/L (ref 22–32)
Calcium: 8.5 mg/dL — ABNORMAL LOW (ref 8.9–10.3)
Chloride: 108 mmol/L (ref 98–111)
Creatinine, Ser: 0.81 mg/dL (ref 0.61–1.24)
GFR, Estimated: >60 mL/min (ref >60)
Glucose, Bld: 99 mg/dL (ref 70–99)
Potassium: 3.9 mmol/L (ref 3.5–5.1)
Sodium: 139 mmol/L (ref 135–145)
Total Bilirubin: 0.6 mg/dL (ref 0.3–1.2)
Total Protein: 7 g/dL (ref 6.5–8.1)

## 2023-04-16 LAB — SURGICAL PCR SCREEN
MRSA, PCR: NEGATIVE
Staphylococcus aureus: NEGATIVE

## 2023-04-16 LAB — TYPE AND SCREEN: ABO/RH(D): O POS

## 2023-04-18 NOTE — Anesthesia Preprocedure Evaluation (Addendum)
Anesthesia Evaluation  Patient identified by MRN, date of birth, ID band Patient awake    Reviewed: Allergy & Precautions, H&P , NPO status , Patient's Chart, lab work & pertinent test results  History of Anesthesia Complications (+) PONV and history of anesthetic complications  Airway Mallampati: II  TM Distance: >3 FB Neck ROM: Full    Dental no notable dental hx.    Pulmonary neg pulmonary ROS, Current Smoker and Patient abstained from smoking.   Pulmonary exam normal breath sounds clear to auscultation       Cardiovascular hypertension, + CAD, + CABG and + Peripheral Vascular Disease  Normal cardiovascular exam Rhythm:Regular Rate:Normal   1. Left ventricular ejection fraction, by estimation, is 50 to 55%. Left  ventricular ejection fraction by 2D MOD biplane is 51.6 %. The left  ventricle has low normal function. The left ventricle has no regional wall  motion abnormalities. There is mild  concentric left ventricular hypertrophy. Left ventricular diastolic  parameters are consistent with Grade I diastolic dysfunction (impaired  relaxation).   2. TAPSE 1.5 cm. Right ventricular systolic function is mildly reduced.  The right ventricular size is normal. Tricuspid regurgitation signal is  inadequate for assessing PA pressure.   3. Left atrial size was mildly dilated.   4. The mitral valve is normal in structure. Mild mitral valve  regurgitation. No evidence of mitral stenosis.   5. The aortic valve is tricuspid. Aortic valve regurgitation is not  visualized. No aortic stenosis is present.   6. The inferior vena cava is normal in size with greater than 50%  respiratory variability, suggesting right atrial pressure of 3 mmHg.     Neuro/Psych negative neurological ROS  negative psych ROS   GI/Hepatic negative GI ROS, Neg liver ROS,,,  Endo/Other  negative endocrine ROS    Renal/GU negative Renal ROS  negative  genitourinary   Musculoskeletal negative musculoskeletal ROS (+)    Abdominal   Peds negative pediatric ROS (+)  Hematology negative hematology ROS (+)   Anesthesia Other Findings   Reproductive/Obstetrics negative OB ROS                             Anesthesia Physical Anesthesia Plan  ASA: 3  Anesthesia Plan: Spinal   Post-op Pain Management: Regional block*   Induction: Intravenous  PONV Risk Score and Plan: 2 and Ondansetron, Dexamethasone, Propofol infusion and Treatment may vary due to age or medical condition  Airway Management Planned: Simple Face Mask  Additional Equipment:   Intra-op Plan:   Post-operative Plan:   Informed Consent: I have reviewed the patients History and Physical, chart, labs and discussed the procedure including the risks, benefits and alternatives for the proposed anesthesia with the patient or authorized representative who has indicated his/her understanding and acceptance.     Dental advisory given  Plan Discussed with: CRNA and Surgeon  Anesthesia Plan Comments: (See PAT note 04/16/23)       Anesthesia Quick Evaluation

## 2023-04-18 NOTE — Progress Notes (Signed)
Anesthesia Chart Review   Case: [id number] Date/Time: 04/26/23 0700   Procedure: TOTAL KNEE ARTHROPLASTY (Right: Knee)   Anesthesia type: Spinal   Pre-op diagnosis: OA RIGHT KNEE   Location: [facility] 08 / [facility]   Surgeons: Joen Laura, MD       DISCUSSION:62 y.o. smoker with h/o PONV, HTN, CAD (CABG), CHF, PVD, right knee OA scheduled for above procedure 04/26/2023 with Dr. Weber Cooks.   Pt seen by cardiology 01/31/2023. Per OV note, "Cardiovascular evaluation before elective right knee replacement surgery. I think is reasonable candidate for that surgery from cardiac standpoint of view. He did have a stress test done within the year which was negative. Will repeat his echocardiogram. He does not have any signs and symptoms that would indicate reactivation of the problem"  Echo 03/28/23 with EF 50-55%, no valvular problems.   Anticipate pt can proceed with planned procedure barring acute status change.   VS: BP 118/74 Comment: right arm sitting  Pulse 66   Temp 37 C (Oral)   Resp 14   Ht 5\' 10"  (1.778 m)   Wt 91.6 kg   SpO2 95%   BMI 28.98 kg/m   PROVIDERS: Carlean Jews, NP is PCP   Cardiologist - Gypsy Balsam, MD  LABS: Labs reviewed: Acceptable for surgery. (all labs ordered are listed, but only abnormal results are displayed)  Labs Reviewed  COMPREHENSIVE METABOLIC PANEL - Abnormal; Notable for the following components:      Result Value   Calcium 8.5 (*)    All other components within normal limits  SURGICAL PCR SCREEN  CBC WITH DIFFERENTIAL/PLATELET  TYPE AND SCREEN     IMAGES:   EKG:   CV: Echo 03/28/23 1. Left ventricular ejection fraction, by estimation, is 50 to 55%. Left  ventricular ejection fraction by 2D MOD biplane is 51.6 %. The left  ventricle has low normal function. The left ventricle has no regional wall  motion abnormalities. There is mild  concentric left ventricular hypertrophy. Left ventricular diastolic  parameters  are consistent with Grade I diastolic dysfunction (impaired  relaxation).   2. TAPSE 1.5 cm. Right ventricular systolic function is mildly reduced.  The right ventricular size is normal. Tricuspid regurgitation signal is  inadequate for assessing PA pressure.   3. Left atrial size was mildly dilated.   4. The mitral valve is normal in structure. Mild mitral valve  regurgitation. No evidence of mitral stenosis.   5. The aortic valve is tricuspid. Aortic valve regurgitation is not  visualized. No aortic stenosis is present.   6. The inferior vena cava is normal in size with greater than 50%  respiratory variability, suggesting right atrial pressure of 3 mmHg.   Myocardial Perfusion 02/08/2022   Findings are consistent with no ischemia and no prior myocardial infarction. The study is intermediate risk.   No ST deviation was noted.   Left ventricular function is abnormal. Global function is moderately reduced. Nuclear stress EF: 39 %. The left ventricular ejection fraction is moderately decreased (30-44%). End diastolic cavity size is normal.   Prior study available for comparison from 02/12/2020. Past Medical History:  Diagnosis Date   Cardiomyopathy (HCC) 02/12/2020   Echo 12/21/2019 showed decreased EF of 40%, grade 2DD, with global hypokinesia. Stress Myoview did not show evidence of ischemia.   Claudication in peripheral vascular disease (HCC) 07/15/2018   Peripheral arterial disease   Complication of anesthesia    Coronary artery disease    Coronary artery  disease involving native coronary artery of native heart without angina pectoris 11/01/2015   Dyslipidemia 11/01/2015   Hyperlipidemia   Encounter to establish care 03/07/2021   High cholesterol    Hyperlipidemia    Hypertension    Migraine    "none in a long long while" (09/04/2018)   PONV (postoperative nausea and vomiting)    Preop cardiovascular exam 12/11/2019   Primary osteoarthritis of right knee 03/07/2021   PVD  (peripheral vascular disease) (HCC)    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    Past Surgical History:  Procedure Laterality Date   ABDOMINAL AORTOGRAM W/LOWER EXTREMITY  02/18/2020   ABDOMINAL AORTOGRAM W/LOWER EXTREMITY    ABDOMINAL AORTOGRAM W/LOWER EXTREMITY Right 02/18/2020   Procedure: ABDOMINAL AORTOGRAM W/LOWER EXTREMITY;  Surgeon: Runell Gess, MD;  Location: [facility];  Service: Cardiovascular;  Laterality: Right;   ABDOMINAL AORTOGRAM W/LOWER EXTREMITY Right 05/23/2020   Procedure: ABDOMINAL AORTOGRAM W/LOWER EXTREMITY;  Surgeon: Runell Gess, MD;  Location: [facility];  Service: Cardiovascular;  Laterality: Right;   ACHILLES TENDON REPAIR Right 2010   CORONARY ARTERY BYPASS GRAFT  2002   "triple"   KNEE ARTHROSCOPY Right 2019   LOWER EXTREMITY ANGIOGRAPHY N/A 08/28/2018   Procedure: LOWER EXTREMITY ANGIOGRAPHY;  Surgeon: Runell Gess, MD;  Location: [facility];  Service: Cardiovascular;  Laterality: N/A;   LOWER EXTREMITY ANGIOGRAPHY N/A 09/04/2018   Procedure: LOWER EXTREMITY ANGIOGRAPHY;  Surgeon: Runell Gess, MD;  Location: [facility];  Service: Cardiovascular;  Laterality: N/A;   PERIPHERAL VASCULAR ATHERECTOMY  02/18/2020   Procedure: PERIPHERAL VASCULAR ATHERECTOMY;  Surgeon: Runell Gess, MD;  Location: [facility] INVAS[facility] CV LAB;  Service: Cardiovascular;;  rt SFA   PERIPHERAL VASCULAR ATHERECTOMY  05/23/2020   Procedure: PERIPHERAL VASCULAR ATHERECTOMY;  Surgeon: Runell Gess, MD;  Location: [facility] INVASIVE CV LAB;  Service: Cardiovascular;;  Laser-Left SFA   PERIPHERAL VASCULAR BALLOON ANGIOPLASTY  05/23/2020   Procedure: PERIPHERAL VASCULAR BALLOON ANGIOPLASTY;  Surgeon: Runell Gess, MD;  [facility]: [facility];  Service: Cardiovascular;;  Left SFA   PERIPHERAL VASCULAR INTERVENTION Left 09/04/2018   PERIPHERAL VASCULAR INTERVENTION Left  09/04/2018   Procedure: PERIPHERAL VASCULAR INTERVENTION;  Surgeon: Runell Gess, MD;  [facility]: [facility];  Service: Cardiovascular;  Laterality: Left;   PERIPHERAL VASCULAR INTERVENTION  02/18/2020   Procedure: PERIPHERAL VASCULAR INTERVENTION;  Surgeon: Runell Gess, MD;  [facility]: [facility];  Service: Cardiovascular;;  rt SFA   TOTAL HIP ARTHROPLASTY Left 04/25/2022   Procedure: TOTAL HIP ARTHROPLASTY;  Surgeon: Joen Laura, MD;  Location: [facility];  Service: Orthopedics;  Laterality: Left;    MEDICATIONS:  aspirin EC 81 MG tablet   atorvastatin (LIPITOR) 80 MG tablet   ezetimibe (ZETIA) 10 MG tablet   Multiple Vitamins-Minerals (MULTIVITAMIN WITH MINERALS) tablet   sacubitril-valsartan (ENTRESTO) 24-26 MG   No current facility-administered medications for this encounter.    Jodell Cipro Ward, PA-C [facility] [phone]

## 2023-04-26 ENCOUNTER — Ambulatory Visit (HOSPITAL_COMMUNITY)
Admission: RE | Admit: 2023-04-26 | Discharge: 2023-04-26 | Disposition: A | Discharge status: Home / Self Care | Payer: BC Managed Care – PPO | Attending: Orthopedic Surgery | Admitting: Orthopedic Surgery

## 2023-04-26 ENCOUNTER — Encounter (HOSPITAL_COMMUNITY): Payer: Self-pay | Admitting: Orthopedic Surgery

## 2023-04-26 ENCOUNTER — Ambulatory Visit (HOSPITAL_COMMUNITY): Payer: BC Managed Care – PPO | Admitting: Physician Assistant

## 2023-04-26 ENCOUNTER — Ambulatory Visit (HOSPITAL_COMMUNITY): Payer: BC Managed Care – PPO

## 2023-04-26 ENCOUNTER — Encounter (HOSPITAL_COMMUNITY)
Admission: RE | Disposition: A | Discharge status: Home / Self Care | Payer: Self-pay | Source: Home / Self Care | Attending: Orthopedic Surgery

## 2023-04-26 ENCOUNTER — Ambulatory Visit (HOSPITAL_COMMUNITY): Payer: BC Managed Care – PPO | Admitting: Anesthesiology

## 2023-04-26 ENCOUNTER — Other Ambulatory Visit: Payer: Self-pay

## 2023-04-26 DIAGNOSIS — F17290 Nicotine dependence, other tobacco product, uncomplicated: Secondary | ICD-10-CM | POA: Diagnosis not present

## 2023-04-26 DIAGNOSIS — G43909 Migraine, unspecified, not intractable, without status migrainosus: Secondary | ICD-10-CM | POA: Insufficient documentation

## 2023-04-26 DIAGNOSIS — I2510 Atherosclerotic heart disease of native coronary artery without angina pectoris: Secondary | ICD-10-CM | POA: Insufficient documentation

## 2023-04-26 DIAGNOSIS — Z951 Presence of aortocoronary bypass graft: Secondary | ICD-10-CM | POA: Diagnosis not present

## 2023-04-26 DIAGNOSIS — M1711 Unilateral primary osteoarthritis, right knee: Secondary | ICD-10-CM | POA: Diagnosis present

## 2023-04-26 DIAGNOSIS — E7800 Pure hypercholesterolemia, unspecified: Secondary | ICD-10-CM | POA: Diagnosis not present

## 2023-04-26 DIAGNOSIS — I517 Cardiomegaly: Secondary | ICD-10-CM | POA: Insufficient documentation

## 2023-04-26 DIAGNOSIS — I340 Nonrheumatic mitral (valve) insufficiency: Secondary | ICD-10-CM | POA: Insufficient documentation

## 2023-04-26 DIAGNOSIS — I739 Peripheral vascular disease, unspecified: Secondary | ICD-10-CM | POA: Diagnosis not present

## 2023-04-26 DIAGNOSIS — I10 Essential (primary) hypertension: Secondary | ICD-10-CM | POA: Diagnosis not present

## 2023-04-26 DIAGNOSIS — H9011 Conductive hearing loss, unilateral, right ear, with unrestricted hearing on the contralateral side: Secondary | ICD-10-CM | POA: Diagnosis not present

## 2023-04-26 HISTORY — PX: TOTAL KNEE ARTHROPLASTY: SHX125

## 2023-04-26 LAB — TYPE AND SCREEN: Antibody Screen: NEGATIVE

## 2023-04-26 LAB — ABO/RH: ABO/RH(D): O POS

## 2023-04-26 SURGERY — ARTHROPLASTY, KNEE, TOTAL
Anesthesia: Spinal | Site: Knee | Laterality: Right

## 2023-04-26 MED ORDER — KETOROLAC TROMETHAMINE 15 MG/ML IJ SOLN: 15.0000 mg | Freq: Four times a day (QID) | INTRAMUSCULAR | Status: DC

## 2023-04-26 MED ORDER — APIXABAN 2.5 MG PO TABS: 2.5000 mg | ORAL_TABLET | Freq: Two times a day (BID) | ORAL | 0 refills | Status: DC

## 2023-04-26 MED ORDER — ROPIVACAINE HCL 7.5 MG/ML IJ SOLN
INTRAMUSCULAR | Status: DC | PRN
  Administered 2023-04-26: 20 mL via PERINEURAL

## 2023-04-26 MED ORDER — ONDANSETRON HCL 4 MG/2ML IJ SOLN
INTRAMUSCULAR | Status: AC
  Filled 2023-04-26: qty 2

## 2023-04-26 MED ORDER — LACTATED RINGERS IV BOLUS
250.0000 mL | Freq: Once | INTRAVENOUS | Status: AC
  Administered 2023-04-26: 250 mL via INTRAVENOUS

## 2023-04-26 MED ORDER — OXYCODONE HCL 5 MG PO TABS: 5.0000 mg | ORAL_TABLET | Freq: Once | ORAL | Status: DC | PRN

## 2023-04-26 MED ORDER — BUPIVACAINE LIPOSOME 1.3 % IJ SUSP: 20.0000 mL | Freq: Once | INTRAMUSCULAR | Status: DC

## 2023-04-26 MED ORDER — PROPOFOL 1000 MG/100ML IV EMUL
INTRAVENOUS | Status: AC
  Filled 2023-04-26: qty 100

## 2023-04-26 MED ORDER — CHLORHEXIDINE GLUCONATE 0.12 % MT SOLN
15.0000 mL | Freq: Once | OROMUCOSAL | Status: AC
  Administered 2023-04-26: 15 mL via OROMUCOSAL

## 2023-04-26 MED ORDER — FENTANYL CITRATE (PF) 100 MCG/2ML IJ SOLN
INTRAMUSCULAR | Status: AC
  Filled 2023-04-26: qty 2

## 2023-04-26 MED ORDER — ALBUMIN HUMAN 5 % IV SOLN
INTRAVENOUS | Status: AC
  Filled 2023-04-26: qty 250

## 2023-04-26 MED ORDER — ONDANSETRON HCL 4 MG/2ML IJ SOLN
INTRAMUSCULAR | Status: DC | PRN
  Administered 2023-04-26: 4 mg via INTRAVENOUS

## 2023-04-26 MED ORDER — ACETAMINOPHEN 500 MG PO TABS
1000.0000 mg | ORAL_TABLET | Freq: Once | ORAL | Status: AC
  Administered 2023-04-26: 1000 mg via ORAL
  Filled 2023-04-26: qty 2

## 2023-04-26 MED ORDER — ALBUMIN HUMAN 5 % IV SOLN: INTRAVENOUS | Status: DC | PRN

## 2023-04-26 MED ORDER — CEFAZOLIN SODIUM-DEXTROSE 2-4 GM/100ML-% IV SOLN
2.0000 g | INTRAVENOUS | Status: AC
  Administered 2023-04-26: 2 g via INTRAVENOUS
  Filled 2023-04-26: qty 100

## 2023-04-26 MED ORDER — LACTATED RINGERS IV SOLN: INTRAVENOUS | Status: DC

## 2023-04-26 MED ORDER — LACTATED RINGERS IV BOLUS
500.0000 mL | Freq: Once | INTRAVENOUS | Status: AC
  Administered 2023-04-26: 500 mL via INTRAVENOUS

## 2023-04-26 MED ORDER — ACETAMINOPHEN 500 MG PO TABS: 1000.0000 mg | ORAL_TABLET | Freq: Three times a day (TID) | ORAL | 0 refills | Status: AC | PRN

## 2023-04-26 MED ORDER — KETOROLAC TROMETHAMINE 15 MG/ML IJ SOLN
INTRAMUSCULAR | Status: AC
  Administered 2023-04-26: 15 mg via INTRAVENOUS
  Filled 2023-04-26: qty 1

## 2023-04-26 MED ORDER — PHENYLEPHRINE HCL (PRESSORS) 10 MG/ML IV SOLN
INTRAVENOUS | Status: AC
  Filled 2023-04-26: qty 1

## 2023-04-26 MED ORDER — MIDAZOLAM HCL 5 MG/5ML IJ SOLN
INTRAMUSCULAR | Status: DC | PRN
  Administered 2023-04-26 (×2): 1 mg via INTRAVENOUS

## 2023-04-26 MED ORDER — PROPOFOL 10 MG/ML IV BOLUS
INTRAVENOUS | Status: AC
  Filled 2023-04-26: qty 20

## 2023-04-26 MED ORDER — SODIUM CHLORIDE (PF) 0.9 % IJ SOLN
INTRAMUSCULAR | Status: AC
  Filled 2023-04-26: qty 50

## 2023-04-26 MED ORDER — METHOCARBAMOL 500 MG PO TABS: 500.0000 mg | ORAL_TABLET | Freq: Three times a day (TID) | ORAL | 0 refills | Status: AC | PRN

## 2023-04-26 MED ORDER — OXYCODONE HCL 5 MG/5ML PO SOLN: 5.0000 mg | Freq: Once | ORAL | Status: DC | PRN

## 2023-04-26 MED ORDER — METHOCARBAMOL 500 MG PO TABS: 500.0000 mg | ORAL_TABLET | Freq: Four times a day (QID) | ORAL | Status: DC | PRN

## 2023-04-26 MED ORDER — TRANEXAMIC ACID-NACL 1000-0.7 MG/100ML-% IV SOLN
1000.0000 mg | INTRAVENOUS | Status: AC
  Administered 2023-04-26: 1000 mg via INTRAVENOUS
  Filled 2023-04-26: qty 100

## 2023-04-26 MED ORDER — EPHEDRINE 5 MG/ML INJ
INTRAVENOUS | Status: AC
  Filled 2023-04-26: qty 5

## 2023-04-26 MED ORDER — DEXAMETHASONE SODIUM PHOSPHATE 10 MG/ML IJ SOLN
INTRAMUSCULAR | Status: DC | PRN
  Administered 2023-04-26: 10 mg via INTRAVENOUS

## 2023-04-26 MED ORDER — EPHEDRINE SULFATE-NACL 50-0.9 MG/10ML-% IV SOSY
PREFILLED_SYRINGE | INTRAVENOUS | Status: DC | PRN
  Administered 2023-04-26: 5 mg via INTRAVENOUS

## 2023-04-26 MED ORDER — MIDAZOLAM HCL 2 MG/2ML IJ SOLN
INTRAMUSCULAR | Status: AC
  Filled 2023-04-26: qty 2

## 2023-04-26 MED ORDER — SODIUM CHLORIDE 0.9 % IV SOLN: INTRAVENOUS | Status: DC

## 2023-04-26 MED ORDER — OXYCODONE HCL 5 MG PO TABS: 5.0000 mg | ORAL_TABLET | ORAL | Status: DC | PRN

## 2023-04-26 MED ORDER — BUPIVACAINE LIPOSOME 1.3 % IJ SUSP
INTRAMUSCULAR | Status: AC
  Filled 2023-04-26: qty 20

## 2023-04-26 MED ORDER — SODIUM CHLORIDE 0.9% FLUSH
INTRAVENOUS | Status: DC | PRN
  Administered 2023-04-26: 60 mL

## 2023-04-26 MED ORDER — SODIUM CHLORIDE (PF) 0.9 % IJ SOLN
INTRAMUSCULAR | Status: AC
  Filled 2023-04-26: qty 10

## 2023-04-26 MED ORDER — ONDANSETRON HCL 4 MG PO TABS: 4.0000 mg | ORAL_TABLET | Freq: Three times a day (TID) | ORAL | 0 refills | Status: AC | PRN

## 2023-04-26 MED ORDER — ORAL CARE MOUTH RINSE: 15.0000 mL | Freq: Once | OROMUCOSAL | Status: AC

## 2023-04-26 MED ORDER — FENTANYL CITRATE (PF) 100 MCG/2ML IJ SOLN
INTRAMUSCULAR | Status: DC | PRN
  Administered 2023-04-26 (×2): 50 ug via INTRAVENOUS

## 2023-04-26 MED ORDER — METHOCARBAMOL 500 MG IVPB - SIMPLE MED
INTRAVENOUS | Status: AC
  Filled 2023-04-26: qty 55

## 2023-04-26 MED ORDER — METHOCARBAMOL 500 MG IVPB - SIMPLE MED
500.0000 mg | Freq: Four times a day (QID) | INTRAVENOUS | Status: DC | PRN
  Administered 2023-04-26: 500 mg via INTRAVENOUS

## 2023-04-26 MED ORDER — PROPOFOL 500 MG/50ML IV EMUL
INTRAVENOUS | Status: DC | PRN
  Administered 2023-04-26: 50 ug/kg/min via INTRAVENOUS

## 2023-04-26 MED ORDER — HYDROMORPHONE HCL 1 MG/ML IJ SOLN: 0.2500 mg | INTRAMUSCULAR | Status: DC | PRN

## 2023-04-26 MED ORDER — DEXAMETHASONE SODIUM PHOSPHATE 10 MG/ML IJ SOLN
INTRAMUSCULAR | Status: AC
  Filled 2023-04-26: qty 1

## 2023-04-26 MED ORDER — BUPIVACAINE IN DEXTROSE 0.75-8.25 % IT SOLN
INTRATHECAL | Status: DC | PRN
  Administered 2023-04-26: 1.6 mL via INTRATHECAL

## 2023-04-26 MED ORDER — 0.9 % SODIUM CHLORIDE (POUR BTL) OPTIME
TOPICAL | Status: DC | PRN
  Administered 2023-04-26: 1000 mL

## 2023-04-26 MED ORDER — ISOPROPYL ALCOHOL 70 % SOLN
Status: DC | PRN
  Administered 2023-04-26: 1 via TOPICAL

## 2023-04-26 MED ORDER — PHENYLEPHRINE HCL-NACL 20-0.9 MG/250ML-% IV SOLN
INTRAVENOUS | Status: DC | PRN
  Administered 2023-04-26: 50 ug/min via INTRAVENOUS

## 2023-04-26 MED ORDER — OXYCODONE HCL 5 MG PO TABS: 5.0000 mg | ORAL_TABLET | ORAL | 0 refills | Status: AC | PRN

## 2023-04-26 MED ORDER — LIDOCAINE HCL (PF) 2 % IJ SOLN
INTRAMUSCULAR | Status: AC
  Filled 2023-04-26: qty 5

## 2023-04-26 MED ORDER — BUPIVACAINE LIPOSOME 1.3 % IJ SUSP
INTRAMUSCULAR | Status: DC | PRN
  Administered 2023-04-26: 20 mL

## 2023-04-26 MED ORDER — SODIUM CHLORIDE 0.9 % IR SOLN
Status: DC | PRN
  Administered 2023-04-26: 3000 mL

## 2023-04-26 MED ORDER — POVIDONE-IODINE 10 % EX SWAB: 2.0000 | Freq: Once | CUTANEOUS | Status: DC

## 2023-04-26 MED ORDER — DEXAMETHASONE SODIUM PHOSPHATE 10 MG/ML IJ SOLN: 8.0000 mg | Freq: Once | INTRAMUSCULAR | Status: DC

## 2023-04-26 MED ORDER — PROPOFOL 10 MG/ML IV BOLUS
INTRAVENOUS | Status: DC | PRN
  Administered 2023-04-26: 20 mg via INTRAVENOUS

## 2023-04-26 MED ORDER — ONDANSETRON HCL 4 MG/2ML IJ SOLN: 4.0000 mg | Freq: Once | INTRAMUSCULAR | Status: DC | PRN

## 2023-04-26 SURGICAL SUPPLY — 71 items
ADH SKN CLS APL DERMABOND .7 (GAUZE/BANDAGES/DRESSINGS) ×2
APL PRP STRL LF DISP 70% ISPRP (MISCELLANEOUS) ×2
BAG COUNTER SPONGE SURGICOUNT (BAG) IMPLANT
BAG SPNG CNTER NS LX DISP (BAG) ×1
BLADE SAG 18X100X1.27 (BLADE) ×1 IMPLANT
BLADE SAW SAG 35X64 .89 (BLADE) ×1 IMPLANT
BNDG CMPR 5X3 CHSV STRCH STRL (GAUZE/BANDAGES/DRESSINGS) ×1
BNDG CMPR 5X4 CHSV STRCH STRL (GAUZE/BANDAGES/DRESSINGS) ×1
BNDG CMPR MED 10X6 ELC LF (GAUZE/BANDAGES/DRESSINGS) ×1
BNDG COHESIVE 3X5 TAN ST LF (GAUZE/BANDAGES/DRESSINGS) ×1 IMPLANT
BNDG COHESIVE 4X5 TAN STRL LF (GAUZE/BANDAGES/DRESSINGS) IMPLANT
BNDG ELASTIC 6X10 VLCR STRL LF (GAUZE/BANDAGES/DRESSINGS) ×1 IMPLANT
BOWL SMART MIX CTS (DISPOSABLE) ×1 IMPLANT
CEMENT BONE R 1X40 (Cement) IMPLANT
CEMENT BONE REFOBACIN R1X40 US (Cement) IMPLANT
CHLORAPREP W/TINT 26 (MISCELLANEOUS) ×2 IMPLANT
COMP FEM CMT PS STD 11 RT (Joint) ×1 IMPLANT
COMP TIB KNEE PS G 0 RT (Joint) ×1 IMPLANT
COMPONENT FEM CMT PS STD 11 RT (Joint) IMPLANT
COMPONENT TIB KNEE PS G 0 RT (Joint) IMPLANT
COVER SURGICAL LIGHT HANDLE (MISCELLANEOUS) ×1 IMPLANT
CUFF TOURN SGL QUICK 34 (TOURNIQUET CUFF) ×1
CUFF TRNQT CYL 34X4.125X (TOURNIQUET CUFF) ×1 IMPLANT
DERMABOND ADVANCED .7 DNX12 (GAUZE/BANDAGES/DRESSINGS) ×1 IMPLANT
DRAPE INCISE IOBAN 85X60 (DRAPES) ×1 IMPLANT
DRAPE SHEET LG 3/4 BI-LAMINATE (DRAPES) ×1 IMPLANT
DRAPE U-SHAPE 47X51 STRL (DRAPES) ×1 IMPLANT
DRESSING AQUACEL AG SP 3.5X10 (GAUZE/BANDAGES/DRESSINGS) ×1 IMPLANT
DRSG AQUACEL AG ADV 3.5X10 (GAUZE/BANDAGES/DRESSINGS) IMPLANT
DRSG AQUACEL AG SP 3.5X10 (GAUZE/BANDAGES/DRESSINGS) ×1
ELECT REM PT RETURN 15FT ADLT (MISCELLANEOUS) ×1 IMPLANT
GAUZE SPONGE 4X4 12PLY STRL (GAUZE/BANDAGES/DRESSINGS) ×1 IMPLANT
GLOVE BIO SURGEON STRL SZ 6.5 (GLOVE) ×2 IMPLANT
GLOVE BIOGEL PI IND STRL 6.5 (GLOVE) ×1 IMPLANT
GLOVE BIOGEL PI IND STRL 8 (GLOVE) ×1 IMPLANT
GLOVE SURG ORTHO 8.0 STRL STRW (GLOVE) ×2 IMPLANT
GOWN STRL REUS W/ TWL XL LVL3 (GOWN DISPOSABLE) ×2 IMPLANT
GOWN STRL REUS W/TWL XL LVL3 (GOWN DISPOSABLE) ×2
HANDPIECE INTERPULSE COAX TIP (DISPOSABLE) ×1
HOLDER FOLEY CATH W/STRAP (MISCELLANEOUS) ×1 IMPLANT
HOOD PEEL AWAY T7 (MISCELLANEOUS) ×3 IMPLANT
INSERT TIBIAL PERSONA 10 RT (Insert) IMPLANT
KIT TURNOVER KIT A (KITS) IMPLANT
MANIFOLD NEPTUNE II (INSTRUMENTS) ×1 IMPLANT
MARKER SKIN DUAL TIP RULER LAB (MISCELLANEOUS) ×1 IMPLANT
NS IRRIG 1000ML POUR BTL (IV SOLUTION) ×1 IMPLANT
PACK TOTAL KNEE CUSTOM (KITS) ×1 IMPLANT
PATELLA STD SZ 38X10 (Miscellaneous) IMPLANT
PIN DRILL HDLS TROCAR 75 4PK (PIN) IMPLANT
SCREW HEADED 33MM KNEE (MISCELLANEOUS) IMPLANT
SET HNDPC FAN SPRY TIP SCT (DISPOSABLE) ×1 IMPLANT
SOLUTION IRRIG SURGIPHOR (IV SOLUTION) IMPLANT
SPIKE FLUID TRANSFER (MISCELLANEOUS) ×1 IMPLANT
STRIP CLOSURE SKIN 1/2X4 (GAUZE/BANDAGES/DRESSINGS) ×1 IMPLANT
SUT MNCRL AB 3-0 PS2 18 (SUTURE) ×1 IMPLANT
SUT STRATAFIX 0 PDS 27 VIOLET (SUTURE) ×1
SUT STRATAFIX 1PDS 45CM VIOLET (SUTURE) IMPLANT
SUT STRATAFIX PDO 1 14 VIOLET (SUTURE) ×1
SUT STRATFX PDO 1 14 VIOLET (SUTURE) ×1
SUT VIC AB 2-0 CT2 27 (SUTURE) ×2 IMPLANT
SUTURE STRATFX 0 PDS 27 VIOLET (SUTURE) ×1 IMPLANT
SUTURE STRATFX PDO 1 14 VIOLET (SUTURE) ×1 IMPLANT
SYR 50ML LL SCALE MARK (SYRINGE) ×1 IMPLANT
TIBIAL INSERT PERSONA 10 RT (Insert) ×1 IMPLANT
TRAY FOL W/BAG SLVR 16FR STRL (SET/KITS/TRAYS/PACK) IMPLANT
TRAY FOLEY MTR SLVR 14FR STAT (SET/KITS/TRAYS/PACK) IMPLANT
TRAY FOLEY W/BAG SLVR 16FR LF (SET/KITS/TRAYS/PACK) ×1
TUBE SUCTION HIGH CAP CLEAR NV (SUCTIONS) ×1 IMPLANT
UNDERPAD 30X36 HEAVY ABSORB (UNDERPADS AND DIAPERS) ×1 IMPLANT
WATER STERILE IRR 1000ML POUR (IV SOLUTION) IMPLANT
WRAP KNEE MAXI GEL POST OP (GAUZE/BANDAGES/DRESSINGS) IMPLANT

## 2023-04-26 NOTE — Discharge Instructions (Signed)

## 2023-04-26 NOTE — Anesthesia Procedure Notes (Signed)
Spinal  Patient location during procedure: OR Start time: 04/26/2023 7:16 AM End time: 04/26/2023 7:20 AM Reason for block: surgical anesthesia Staffing Performed: anesthesiologist  Anesthesiologist: Eilene Ghazi, MD Performed by: Eilene Ghazi, MD Authorized by: Eilene Ghazi, MD   Preanesthetic Checklist Completed: patient identified, IV checked, site marked, risks and benefits discussed, surgical consent, monitors and equipment checked, pre-op evaluation and timeout performed Spinal Block Patient position: sitting Prep: Betadine Patient monitoring: heart rate, continuous pulse ox and blood pressure Approach: midline Location: L3-4 Injection technique: single-shot Needle Needle type: Sprotte  Needle gauge: 24 G Needle length: 9 cm Assessment Sensory level: T6 Events: CSF return Additional Notes

## 2023-04-26 NOTE — Progress Notes (Signed)
Orthopedic Tech Progress Note Patient Details:  Steven Ford [birth year] [id number]  Ortho Devices Type of Ortho Device: Bone foam zero knee Ortho Device/Splint Location: Right knee Ortho Device/Splint Interventions: Application   Post Interventions Patient Tolerated: Well  [name] [name] 04/26/2023, 10:29 AM

## 2023-04-26 NOTE — Evaluation (Signed)
Physical Therapy Evaluation Patient Details Name: Steven Ford MRN: [id number] DOB: 09-13-1961 Today's Date: 04/26/2023  History of Present Illness  Pt is a 62yo male presenting s/p R TKA on 04/26/23,. PMH: L THA; PAD, cardiomyopathy, CAD, HLD, HTN, CABG 2002 and 2016,  Clinical Impression  Pt is s/p TKA resulting in the deficits listed below (see PT Problem List). At baseline, pt independent and works as a history Runner, broadcasting/film/video.  He has home support and DME.  Today, pt seen in PACU for possible same day discharge.  He was able to ambulate 14' with RW and min guard for safety.  Pt with good ROM, pain control, and quad activation. Pt expected to progress well.  Pt demonstrates safe gait & transfers in order to return home from PT perspective once discharged by MD.  While in hospital, will continue to benefit from PT for skilled therapy to advance mobility and exercises.           Recommendations for follow up therapy are one component of a multi-disciplinary discharge planning process, led by the attending physician.  Recommendations may be updated based on patient status, additional functional criteria and insurance authorization.  Follow Up Recommendations       Assistance Recommended at Discharge Intermittent Supervision/Assistance  Patient can return home with the following  A little help with walking and/or transfers;A little help with bathing/dressing/bathroom;Assistance with cooking/housework;Help with stairs or ramp for entrance    Equipment Recommendations None recommended by PT  Recommendations for Other Services       Functional Status Assessment Patient has had a recent decline in their functional status and demonstrates the ability to make significant improvements in function in a reasonable and predictable amount of time.     Precautions / Restrictions Precautions Precautions: Knee Restrictions Weight Bearing Restrictions: Yes RLE Weight Bearing: Weight bearing as  tolerated      Mobility  Bed Mobility Overal bed mobility: Needs Assistance Bed Mobility: Supine to Sit     Supine to sit: Supervision          Transfers Overall transfer level: Needs assistance Equipment used: Rolling walker (2 wheels) Transfers: Sit to/from Stand Sit to Stand: Min guard           General transfer comment: cues for  LE management    Ambulation/Gait Ambulation/Gait assistance: Min guard Gait Distance (Feet): 60 Feet Assistive device: Rolling walker (2 wheels) Gait Pattern/deviations: Step-through pattern, Decreased stride length Gait velocity: decreased     General Gait Details: Slight decrease in weight shift to R with good RW proximity.  Educated on step to R gait if needed for pain control  Stairs            Wheelchair Mobility    Modified Rankin (Stroke Patients Only)       Balance Overall balance assessment: Needs assistance Sitting-balance support: No upper extremity supported Sitting balance-Leahy Scale: Good     Standing balance support: Bilateral upper extremity supported, No upper extremity supported Standing balance-Leahy Scale: Fair Standing balance comment: RW to ambulate but able to static stand without support                             Pertinent Vitals/Pain Pain Assessment Pain Assessment: 0-10 Pain Score: 2  Pain Location: R knee Pain Descriptors / Indicators: Discomfort Pain Intervention(s): Limited activity within patient's tolerance, Monitored during session, Premedicated before session    Home Living Family/patient expects to be  discharged to:: Private residence Living Arrangements: Spouse/significant other;Children Available Help at Discharge: Family;Available 24 hours/day Type of Home: House Home Access: Level entry       Home Layout: Two level;Able to live on main level with bedroom/bathroom;Full bath on main level Home Equipment: Cane - single point;Rolling Walker (2 wheels)       Prior Function Prior Level of Function : Independent/Modified Independent;Working/employed             Mobility Comments: independent ADLs Comments: independent     Hand Dominance        Extremity/Trunk Assessment   Upper Extremity Assessment Upper Extremity Assessment: Overall WFL for tasks assessed    Lower Extremity Assessment Lower Extremity Assessment: LLE deficits/detail;RLE deficits/detail RLE Deficits / Details: Expected post op changes; ROM: ankle and hip WFL, knee ~5 to 85 degrees; MMT: ankle 5/5, knee and hip 3/5 RLE Sensation: WNL LLE Deficits / Details: ROM WFL; MMT 5/5 LLE Sensation: WNL    Cervical / Trunk Assessment Cervical / Trunk Assessment: Normal  Communication   Communication: No difficulties  Cognition Arousal/Alertness: Awake/alert Behavior During Therapy: WFL for tasks assessed/performed Overall Cognitive Status: Within Functional Limits for tasks assessed                                          General Comments  Educated on safe ice use, no pivots, car transfers, resting with leg straight, and TED hose during day. Also, encouraged walking every 1-2 hours during day. Educated on HEP with focus on mobility the first weeks. Discussed doing exercises within pain control and if pain increasing could decreased ROM, reps, and stop exercises as needed. Encouraged to perform quad sets and ankle pumps frequently for blood flow and to promote full knee extension.     Exercises Total Joint Exercises Ankle Circles/Pumps: AROM, Both, 5 reps, Supine Quad Sets: AROM, Right, 5 reps, Supine Heel Slides: AAROM, Right, 5 reps, Supine Hip ABduction/ADduction: AAROM, Right, 5 reps, Supine Long Arc Quad: AROM, Right, 5 reps, Seated Knee Flexion: AROM, Right, 5 reps, Seated Goniometric ROM: R knee ~5 to 85 Other Exercises Other Exercises: Educated on exercises to tolerance and AAROM techniques if needed   Assessment/Plan    PT Assessment  Patient needs continued PT services  PT Problem List Decreased strength;Decreased range of motion;Decreased activity tolerance;Decreased balance;Decreased mobility;Pain;Decreased knowledge of use of DME       PT Treatment Interventions DME instruction;Therapeutic exercise;Gait training;Balance training;Stair training;Functional mobility training;Therapeutic activities;Patient/family education;Modalities    PT Goals (Current goals can be found in the Care Plan section)  Acute Rehab PT Goals Patient Stated Goal: return home PT Goal Formulation: With patient/family Time For Goal Achievement: 05/10/23 Potential to Achieve Goals: Good    Frequency 7X/week     Co-evaluation               AM-PAC PT "6 Clicks" Mobility  Outcome Measure Help needed turning from your back to your side while in a flat bed without using bedrails?: None Help needed moving from lying on your back to sitting on the side of a flat bed without using bedrails?: None Help needed moving to and from a bed to a chair (including a wheelchair)?: A Little Help needed standing up from a chair using your arms (e.g., wheelchair or bedside chair)?: A Little Help needed to walk in hospital room?: A Little Help needed climbing  3-5 steps with a railing? : A Little 6 Click Score: 20    End of Session Equipment Utilized During Treatment: Gait belt Activity Tolerance: Patient tolerated treatment well Patient left: in chair;with call bell/phone within reach;with family/visitor present (in PACU) Nurse Communication: Mobility status;Other (comment) (Passed therapy but was unable to urinate) PT Visit Diagnosis: Other abnormalities of gait and mobility (R26.89);Muscle weakness (generalized) (M62.81)    Time: 1610-9604 PT Time Calculation (min) (ACUTE ONLY): 29 min   Charges:   PT Evaluation $PT Eval Low Complexity: 1 Low PT Treatments $Therapeutic Exercise: 8-22 mins        Anise Salvo, PT Acute Rehab [facility]  Rehab [phone]   Rayetta Humphrey 04/26/2023, 12:32 PM

## 2023-04-26 NOTE — Anesthesia Procedure Notes (Signed)
Anesthesia Regional Block: Adductor canal block   Pre-Anesthetic Checklist: , timeout performed,  Correct Patient, Correct Site, Correct Laterality,  Correct Procedure, Correct Position, site marked,  Risks and benefits discussed,  Surgical consent,  Pre-op evaluation,  At surgeon's request and post-op pain management  Laterality: Right  Prep: chloraprep       Needles:  Injection technique: Single-shot  Needle Type: Echogenic Needle     Needle Length: 9cm      Additional Needles:   Procedures:,,,, ultrasound used (permanent image in chart),,    Narrative:  Start time: 04/26/2023 6:51 AM End time: 04/26/2023 6:57 AM Injection made incrementally with aspirations every 5 mL.  Performed by: Personally  Anesthesiologist: Eilene Ghazi, MD  Additional Notes: Patient tolerated the procedure well without complications

## 2023-04-26 NOTE — Interval H&P Note (Signed)
The patient has been re-examined, and the chart reviewed, and there have been no interval changes to the documented history and physical.    Plan for R TKA for end stage R knee OA  The operative side was examined and the patient was confirmed to have sensation to DPN, SPN, TN intact, Motor EHL, ext, flex 5/5, and DP 2+, PT 2+, No significant edema.   The risks, benefits, and alternatives have been discussed at length with patient, and the patient is willing to proceed.  Right knee marked. Consent has been signed.

## 2023-04-26 NOTE — Anesthesia Procedure Notes (Signed)
Anesthesia Procedure Image    

## 2023-04-26 NOTE — Transfer of Care (Signed)
Immediate Anesthesia Transfer of Care Note  Patient: Steven Ford  Procedure(s) Performed: TOTAL KNEE ARTHROPLASTY (Right: Knee)  Patient Location: PACU  Anesthesia Type:Spinal  Level of Consciousness: awake and alert   Airway & Oxygen Therapy: Patient Spontanous Breathing and Patient connected to face mask oxygen  Post-op Assessment: Report given to RN and Post -op Vital signs reviewed and stable  Post vital signs: Reviewed and stable  Last Vitals:  Vitals Value Taken Time  BP 114/71 04/26/23 0953  Temp    Pulse 62 04/26/23 0955  Resp 11 04/26/23 0955  SpO2 100 % 04/26/23 0955  Vitals shown include unvalidated device data.  Last Pain:  Vitals:   04/26/23 0617  TempSrc:   PainSc: 0-No pain      Patients Stated Pain Goal: 3 (04/26/23 0617)  Complications: No notable events documented.

## 2023-04-26 NOTE — Anesthesia Postprocedure Evaluation (Signed)
Anesthesia Post Note  Patient: Steven Ford  Procedure(s) Performed: TOTAL KNEE ARTHROPLASTY (Right: Knee)     Patient location during evaluation: PACU Anesthesia Type: Spinal Level of consciousness: oriented and awake and alert Pain management: pain level controlled Vital Signs Assessment: post-procedure vital signs reviewed and stable Respiratory status: spontaneous breathing, respiratory function stable and patient connected to nasal cannula oxygen Cardiovascular status: blood pressure returned to baseline and stable Postop Assessment: no headache, no backache and no apparent nausea or vomiting Anesthetic complications: no  No notable events documented.  Last Vitals:  Vitals:   04/26/23 1045 04/26/23 1055  BP: 109/76 123/77  Pulse: (!) 56 (!) 56  Resp: 14 16  Temp: 36.7 C 36.9 C  SpO2: 95% 95%    Last Pain:  Vitals:   04/26/23 1055  TempSrc: Oral  PainSc:                  Auda Finfrock S

## 2023-04-26 NOTE — Op Note (Signed)
DATE OF SURGERY:  04/26/2023 TIME: 9:13 AM  PATIENT NAME:  Steven Ford   AGE: 62 y.o.    PRE-OPERATIVE DIAGNOSIS: End-stage right knee osteoarthritis  POST-OPERATIVE DIAGNOSIS:  Same  PROCEDURE: Right tourniquetless, press-fit total Knee Arthroplasty  SURGEON:  Clelia Trabucco A Lafonda Patron, MD   ASSISTANT: Kathie Dike, PA-C, present and scrubbed throughout the case, critical for assistance with exposure, retraction, instrumentation, and closure.   OPERATIVE IMPLANTS:  Cemented Zimmer persona size 11 standard CR femur right, press-fit tibial baseplate size G [place] Ti, 10 mm MC poly, 38 mm press-fit NexGen trabecular metal patella Implant Name Type Inv. Item Serial No. Manufacturer Lot No. LRB No. Used Action  COMP TIB KNEE PS G 0 RT - [id number] Joint COMP TIB KNEE PS G 0 RT  ZIMMER [employer]ON(ORTH,TRAU,BIO,SG) [id number] Right 1 Implanted  COMP FEM CMT PS STD 11 RT - [id number] Joint COMP FEM CMT PS STD 11 RT  ZIMMER [employer]ON(ORTH,TRAU,BIO,SG) [id number] Right 1 Implanted  TIBIAL INSERT PERSONA 10 RT - [id number] Insert TIBIAL INSERT PERSONA 10 RT  ZIMMER [employer]ON(ORTH,TRAU,BIO,SG) [id number] Right 1 Implanted  PATELLA STD SZ 38X10 - [id number] Miscellaneous PATELLA STD SZ 38X10  ZIMMER [employer]ON(ORTH,TRAU,BIO,SG) [id number] Right 1 Implanted      PREOPERATIVE INDICATIONS:  [name] is a 62 y.o. year old male with end stage bone on bone degenerative arthritis of the knee who failed conservative treatment, including injections, antiinflammatories, activity modification, and assistive devices, and had significant impairment of their activities of daily living, and elected for Total Knee Arthroplasty.   The risks, benefits, and alternatives were discussed at length including but not limited to the risks of infection, bleeding, nerve injury, stiffness, blood clots, the need for revision surgery, cardiopulmonary complications, among others, and they were willing to proceed.  ESTIMATED BLOOD LOSS:  500cc   OPERATIVE DESCRIPTION:   Once adequate anesthesia was induced, preoperative antibiotics, 2 gm of ancef,1 gm of Tranexamic Acid, and 8 mg of Decadron administered, the patient was positioned supine with a right thigh tourniquet placed.  The right lower extremity was prepped and draped in sterile fashion.  A time-  out was performed identifying the patient, planned procedure, and the appropriate extremity.     Tourniquet was not inflated given patient's peripheral arterial disease.  A midline incision was  made followed by median parapatellar arthrotomy. Anterior horn of the medial meniscus was released and resected. A medial release was performed, the infrapatellar fat pad was resected with care taken to protect the patellar tendon. The suprapatellar fat was removed to exposed the distal anterior femur. The anterior horn of the lateral meniscus and ACL were released.    Following initial  exposure, I first started with the femur  The femoral  canal was opened with a drill, canal was suctioned to try to prevent fat emboli.  An  intramedullary rod was passed set at 6 degrees valgus, 10 mm. The distal femur was resected.  Following this resection, the tibia was  subluxated anteriorly.  Using the extramedullary guide, 10 mm of bone was resected off   the proximal lateral tibia.  We confirmed the gap would be  stable medially and laterally with a size 10mm spacer block as well as confirmed that the tibial cut was perpendicular in the coronal plane, checking with an alignment rod.    Once this was done, the posterior femoral referencing femoral sizer was placed under to the posterior condyles with 3 degrees of external rotational which was parallel to the transepicondylar  axis and perpendicular to [employer]. The femur was sized to be a size 11 in the anterior-  posterior dimension. The  anterior, posterior, and  chamfer cuts were made without difficulty nor   notching making certain that I was  along the anterior cortex to help  with flexion gap stability. Next a laminar spreader was placed with the knee in flexion and the medial lateral menisci were resected.  5 cc of the Exparel mixture was injected in the medial side of the back of the knee and 3 cc in the lateral side.  1/2 inch curved osteotome was used to resect posterior osteophyte that was then removed with a pituitary rongeur.       At this point, the tibia was sized to be a size G.  The size G tray was  then pinned in position. Trial reduction was now carried with a 11 femur, G tibia, a 10 mm MC insert.  The knee had full extension and was stable to varus valgus stress in extension.  The knee was slightly tight in flexion and the PCL was partially released.   Attention was next directed to the patella.  Precut  measurement was noted to be 26 mm.  I resected down to 13 mm and used a  38mm patellar button to restore patellar height as well as cover the cut surface.     The patella lug holes were drilled and a 38 mm patella poly trial was placed.    The knee was brought to full extension with good flexion stability with the patella tracking through the trochlea without application of pressure.    Next the femoral component was again assessed and determined to be seated and appropriately lateralized.  The femoral lug holes were drilled.  The femoral component was then removed. Tibial component was again assessed and felt to be seated and appropriately rotated with the medial third of the tubercle. The tibia was then drilled, and keel punched.     Final components were  opened and impacted into place.   The knee was irrigated with sterile Betadine diluted in saline as well as pulse lavage normal saline. The synovial lining was  then injected a dilute Exparel.     I confirmed that I was satisfied with the range of motion and stability, and the final 10 mm MC poly insert was chosen.  It was placed into the knee.       No significant  hemostasis was required.  The medial parapatellar arthrotomy was then reapproximated using #1 Stratafix sutures with the knee  in flexion.  The remaining wound was closed with 0 stratafix, 2-0 Vicryl, and running 3-0 Monocryl. The knee was cleaned, dried, dressed sterilely using Dermabond and   Aquacel dressing.  The patient was then brought to recovery room in stable condition, tolerating the procedure  well. There were no complications.   Post op recs: WB: WBAT Abx: ancef Imaging: PACU xrays DVT prophylaxis: Eliquis postop Follow up: 2 weeks after surgery for a wound check with Dr. Blanchie Dessert at [facility].  Address: [address], [place], [place] [address]  Office Phone: [phone]  Weber Cooks, MD Orthopaedic Surgery

## 2023-04-26 NOTE — Anesthesia Procedure Notes (Signed)
Date/Time: 04/26/2023 7:17 AM  Performed by: Florene Route, CRNAOxygen Delivery Method: Simple face mask

## 2023-04-30 ENCOUNTER — Ambulatory Visit (INDEPENDENT_AMBULATORY_CARE_PROVIDER_SITE_OTHER): Payer: BC Managed Care – PPO

## 2023-04-30 ENCOUNTER — Other Ambulatory Visit (HOSPITAL_BASED_OUTPATIENT_CLINIC_OR_DEPARTMENT_OTHER): Payer: Self-pay | Admitting: Orthopedic Surgery

## 2023-04-30 DIAGNOSIS — M7989 Other specified soft tissue disorders: Secondary | ICD-10-CM | POA: Diagnosis not present

## 2023-06-24 ENCOUNTER — Other Ambulatory Visit: Payer: Self-pay | Admitting: Cardiology

## 2023-07-21 ENCOUNTER — Other Ambulatory Visit: Payer: Self-pay | Admitting: Cardiology

## 2023-09-04 ENCOUNTER — Telehealth: Payer: Self-pay | Admitting: Cardiology

## 2023-09-04 NOTE — Telephone Encounter (Signed)
Spoke with Steven Ford who states he feels he needs further evaluation of his stents as he has developed some tingling in his feet.  Hx of PAD.  Steven Ford denies current CP, SOB, or dizziness.  Steven Ford advised he is past due to see Dr Allyson Sabal and will need a follow up appointment as well as he is due to see Dr Bing Matter 09/2023.  Will forward for assistance with sooner appointment.  Steven Ford verbalizes understanding and agrees with current plan.

## 2023-09-04 NOTE — Telephone Encounter (Signed)
Pt states he needs a Doppler and would like to speak with the nurse. Please advise

## 2023-09-09 ENCOUNTER — Encounter: Payer: BC Managed Care – PPO | Admitting: Nurse Practitioner

## 2023-09-10 ENCOUNTER — Encounter: Payer: BC Managed Care – PPO | Admitting: Family Medicine

## 2023-09-10 NOTE — Progress Notes (Deleted)
   Annual physical  Subjective   Patient ID: Steven Ford, male    DOB: March 19, 1961  Age: 62 y.o. MRN: [id number]  No chief complaint on file.  HPI Steven Ford is a 62 y.o. old male here  for annual exam.   Changes in his/her health in the last 12 months: {yes/no:63}  The patient currently works as a***/does not work***.  He is married/single/in a relationship and recently/not recently sexually active with ***male partner.  He does*** use tobacco***, drinks***days per***, does***use recreational drugs.  The patient eats a***diet.  He exercises***times per week doing***.  The patient does***have an advanced directive.  The patient declines/endorses concerns for impaired hearing.  The patient declines/endorses concerns for impaired vision.  The patient does/does not see a dentist regularly.  The patient does***have a family history of prostate cancer.  The patient does***have a family history of colon cancer.  The patient has the following chronic health issues that are monitored on a routine basis: ***  Hyperlipidemia-taking Zetia and statin?  CAD-post bypass  PAD  Health maintenance issues: Colonoscopy  HPI  Separate, acute concerns today: ***  The ASCVD Risk score (Arnett DK, et al., 2019) failed to calculate for the following reasons:   The valid total cholesterol range is 130 to 320 mg/dL  Health Maintenance Due  Topic Date Due   HIV Screening  Never done   Hepatitis C Screening  Never done   Colonoscopy  Never done   Zoster Vaccines- Shingrix (1 of 2) Never done   DTaP/Tdap/Td (2 - Td or Tdap) 07/31/2021   COVID-19 Vaccine (5 - 2023-24 season) 07/28/2023      Objective:     There were no vitals taken for this visit. {Vitals History (Optional):23777}  Physical Exam   No results found for any visits on 09/10/23.      Assessment & Plan:   There are no diagnoses linked to this encounter.   No follow-ups on file.    Sandre Kitty, MD

## 2023-09-12 ENCOUNTER — Encounter: Payer: Self-pay | Admitting: Family Medicine

## 2023-09-20 ENCOUNTER — Encounter: Payer: Self-pay | Admitting: Cardiovascular Disease

## 2023-09-20 ENCOUNTER — Ambulatory Visit: Payer: BC Managed Care – PPO | Attending: Cardiovascular Disease | Admitting: Cardiovascular Disease

## 2023-09-20 VITALS — BP 124/72 | HR 67 | Ht 70.6 in | Wt 193.4 lb

## 2023-09-20 DIAGNOSIS — I739 Peripheral vascular disease, unspecified: Secondary | ICD-10-CM

## 2023-09-20 DIAGNOSIS — I2510 Atherosclerotic heart disease of native coronary artery without angina pectoris: Secondary | ICD-10-CM

## 2023-09-20 NOTE — Patient Instructions (Signed)
Medication Instructions:  Your physician recommends that you continue on your current medications as directed. Please refer to the Current Medication list given to you today.  *If you need a refill on your cardiac medications before your next appointment, please call your pharmacy*   Testing/Procedures: Your physician has requested that you have a lower extremity arterial duplex. During this test, ultrasound is used to evaluate arterial blood flow in the legs. Allow one hour for this exam. There are no restrictions or special instructions. This will take place at [address] [facility], [address].  Your physician has requested that you have an ankle brachial index (ABI). During this test an ultrasound and blood pressure cuff are used to evaluate the arteries that supply the arms and legs with blood. Allow thirty minutes for this exam. There are no restrictions or special instructions. This will take place at [address] [facility], [address].     Follow-Up: At [facility], you and your health needs are our priority.  As part of our continuing mission to provide you with exceptional heart care, we have created designated Provider Care Teams.  These Care Teams include your primary Cardiologist (physician) and Advanced Practice Providers (APPs -  Physician Assistants and Nurse Practitioners) who all work together to provide you with the care you need, when you need it.  We recommend signing up for the patient portal called "MyChart".  Sign up information is provided on this After Visit Summary.  MyChart is used to connect with patients for Virtual Visits (Telemedicine).  Patients are able to view lab/test results, encounter notes, upcoming appointments, etc.  Non-urgent messages can be sent to your provider as well.   To learn more about what you can do with MyChart, go to [email or web address].    Your next appointment:   12 month(s)  Provider:   Nanetta Batty, MD

## 2023-09-20 NOTE — Assessment & Plan Note (Signed)
Mr. Steven Ford returns today for follow-up of his PAD.  I last saw him in the office/18/23.  He has had bilateral SFA stenting in the past.  His last peripheral angiogram and endovascular procedure I performed on him was 05/23/2020.  This revealed 50% "in-stent restenosis within the right SFA stent and 90 to 99% in-stent restenosis within the left SFA stent.  I performed laser atherectomy followed by drug-coated balloon angioplasty with an excellent result.  There was 0% residual at the end of the case.  His Dopplers normalized and his claudication resolved.  His most recent Doppler studies however performed 02/08/2022 revealed a right ABI of 1.13 and a left of 0.92.  He had a moderate lesion in his right popliteal artery and significant in-stent restenosis within his left SFA stent.  He had 1 brief episode of left foot numbness which reminded him of his preintervention symptoms but since that time he has had no recurrent symptoms of claudication.  I am going to repeat lower extremity arterial Doppler studies.

## 2023-09-20 NOTE — Progress Notes (Signed)
09/20/2023 [name]   [birth year]  [id number]  Primary Physician Sandre Kitty, MD Primary Cardiologist: Runell Gess MD Nicholes Calamity, [place]  HPI:  Steven Ford is a 62 y.o.   married, father of 2 boys, who referred to our cardiology clinic due to abnormal ABI.  I last saw him in the office 03/13/22.  He has Hx orf premature CV disease: CAD s/o CABG 2002 and L. SFA intervention/stent placement 2019. He was supposed to have a rt knee surgery, and was seen by his cardiologist for pre-op eval.  Lower extremities US showed decreased right ABI and he was referred to Dr. Allyson Sabal for evaluation for intervenention. 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 He denies any claudication. No C.P, no DOE, no palpitation. Of note, recent echo 12/21/2019 shower decreased EF of 40%, grade 2DD,  with global hypokinesia. Stress Myoview did not show evidence of ischemia.    I performed peripheral angiography on him 02/18/2020 revealing 80% diffuse in-stent restenosis within the previously placed left SFA stent 2 years ago.  I atherectomized and performed drug coated self-expanding stenting of 99% diffuse calcified right SFA stenosis with marked improvement in his symptoms and normalization of his Doppler studies.  He noticed sudden onset of numbness in his left foot approximately 2 months ago similar to his preintervention symptoms.  Based on this we decided to proceed with outpatient diagnostic peripheral angiography and endovascular therapy which was performed on 05/23/2020.  I performed laser atherectomy followed by drug-coated balloon angioplasty of high-grade left SFA "in-stent restenosis" with an excellent result.  He had 0% residual stenosis.  Dopplers normalized and his claudication resolved.  Interestingly, he was placed on Praluent as well and his total cholesterol came down to 75 and LDL 16.   Since I saw him in the office 1 year and a half ago  he continues to do well.  He did have left total hip replacement and right knee replacement which were uneventful.  He is followed otherwise by Dr. Bing Matter.  He denies chest pain or shortness of breath.  He had 1 brief episode of left foot numbness which has resolved.  He did have Dopplers performed 02/08/2022 revealing a right ABI 1.13 and a left of 0.92 with significant left SFA "in-stent restenosis".   Current Meds  Medication Sig   atorvastatin (LIPITOR) 80 MG tablet Take 1 tablet (80 mg total) by mouth daily.   Multiple Vitamins-Minerals (MULTIVITAMIN WITH MINERALS) tablet Take 1 tablet by mouth in the morning.   sacubitril-valsartan (ENTRESTO) 24-26 MG Take 1 tablet by mouth 2 (two) times daily. (Patient taking differently: Take 1 tablet by mouth daily.)     Allergies  Allergen Reactions   Influenza Vaccines Other (See Comments)    Pt gets sick or sicker with flu shot     Social History   Socioeconomic History   Marital status: Married    Spouse name: Not on file   Number of children: Not on file   Years of education: Not on file   Highest education level: Not on file  Occupational History   Not on file  Tobacco Use   Smoking status: Some Days    Types: Cigars   Smokeless tobacco: Never   Tobacco comments:    09/04/2018 "q time I play golf"  Vaping Use   Vaping status: Never Used  Substance and Sexual Activity   Alcohol use:  Yes    Alcohol/week: 2.0 standard drinks of alcohol    Types: 2 Cans of beer per week    Comment: occas.   Drug use: Not Currently   Sexual activity: Yes    Partners: Female  Other Topics Concern   Not on file  Social History Narrative   Not on file   Social Determinants of Health   Financial Resource Strain: Not on file  Food Insecurity: Not on file  Transportation Needs: Not on file  Physical Activity: Not on file  Stress: Not on file  Social Connections: Not on file  Intimate Partner Violence: Not on file     Review of  Systems: General: negative for chills, fever, night sweats or weight changes.  Cardiovascular: negative for chest pain, dyspnea on exertion, edema, orthopnea, palpitations, paroxysmal nocturnal dyspnea or shortness of breath Dermatological: negative for rash Respiratory: negative for cough or wheezing Urologic: negative for hematuria Abdominal: negative for nausea, vomiting, diarrhea, bright red blood per rectum, melena, or hematemesis Neurologic: negative for visual changes, syncope, or dizziness All other systems reviewed and are otherwise negative except as noted above.    Blood pressure 124/72, pulse 67, height 5' 10.6" (1.793 m), weight 193 lb 6.4 oz (87.7 kg), SpO2 96%.  General appearance: alert and no distress Neck: no adenopathy, no carotid bruit, no JVD, supple, symmetrical, trachea midline, and thyroid not enlarged, symmetric, no tenderness/mass/nodules Lungs: clear to auscultation bilaterally Heart: regular rate and rhythm, S1, S2 normal, no murmur, click, rub or gallop Extremities: extremities normal, atraumatic, no cyanosis or edema Pulses: 2+ and symmetric Skin: Skin color, texture, turgor normal. No rashes or lesions Neurologic: Grossly normal  EKG EKG Interpretation Date/Time:  Friday September 20 2023 15:24:46 EDT Ventricular Rate:  67 PR Interval:  164 QRS Duration:  104 QT Interval:  390 QTC Calculation: 412 R Axis:   62  Text Interpretation: Normal sinus rhythm T wave abnormality, consider inferolateral ischemia When compared with ECG of 30-Jan-2001 07:29, ST no longer elevated in Anterolateral leads T wave inversion less evident in Anterior leads Confirmed by Nanetta Batty [phone]) on 09/20/2023 3:35:58 PM    ASSESSMENT AND PLAN:   PVD (peripheral vascular disease) [facility]) Mr. Steven Ford returns today for follow-up of his PAD.  I last saw him in the office/18/23.  He has had bilateral SFA stenting in the past.  His last peripheral angiogram and endovascular  procedure I performed on him was 05/23/2020.  This revealed 50% "in-stent restenosis within the right SFA stent and 90 to 99% in-stent restenosis within the left SFA stent.  I performed laser atherectomy followed by drug-coated balloon angioplasty with an excellent result.  There was 0% residual at the end of the case.  His Dopplers normalized and his claudication resolved.  His most recent Doppler studies however performed 02/08/2022 revealed a right ABI of 1.13 and a left of 0.92.  He had a moderate lesion in his right popliteal artery and significant in-stent restenosis within his left SFA stent.  He had 1 brief episode of left foot numbness which reminded him of his preintervention symptoms but since that time he has had no recurrent symptoms of claudication.  I am going to repeat lower extremity arterial Doppler studies.     Runell Gess MD FACP,FACC,FAHA, [facility] 09/20/2023 3:49 PM

## 2023-10-10 ENCOUNTER — Ambulatory Visit (HOSPITAL_COMMUNITY)
Admission: RE | Admit: 2023-10-10 | Discharge: 2023-10-10 | Disposition: A | Discharge status: Home / Self Care | Payer: BC Managed Care – PPO | Source: Ambulatory Visit | Attending: Cardiology | Admitting: Cardiology

## 2023-10-10 ENCOUNTER — Other Ambulatory Visit (HOSPITAL_COMMUNITY): Payer: Self-pay

## 2023-10-10 DIAGNOSIS — I739 Peripheral vascular disease, unspecified: Secondary | ICD-10-CM | POA: Insufficient documentation

## 2023-10-10 LAB — VAS US ABI WITH/WO TBI
Left ABI: 0.96
Right ABI: 0.85

## 2023-12-04 ENCOUNTER — Telehealth: Payer: Self-pay | Admitting: Cardiology

## 2023-12-04 ENCOUNTER — Other Ambulatory Visit (HOSPITAL_COMMUNITY): Payer: Self-pay

## 2023-12-04 MED ORDER — ENTRESTO 24-26 MG PO TABS
1.0000 | ORAL_TABLET | Freq: Two times a day (BID) | ORAL | 3 refills | Status: DC
  Filled 2023-12-04: qty 180, 90d supply, fill #0

## 2023-12-04 MED ORDER — ENTRESTO 24-26 MG PO TABS
1.0000 | ORAL_TABLET | Freq: Two times a day (BID) | ORAL | 0 refills | Status: DC
  Filled 2023-12-04: qty 180, 90d supply, fill #0

## 2023-12-04 NOTE — Telephone Encounter (Signed)
*  STAT* If patient is at the pharmacy, call can be transferred to refill team.   1. Which medications need to be refilled? (please list name of each medication and dose if known)   sacubitril -valsartan  (ENTRESTO ) 24-26 MG    2. Which pharmacy/location (including street and city if local pharmacy) is medication to be sent to? [id number] [place] [place] - [address]    3. Do they need a 30 day or 90 day supply? 30

## 2023-12-04 NOTE — Addendum Note (Signed)
 Addended by: Lonia Farber on: 12/04/2023 02:02 PM   Modules accepted: Orders

## 2023-12-09 ENCOUNTER — Telehealth: Payer: Self-pay | Admitting: Cardiology

## 2023-12-09 ENCOUNTER — Other Ambulatory Visit: Payer: Self-pay

## 2023-12-09 ENCOUNTER — Other Ambulatory Visit (HOSPITAL_COMMUNITY): Payer: Self-pay

## 2023-12-09 MED ORDER — ENTRESTO 24-26 MG PO TABS: 1.0000 | ORAL_TABLET | Freq: Two times a day (BID) | ORAL | 0 refills | Status: DC

## 2023-12-09 NOTE — Telephone Encounter (Signed)
*  STAT* If patient is at the pharmacy, call can be transferred to refill team.   1. Which medications need to be refilled? (please list name of each medication and dose if known) Entresto    2. Would you like to learn more about the convenience, safety, & potential cost savings by using the [facility]?     3. Are you open to using the [facility] (Type [facility].   4. Which pharmacy/location (including street and city if local pharmacy) is medication to be sent to?[facility] [address] [place]   5. Do they need a 30 day or 90 day supply? 90 days and refills- please call in today- out of medicine It was called to the wrong pharmacy

## 2023-12-09 NOTE — Telephone Encounter (Signed)
 [facility]resto sent to [facility]  per pt request

## 2023-12-10 ENCOUNTER — Ambulatory Visit: Payer: Self-pay | Admitting: Cardiology

## 2023-12-12 NOTE — Progress Notes (Signed)
Cardiology Office Note:  .   Date:  12/13/2023  ID:  Steven Ford, DOB 1961-08-03, MRN [id number] PCP: Sandre Kitty, MD   HeartCare Providers Cardiologist:  Gypsy Balsam, MD    History of Present Illness: .   Steven Ford is a 63 y.o. male with a past medical history of CAD s/p CABG x 3 2002, HFrEF with improved EF, PVD s/p arthrectomy on left SFA--follows with Dr. Allyson Sabal, migraines, hypertension, dyslipidemia.  10/10/2023 vascular ultrasound ABI's with and without left ABIs appear increased, left TBI unchanged 03/28/2023 echo EF 50 to 55%, mild concentric LVH, grade 1 DD, LA mildly dilated, mild MR 07/12/2022 echo EF 55 to 60%, grade 1 DD 02/08/2022 Lexiscan no ischemia, intermediate risk secondary to reduction of global function EF 39% 12/16/2020 carotid duplex no stenosis in right ICA, mild stenosis in left ICA 2019 SFA intervention s/p stent 2002 CABG x 3   Most recently evaluated by Dr. Bing Matter on 01/31/2023, he was stable from a cardiac perspective and advised to follow-up in 6 months.  He presents today for follow-up of his CAD.  He has been doing well from a cardiac perspective, he offers no formal complaints.  Since he was last evaluated in our office he underwent a total right knee arthroplasty, recovered completely from that.  He stays very physically active throughout the day at work, works at the local hospital.He denies chest pain, palpitations, dyspnea, pnd, orthopnea, n, v, dizziness, syncope, edema, weight gain, or early satiety.    ROS: Review of Systems  All other systems reviewed and are negative.    Studies Reviewed: .        Cardiac Studies & Procedures     STRESS TESTS  MYOCARDIAL PERFUSION IMAGING 02/08/2022  Narrative   Findings are consistent with no ischemia and no prior myocardial infarction. The study is intermediate risk.   No ST deviation was noted.   Left ventricular function is abnormal. Global function is moderately  reduced. Nuclear stress EF: 39 %. The left ventricular ejection fraction is moderately decreased (30-44%). End diastolic cavity size is normal.   Prior study available for comparison from 02/12/2020.  ECHOCARDIOGRAM  ECHOCARDIOGRAM COMPLETE 03/28/2023  Narrative ECHOCARDIOGRAM REPORT    Patient Name:   Steven Ford Date of Exam: 03/28/2023 Medical Rec #:  [id number]           Height:       70.5 in Accession #:    [id number]          Weight:       200.0 lb Date of Birth:  [birth year]            BSA:          2.098 m Patient Age:    62 years            BP:           130/90 mmHg Patient Gender: M                   HR:           67 bpm. Exam Location:  [facility]  Procedure: 2D Echo, Cardiac Doppler, Color Doppler and Strain Analysis  Indications:    Dyspnea on exertion [R06.09 (ICD-10-CM)]  History:        Patient has prior history of Echocardiogram examinations, most recent 07/12/2022. CAD; Risk Factors:Hypertension and Dyslipidemia.  Sonographer:    Louie Boston RDCS Referring Phys: [phone] Georgeanna Lea  IMPRESSIONS   1. Left ventricular ejection fraction, by estimation, is 50 to 55%. Left ventricular ejection fraction by 2D MOD biplane is 51.6 %. The left ventricle has low normal function. The left ventricle has no regional wall motion abnormalities. There is mild concentric left ventricular hypertrophy. Left ventricular diastolic parameters are consistent with Grade I diastolic dysfunction (impaired relaxation). 2. TAPSE 1.5 cm. Right ventricular systolic function is mildly reduced. The right ventricular size is normal. Tricuspid regurgitation signal is inadequate for assessing PA pressure. 3. Left atrial size was mildly dilated. 4. The mitral valve is normal in structure. Mild mitral valve regurgitation. No evidence of mitral stenosis. 5. The aortic valve is tricuspid. Aortic valve regurgitation is not visualized. No aortic stenosis is present. 6. The inferior vena cava is  normal in size with greater than 50% respiratory variability, suggesting right atrial pressure of 3 mmHg.  FINDINGS Left Ventricle: Left ventricular ejection fraction, by estimation, is 50 to 55%. Left ventricular ejection fraction by 2D MOD biplane is 51.6 %. The left ventricle has low normal function. The left ventricle has no regional wall motion abnormalities. The left ventricular internal cavity size was normal in size. There is mild concentric left ventricular hypertrophy. Left ventricular diastolic parameters are consistent with Grade I diastolic dysfunction (impaired relaxation).  Right Ventricle: TAPSE 1.5 cm. The right ventricular size is normal. No increase in right ventricular wall thickness. Right ventricular systolic function is mildly reduced. Tricuspid regurgitation signal is inadequate for assessing PA pressure. The tricuspid regurgitant velocity is 1.81 m/s, and with an assumed right atrial pressure of 3 mmHg, the estimated right ventricular systolic pressure is 16.1 mmHg.  Left Atrium: Left atrial size was mildly dilated.  Right Atrium: Right atrial size was normal in size.  Pericardium: There is no evidence of pericardial effusion.  Mitral Valve: The mitral valve is normal in structure. Mild mitral valve regurgitation. No evidence of mitral valve stenosis.  Tricuspid Valve: The tricuspid valve is normal in structure. Tricuspid valve regurgitation is trivial. No evidence of tricuspid stenosis.  Aortic Valve: The aortic valve is tricuspid. Aortic valve regurgitation is not visualized. No aortic stenosis is present.  Pulmonic Valve: The pulmonic valve was normal in structure. Pulmonic valve regurgitation is mild. No evidence of pulmonic stenosis.  Aorta: The aortic arch was not well visualized and the aortic root and ascending aorta are structurally normal, with no evidence of dilitation.  Venous: A systolic blunting flow pattern is recorded from the right upper pulmonary  vein. The inferior vena cava is normal in size with greater than 50% respiratory variability, suggesting right atrial pressure of 3 mmHg.  IAS/Shunts: No atrial level shunt detected by color flow Doppler.   LEFT VENTRICLE PLAX 2D                        Biplane EF (MOD) LVIDd:         5.30 cm         LV Biplane EF:   Left LVIDs:         4.00 cm                          ventricular LV PW:         1.20 cm                          ejection LV IVS:  1.20 cm                          fraction by LVOT diam:     2.20 cm                          2D MOD LV SV:         68                               biplane is LV SV Index:   32                               51.6 %. LVOT Area:     3.80 cm Diastology LV e' medial:    7.83 cm/s LV Volumes (MOD)               LV E/e' medial:  10.9 LV vol d, MOD    131.0 ml      LV e' lateral:   12.10 cm/s A2C:                           LV E/e' lateral: 7.1 LV vol d, MOD    133.0 ml A4C: LV vol s, MOD    58.6 ml A2C: LV vol s, MOD    76.2 ml A4C: LV SV MOD A2C:   72.4 ml LV SV MOD A4C:   133.0 ml LV SV MOD BP:    70.9 ml  RIGHT VENTRICLE            IVC RV Basal diam:  3.40 cm    IVC diam: 1.60 cm RV S prime:     8.59 cm/s TAPSE (M-mode): 1.5 cm  LEFT ATRIUM             Index        RIGHT ATRIUM           Index LA diam:        4.20 cm 2.00 cm/m   RA Area:     19.60 cm LA Vol (A2C):   74.3 ml 35.41 ml/m  RA Volume:   56.00 ml  26.69 ml/m LA Vol (A4C):   62.5 ml 29.79 ml/m LA Biplane Vol: 73.3 ml 34.93 ml/m AORTIC VALVE LVOT Vmax:   79.40 cm/s LVOT Vmean:  52.675 cm/s LVOT VTI:    0.179 m  AORTA Ao Root diam: 3.40 cm Ao Asc diam:  3.70 cm Ao Desc diam: 2.90 cm  MV E velocity: 85.40 cm/s  TRICUSPID VALVE MV A velocity: 96.40 cm/s  TR Peak grad:   13.1 mmHg MV E/A ratio:  0.89        TR Vmax:        181.00 cm/s  SHUNTS Systemic VTI:  0.18 m Systemic Diam: 2.20 cm  Norman Herrlich MD Electronically signed by Norman Herrlich MD Signature  Date/Time: 03/28/2023/12:13:03 PM    Final             Risk Assessment/Calculations:             Physical Exam:   VS:  BP 118/74 (BP Location: Right Arm, Patient Position: Sitting, Cuff Size: Normal)   Pulse 60   Ht 5' 10.6" (1.793 m)   Wt 203 lb (92.1  kg)   SpO2 94%   BMI 28.63 kg/m    Wt Readings from Last 3 Encounters:  12/13/23 203 lb (92.1 kg)  09/20/23 193 lb 6.4 oz (87.7 kg)  04/26/23 202 lb (91.6 kg)    GEN: Well nourished, well developed in no acute distress NECK: No JVD; No carotid bruits CARDIAC: RRR, no murmurs, rubs, gallops RESPIRATORY:  Clear to auscultation without rales, wheezing or rhonchi  ABDOMEN: Soft, non-tender, non-distended EXTREMITIES:  No edema; No deformity   ASSESSMENT AND PLAN: .   CAD -s/p CABG x 3 in 2002 >> Lexiscan 01/2022 negative for ischemia. Stable with no anginal symptoms. No indication for ischemic evaluation.  Continue aspirin 81 mg daily, continue Lipitor 80 mg daily.Heart healthy diet and regular cardiovascular exercise encouraged.    HFrEF with improved ejection fraction-NYHA class I, euvolemic.  Continue Entresto 24-26 mg daily.  Not on any further GDMT at this time.  PVD/PAD-follows with Dr. Allyson Sabal, has a recall for 10/25.  Dyslipidemia-will repeat FLP and LFTs, currently on Lipitor 80 mg daily-appears he was on Zetia 10 mg daily but is no longer taking this.  Carotid artery stenosis-no bruits appreciated.         Dispo: CMET, FLP, follow-up general cardiology in 6 months.  Signed, Flossie Dibble, NP

## 2023-12-13 ENCOUNTER — Encounter: Payer: Self-pay | Admitting: Cardiology

## 2023-12-13 ENCOUNTER — Other Ambulatory Visit (HOSPITAL_COMMUNITY): Payer: Self-pay

## 2023-12-13 ENCOUNTER — Ambulatory Visit: Payer: 59 | Attending: Cardiology | Admitting: Cardiology

## 2023-12-13 VITALS — BP 118/74 | HR 60 | Ht 70.6 in | Wt 203.0 lb

## 2023-12-13 DIAGNOSIS — I10 Essential (primary) hypertension: Secondary | ICD-10-CM

## 2023-12-13 DIAGNOSIS — I6529 Occlusion and stenosis of unspecified carotid artery: Secondary | ICD-10-CM

## 2023-12-13 DIAGNOSIS — I2510 Atherosclerotic heart disease of native coronary artery without angina pectoris: Secondary | ICD-10-CM | POA: Diagnosis not present

## 2023-12-13 DIAGNOSIS — I739 Peripheral vascular disease, unspecified: Secondary | ICD-10-CM

## 2023-12-13 DIAGNOSIS — E785 Hyperlipidemia, unspecified: Secondary | ICD-10-CM | POA: Diagnosis not present

## 2023-12-13 NOTE — Patient Instructions (Signed)
Medication Instructions:  Your physician recommends that you continue on your current medications as directed. Please refer to the Current Medication list given to you today.  *If you need a refill on your cardiac medications before your next appointment, please call your pharmacy*   Lab Work: Your physician recommends that you return for lab work in: Next week for a CMP and Fasting Lipid Panel Lab opens at 8am. You DO NOT NEED an appointment. Best time to come is between 8am and 11:45 and between 1:30 and 4:30. If you have been asked to fast for your blood work please have nothing to eat or drink after midnight. You may have water.   If you have labs (blood work) drawn today and your tests are completely normal, you will receive your results only by: MyChart Message (if you have MyChart) OR A paper copy in the mail If you have any lab test that is abnormal or we need to change your treatment, we will call you to review the results.   Testing/Procedures: NONE   Follow-Up: At [facility], you and your health needs are our priority.  As part of our continuing mission to provide you with exceptional heart care, we have created designated Provider Care Teams.  These Care Teams include your primary Cardiologist (physician) and Advanced Practice Providers (APPs -  Physician Assistants and Nurse Practitioners) who all work together to provide you with the care you need, when you need it.  We recommend signing up for the patient portal called "MyChart".  Sign up information is provided on this After Visit Summary.  MyChart is used to connect with patients for Virtual Visits (Telemedicine).  Patients are able to view lab/test results, encounter notes, upcoming appointments, etc.  Non-urgent messages can be sent to your provider as well.   To learn more about what you can do with MyChart, go to [email or web address].    Your next appointment:   6 month(s)  Provider:   Gypsy Balsam, MD    Other Instructions

## 2023-12-26 ENCOUNTER — Other Ambulatory Visit: Payer: Self-pay | Admitting: Cardiology

## 2024-03-10 ENCOUNTER — Other Ambulatory Visit: Payer: Self-pay | Admitting: Cardiology

## 2024-06-19 ENCOUNTER — Telehealth: Payer: Self-pay | Admitting: Cardiology

## 2024-06-19 ENCOUNTER — Telehealth: Payer: Self-pay | Admitting: Pharmacy Technician

## 2024-06-19 MED ORDER — SACUBITRIL-VALSARTAN 24-26 MG PO TABS: 1.0000 | ORAL_TABLET | Freq: Two times a day (BID) | ORAL | 1 refills | Status: DC

## 2024-06-19 NOTE — Telephone Encounter (Signed)
*  STAT* If patient is at the pharmacy, call can be transferred to refill team.   1. Which medications need to be refilled? (please list name of each medication and dose if known) ENTRESTO  24-26 MG   2. Which pharmacy/location (including street and city if local pharmacy) is medication to be sent to?  [id number] [place] [place] - [address]    3. Do they need a 30 day or 90 day supply? 90

## 2024-06-19 NOTE — Telephone Encounter (Signed)
 RX sent to requested Pharmacy

## 2024-06-19 NOTE — Telephone Encounter (Addendum)
   Pharmacy Patient Advocate Encounter   Received notification from Onbase that prior authorization for entresto  24/26mg  is required/requested.   Insurance verification completed.   The patient is insured through [facility]   Per test claim: PA required; PA submitted to above mentioned insurance via latent Key/confirmation #/EOC [id number] Status is pending

## 2024-06-22 NOTE — Telephone Encounter (Signed)
 Submitted in new pa [id number]: Left ventricular diastolic parameters are consistent with Grade I diastolic dysfunction (impaired relaxation)

## 2024-06-22 NOTE — Telephone Encounter (Signed)
 Pharmacy Patient Advocate Encounter  Received notification from [facility] that Prior Authorization for entresto  has been APPROVED from 06/22/24 to 06/22/25   PA #/Case ID/Reference #: [id number]

## 2024-06-28 ENCOUNTER — Other Ambulatory Visit: Payer: Self-pay | Admitting: Cardiology

## 2024-10-07 ENCOUNTER — Other Ambulatory Visit: Payer: Self-pay | Admitting: Cardiology

## 2024-10-09 ENCOUNTER — Ambulatory Visit (HOSPITAL_COMMUNITY)
Admission: RE | Admit: 2024-10-09 | Discharge: 2024-10-09 | Disposition: A | Discharge status: Home / Self Care | Source: Ambulatory Visit | Attending: Cardiovascular Disease | Admitting: Cardiovascular Disease

## 2024-10-09 DIAGNOSIS — I739 Peripheral vascular disease, unspecified: Secondary | ICD-10-CM

## 2024-10-10 ENCOUNTER — Ambulatory Visit: Payer: Self-pay | Admitting: Cardiovascular Disease

## 2024-10-10 DIAGNOSIS — I739 Peripheral vascular disease, unspecified: Secondary | ICD-10-CM

## 2024-10-10 LAB — VAS US ABI WITH/WO TBI
Left ABI: 0.95
Right ABI: 0.82

## 2024-11-27 ENCOUNTER — Encounter: Payer: Self-pay | Admitting: Cardiology

## 2024-11-27 ENCOUNTER — Ambulatory Visit: Attending: Cardiology | Admitting: Cardiology

## 2024-11-27 VITALS — BP 130/76 | HR 59 | Ht 71.0 in | Wt 198.4 lb

## 2024-11-27 DIAGNOSIS — E782 Mixed hyperlipidemia: Secondary | ICD-10-CM

## 2024-11-27 DIAGNOSIS — I739 Peripheral vascular disease, unspecified: Secondary | ICD-10-CM

## 2024-11-27 DIAGNOSIS — Z951 Presence of aortocoronary bypass graft: Secondary | ICD-10-CM | POA: Diagnosis not present

## 2024-11-27 DIAGNOSIS — N529 Male erectile dysfunction, unspecified: Secondary | ICD-10-CM | POA: Diagnosis not present

## 2024-11-27 DIAGNOSIS — E785 Hyperlipidemia, unspecified: Secondary | ICD-10-CM | POA: Diagnosis not present

## 2024-11-27 DIAGNOSIS — R0609 Other forms of dyspnea: Secondary | ICD-10-CM

## 2024-11-27 DIAGNOSIS — N5201 Erectile dysfunction due to arterial insufficiency: Secondary | ICD-10-CM

## 2024-11-27 MED ORDER — SACUBITRIL-VALSARTAN 24-26 MG PO TABS: 1.0000 | ORAL_TABLET | Freq: Two times a day (BID) | ORAL | 3 refills | Status: AC

## 2024-11-27 NOTE — Progress Notes (Signed)
 " Cardiology Office Note:    Date:  11/27/2024   ID:  Steven Ford, DOB 08-29-1961, MRN [id number]  PCP:  Chandra Toribio POUR, MD  Cardiologist:  Lamar Fitch, MD    Referring MD: Chandra Toribio POUR, MD   No chief complaint on file. Doing fine  History of Present Illness:    Steven Ford is a 64 y.o. male   past medical history significant for coronary artery disease, status post coronary artery bypass graft years ago, cardiomyopathy with ejection fraction labral of 20%, peripheral vascular disease status post arthrectomy done on left SFA IM in March 2021 but then he required 3 intervention the summer at that time laser was performed and drug-eluting stent was implanted.  He does have dyslipidemia.  Comes today to months for follow-up overall doing great.  Since I seen him last time he did have some intervention on his arteries in the leg doing great can exercise knee replacement hip replacement done exercising on the regular basis with no difficulties denies having any chest pain tightness squeezing pressure mid chest no palpitation dizziness swelling of lower extremities, no claudications  Past Medical History:  Diagnosis Date   Cardiomyopathy (HCC) 02/12/2020   Echo 12/21/2019 showed decreased EF of 40%, grade 2DD, with global hypokinesia. Stress Myoview  did not show evidence of ischemia.   Claudication in peripheral vascular disease 07/15/2018   Peripheral arterial disease   Complication of anesthesia    Coronary artery disease    Coronary artery disease involving native coronary artery of native heart without angina pectoris 11/01/2015   Dyslipidemia 11/01/2015   Hyperlipidemia   Encounter to establish care 03/07/2021   High cholesterol    Hyperlipidemia    Hypertension    Migraine    none in a long long while (09/04/2018)   PONV (postoperative nausea and vomiting)    Preop cardiovascular exam 12/11/2019   Primary osteoarthritis of right knee 03/07/2021   PVD  (peripheral vascular disease)    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    Past Surgical History:  Procedure Laterality Date   ABDOMINAL AORTOGRAM W/LOWER EXTREMITY  02/18/2020   ABDOMINAL AORTOGRAM W/LOWER EXTREMITY    ABDOMINAL AORTOGRAM W/LOWER EXTREMITY Right 02/18/2020   Procedure: ABDOMINAL AORTOGRAM W/LOWER EXTREMITY;  Surgeon: Court Dorn PARAS, MD;  Location: [facility];  Service: Cardiovascular;  Laterality: Right;   ABDOMINAL AORTOGRAM W/LOWER EXTREMITY Right 05/23/2020   Procedure: ABDOMINAL AORTOGRAM W/LOWER EXTREMITY;  Surgeon: Court Dorn PARAS, MD;  Location: [facility];  Service: Cardiovascular;  Laterality: Right;   ACHILLES TENDON REPAIR Right 2010   CORONARY ARTERY BYPASS GRAFT  2002   triple   KNEE ARTHROSCOPY Right 2019   LOWER EXTREMITY ANGIOGRAPHY N/A 08/28/2018   Procedure: LOWER EXTREMITY ANGIOGRAPHY;  Surgeon: Court Dorn PARAS, MD;  Location: [facility];  Service: Cardiovascular;  Laterality: N/A;   LOWER EXTREMITY ANGIOGRAPHY N/A 09/04/2018   Procedure: LOWER EXTREMITY ANGIOGRAPHY;  Surgeon: Court Dorn PARAS, MD;  Location: [facility];  Service: Cardiovascular;  Laterality: N/A;   PERIPHERAL VASCULAR ATHERECTOMY  02/18/2020   Procedure: PERIPHERAL VASCULAR ATHERECTOMY;  Surgeon: Court Dorn PARAS, MD;  Location: [facility];  Service: Cardiovascular;;  rt SFA   PERIPHERAL VASCULAR ATHERECTOMY  05/23/2020   Procedure: PERIPHERAL VASCULAR ATHERECTOMY;  Surgeon: Court Dorn PARAS, MD;  Location: [facility] IN[facility] CV [facility];  Service: Cardiovascular;;  Laser-Left SFA   PERIPHERAL VASCULAR BALLOON ANGIOPLASTY  05/23/2020   Procedure: PERIPHERAL VASCULAR BALLOON ANGIOPLASTY;  Surgeon: Court Dorn PARAS, MD;  Location: [facility];  Service: Cardiovascular;;  Left SFA   PERIPHERAL VASCULAR INTERVENTION Left 09/04/2018   PERIPHERAL VASCULAR INTERVENTION Left 09/04/2018    Procedure: PERIPHERAL VASCULAR INTERVENTION;  Surgeon: Court Dorn PARAS, MD;  Location: [facility];  Service: Cardiovascular;  Laterality: Left;   PERIPHERAL VASCULAR INTERVENTION  02/18/2020   Procedure: PERIPHERAL VASCULAR INTERVENTION;  Surgeon: Court Dorn PARAS, MD;  Location: [facility];  Service: Cardiovascular;;  rt SFA   TOTAL HIP ARTHROPLASTY Left 04/25/2022   Procedure: TOTAL HIP ARTHROPLASTY;  Surgeon: Edna Toribio LABOR, MD;  Location: [facility];  Service: Orthopedics;  Laterality: Left;   TOTAL KNEE ARTHROPLASTY Right 04/26/2023   Procedure: TOTAL KNEE ARTHROPLASTY;  Surgeon: Edna Toribio LABOR, MD;  Location: [facility];  Service: Orthopedics;  Laterality: Right;    Current Medications: Active Medications[1]   Allergies:   Influenza vaccines   Social History   Socioeconomic History   Marital status: Married    Spouse name: Not on file   Number of children: Not on file   Years of education: Not on file   Highest education level: Not on file  Occupational History   Not on file  Tobacco Use   Smoking status: Some Days    Types: Cigars   Smokeless tobacco: Never   Tobacco comments:    09/04/2018 q time I play golf  Vaping Use   Vaping status: Never Used  Substance and Sexual Activity   Alcohol  use: Yes    Alcohol /week: 2.0 standard drinks of alcohol     Types: 2 Cans of beer per week    Comment: occas.   Drug use: Not Currently   Sexual activity: Yes    Partners: Female  Other Topics Concern   Not on file  Social History Narrative   Not on file   Social Drivers of Health   Tobacco Use: High Risk (11/27/2024)   Patient History    Smoking Tobacco Use: Some Days    Smokeless Tobacco Use: Never    Passive Exposure: Not on file  Financial Resource Strain: Not on file  Food Insecurity: Not on file  Transportation Needs: Not on file  Physical Activity: Not on file  Stress: Not on file  Social Connections: Not on file  Depression (PHQ2-9):  Low Risk (01/09/2023)   Depression (PHQ2-9)    PHQ-2 Score: 0  Alcohol  Screen: Not on file  Housing: Not on file  Utilities: Not on file  Health Literacy: Not on file     Family History: The patient's family history includes Anemia in his father; Heart disease in his mother; High blood pressure in his mother. ROS:   Please see the history of present illness.    All 14 point review of systems negative except as described per history of present illness  EKGs/Labs/Other Studies Reviewed:    EKG Interpretation Date/Time:  Friday November 27 2024 13:01:30 EST Ventricular Rate:  59 PR Interval:  180 QRS Duration:  108 QT Interval:  428 QTC Calculation: 423 R Axis:   76  Text Interpretation: Sinus bradycardia Cannot rule out Inferior infarct , age undetermined Cannot rule out Anterior infarct , age undetermined T wave abnormality, consider lateral ischemia Abnormal ECG When compared with ECG of 20-Sep-2023 15:24, Nonspecific T wave abnormality has replaced inverted T waves in Anterior leads Confirmed by Bernie Charleston [phone]) on 11/27/2024 1:13:58 PM  Recent Labs: No results found for requested labs within last 365 days.  Recent Lipid Panel    Component Value Date/Time   CHOL 127 07/06/2022 0846   TRIG 52 07/06/2022 0846   HDL 40 07/06/2022 0846   CHOLHDL 3.2 07/06/2022 0846   LDLCALC 75 07/06/2022 0846   LDLDIRECT 79 02/02/2022 0853    Physical Exam:    VS:  BP 130/76   Pulse (!) 59   Ht 5' 11 (1.803 m)   Wt 198 lb 6.4 oz (90 kg)   SpO2 95%   BMI 27.67 kg/m     Wt Readings from Last 3 Encounters:  11/27/24 198 lb 6.4 oz (90 kg)  12/13/23 203 lb (92.1 kg)  09/20/23 193 lb 6.4 oz (87.7 kg)     GEN:  Well nourished, well developed in no acute distress HEENT: Normal NECK: No JVD; No carotid bruits LYMPHATICS: No lymphadenopathy CARDIAC: RRR, no murmurs, no rubs, no gallops RESPIRATORY:  Clear to auscultation without rales, wheezing or rhonchi  ABDOMEN: Soft,  non-tender, non-distended MUSCULOSKELETAL:  No edema; No deformity  SKIN: Warm and dry LOWER EXTREMITIES: no swelling NEUROLOGIC:  Alert and oriented x 3 PSYCHIATRIC:  Normal affect   ASSESSMENT:    1. Dyslipidemia   2. Erectile dysfunction, unspecified erectile dysfunction type   3. Status post coronary artery bypass graft   4. Erectile dysfunction due to arterial insufficiency   5. Mixed hyperlipidemia   6. PVD (peripheral vascular disease)    PLAN:    In order of problems listed above:  Coronary disease stable from that point we will continue present management.  I have to make sure that he takes aspirin  Dyslipidemia we will check his fasting lipid profile. Peripheral vascular disease stable last other issue no new changes asymptomatic continue present management. Erectile dysfunction, will be referred to urologist.   Medication Adjustments/Labs and Tests Ordered: Current medicines are reviewed at length with the patient today.  Concerns regarding medicines are outlined above.  Orders Placed This Encounter  Procedures   Ambulatory referral to Urology   EKG 12-Lead   Medication changes: No orders of the defined types were placed in this encounter.   Signed, Lamar DOROTHA Fitch, MD, [facility] 11/27/2024 1:23 PM    [facility]    [1]  Current Meds  Medication Sig   atorvastatin  (LIPITOR) 80 MG tablet TAKE 1 TABLET BY MOUTH EVERY DAY   Multiple Vitamins-Minerals (MULTIVITAMIN WITH MINERALS) tablet Take 1 tablet by mouth in the morning.   naproxen (NAPROSYN) 500 MG tablet Take 500 mg by mouth as needed for mild pain (pain score 1-3) or moderate pain (pain score 4-6).   sacubitril -valsartan  (ENTRESTO ) 24-26 MG Take 1 tablet by mouth 2 (two) times daily.   "

## 2024-11-27 NOTE — Patient Instructions (Addendum)
 Medication Instructions:  Your physician recommends that you continue on your current medications as directed. Please refer to the Current Medication list given to you today.  *If you need a refill on your cardiac medications before your next appointment, please call your pharmacy*   Lab Work: Your physician recommends that you return for lab work in: when fasting You need to have labs done when you are fasting.  You can come Monday through Friday 8:30 am to 12:00 pm and 1:15 to 4:30. You do not need to make an appointment as the order has already been placed.     Testing/Procedures: Your physician has requested that you have an echocardiogram. Echocardiography is a painless test that uses sound waves to create images of your heart. It provides your doctor with information about the size and shape of your heart and how well your hearts chambers and valves are working. This procedure takes approximately one hour. There are no restrictions for this procedure. Please do NOT wear cologne, perfume, aftershave, or lotions (deodorant is allowed). Please arrive 15 minutes prior to your appointment time.  Please note: We ask at that you not bring children with you during ultrasound (echo/ vascular) testing. Due to room size and safety concerns, children are not allowed in the ultrasound rooms during exams. Our front office staff cannot provide observation of children in our lobby area while testing is being conducted. An adult accompanying a patient to their appointment will only be allowed in the ultrasound room at the discretion of the ultrasound technician under special circumstances. We apologize for any inconvenience.    Follow-Up: At [facility], you and your health needs are our priority.  As part of our continuing mission to provide you with exceptional heart care, we have created designated Provider Care Teams.  These Care Teams include your primary Cardiologist (physician) and Advanced  Practice Providers (APPs -  Physician Assistants and Nurse Practitioners) who all work together to provide you with the care you need, when you need it.  We recommend signing up for the patient portal called MyChart.  Sign up information is provided on this After Visit Summary.  MyChart is used to connect with patients for Virtual Visits (Telemedicine).  Patients are able to view lab/test results, encounter notes, upcoming appointments, etc.  Non-urgent messages can be sent to your provider as well.   To learn more about what you can do with MyChart, go to [email or web address].    Your next appointment:   6 month(s)  The format for your next appointment:   In Person  Provider:   Lamar Fitch, MD    Other Instructions Referral to Dr. Marda [address] [place], [place] [phone]

## 2024-12-23 ENCOUNTER — Ambulatory Visit: Attending: Cardiology

## 2024-12-29 ENCOUNTER — Encounter (HOSPITAL_COMMUNITY): Payer: Self-pay | Admitting: Cardiology
